# Patient Record
Sex: Female | Born: 1955
Health system: Southern US, Community
[De-identification: ages and names within clinical notes are randomized; demographics above are authoritative.]

## PROBLEM LIST (undated history)

## (undated) DIAGNOSIS — E785 Hyperlipidemia, unspecified: Secondary | ICD-10-CM

## (undated) DIAGNOSIS — I10 Essential (primary) hypertension: Secondary | ICD-10-CM

## (undated) DIAGNOSIS — F329 Major depressive disorder, single episode, unspecified: Secondary | ICD-10-CM

## (undated) DIAGNOSIS — Z9889 Other specified postprocedural states: Secondary | ICD-10-CM

## (undated) DIAGNOSIS — H269 Unspecified cataract: Secondary | ICD-10-CM

## (undated) DIAGNOSIS — T7840XA Allergy, unspecified, initial encounter: Secondary | ICD-10-CM

## (undated) DIAGNOSIS — E119 Type 2 diabetes mellitus without complications: Secondary | ICD-10-CM

## (undated) DIAGNOSIS — R112 Nausea with vomiting, unspecified: Secondary | ICD-10-CM

## (undated) DIAGNOSIS — M1990 Unspecified osteoarthritis, unspecified site: Secondary | ICD-10-CM

## (undated) DIAGNOSIS — Z8739 Personal history of other diseases of the musculoskeletal system and connective tissue: Secondary | ICD-10-CM

## (undated) DIAGNOSIS — F32A Depression, unspecified: Secondary | ICD-10-CM

## (undated) HISTORY — DX: Depression, unspecified: F32.A

## (undated) HISTORY — DX: Unspecified osteoarthritis, unspecified site: M19.90

## (undated) HISTORY — DX: Nausea with vomiting, unspecified: R11.2

## (undated) HISTORY — DX: Unspecified cataract: H26.9

## (undated) HISTORY — DX: Allergy, unspecified, initial encounter: T78.40XA

## (undated) HISTORY — PX: COLONOSCOPY: SHX174

## (undated) HISTORY — PX: APPENDECTOMY: SHX54

## (undated) HISTORY — DX: Major depressive disorder, single episode, unspecified: F32.9

## (undated) HISTORY — DX: Hyperlipidemia, unspecified: E78.5

## (undated) HISTORY — DX: Other specified postprocedural states: Z98.890

## (undated) HISTORY — DX: Personal history of other diseases of the musculoskeletal system and connective tissue: Z87.39

---

## 2003-01-01 ENCOUNTER — Encounter: Admission: RE | Admit: 2003-01-01 | Discharge: 2003-01-01 | Payer: Self-pay | Admitting: Internal Medicine

## 2003-01-01 ENCOUNTER — Encounter: Payer: Self-pay | Admitting: Internal Medicine

## 2003-09-01 ENCOUNTER — Ambulatory Visit (HOSPITAL_COMMUNITY): Admission: RE | Admit: 2003-09-01 | Discharge: 2003-09-01 | Payer: Self-pay | Admitting: Obstetrics and Gynecology

## 2004-02-06 ENCOUNTER — Emergency Department (HOSPITAL_COMMUNITY): Admission: EM | Admit: 2004-02-06 | Discharge: 2004-02-06 | Payer: Self-pay | Admitting: Emergency Medicine

## 2004-10-04 ENCOUNTER — Other Ambulatory Visit: Admission: RE | Admit: 2004-10-04 | Discharge: 2004-10-04 | Payer: Self-pay | Admitting: Obstetrics and Gynecology

## 2004-10-25 ENCOUNTER — Encounter: Admission: RE | Admit: 2004-10-25 | Discharge: 2004-10-25 | Payer: Self-pay | Admitting: Obstetrics and Gynecology

## 2005-04-12 ENCOUNTER — Ambulatory Visit (HOSPITAL_COMMUNITY): Admission: RE | Admit: 2005-04-12 | Discharge: 2005-04-12 | Payer: Self-pay | Admitting: Obstetrics and Gynecology

## 2007-12-02 ENCOUNTER — Other Ambulatory Visit: Admission: RE | Admit: 2007-12-02 | Discharge: 2007-12-02 | Payer: Self-pay | Admitting: Obstetrics and Gynecology

## 2011-01-05 NOTE — H&P (Signed)
NAME:  Paula Kelly, BAEZ NO.:  [id number]   MEDICAL RECORD NO.:  [id number]                   PATIENT TYPE:  AMB   LOCATION:  SDC                                  FACILITY:  [facility]   PHYSICIAN:  Artist Pais, M.D.                 DATE OF BIRTH:  November 24, 1955   DATE OF ADMISSION:  09/01/2003  DATE OF DISCHARGE:                                HISTORY & PHYSICAL   HISTORY OF PRESENT ILLNESS:  The patient is a 55 year old para 2, Caucasian  female, referred via the courtesy of Sharlet Salina, M.D. for menorrhagia.  Her cycles are every 28 days with a 7 to 8-day duration of flow.  The  bleeding is so significant that she soaks a super tampon plus a pad every  hour.  She also notes that she must change her tampon every hour and has  bled onto the bed.  She thinks this is normal because this has been  occurring for the last 10 years.  She subsequently had a workup for this  menorrhagia which included TSH which was normal at 1.89.  She subsequently  underwent a pelvic ultrasound which showed a right adnexal follicular cyst,  but a thick endometrium measuring 11.5 mm with an echogenic focus of 6 x 5  mm.  She subsequently underwent a sonohysterogram at which point, she was  found to have two polyps.  She was advised to undergo a D&C, hysteroscopy,  and polypectomy.  Risks of surgery including anesthetic complications,  hemorrhage, infection, and damage to adjacent structures including bladder,  bowel, blood vessels, or ureter were discussed with the patient. She was  made aware of the risks of uterine perforation which could result in  overwhelming, life threatening hemorrhage requiring emergent hysterectomy or  uterine perforation which could result in bowel damage requiring emergent  colostomy or which could result in overwhelming life threatening  peritonitis.  She expressed understanding of and acceptance of these risks  and desires to proceed with surgery.   OB/GYN HISTORY:  The patient uses vasectomy as contraception.   PAST MEDICAL HISTORY:  1. Hypertension.  2. Diabetes mellitus.  3. Seasonal allergies.  4. Hypercholesterolemia.  5. Depression.   ALLERGIES:  FLAGYL.   CURRENT MEDICATIONS:  1. Altace 10 mg daily.  2. Metformin b.i.d.  3. Wellbutrin XL 450 mg daily.  4. Acetylcarnitine.  5. Multivitamins.  6. Folic acid.  7. Aspirin.   PAST SURGICAL HISTORY:  D&C for polyps two years ago.   FAMILY HISTORY:  There is no family history of colon, breast, ovarian, or  prostate cancer. The patient's mother died at 74 of complications of  diabetes mellitus. She also had hypertension and heart disease.  Her father  is 67 with prostate and bladder cancer. He also has hypertension and heart  disease.  One brother died at age 34 of a CVA and another sister had a CVA  at  42 and is currently on disability.  She has two children ages 1 and 57,  both of whom have ADHD and one who is bipolar.   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  He husband is an Acupuncturist and he was transferred here.  The patient is a reformed smoker.  She drinks approximately one to two beers  per month.   REVIEW OF SYSTEMS:  Noncontributory except as noted above.  Denies headache,  visual changes, chest pain, shortness of breath, abdominal pain, change in  bowel habits, unintentional weight loss, dysuria, urgency, frequency,  vaginal pruritus or discharge, pain or bleeding with intercourse.   PHYSICAL EXAMINATION:  GENERAL:  A well-developed, Caucasian female who is  somewhat teary today because of her father's recent illness.  He has been  hospitalized.  VITAL SIGNS:  Blood pressure 140/74, heart rate 80, and weight 251.  HEENT:  Normal.  NECK:  Supple without thyromegaly, adenopathy, or nodules.  CHEST:  Clear to auscultation.  BREASTS:  Symmetric without mass.  No retraction or nipple discharge.   HEART:  Regular rate and rhythm without extra sounds or murmurs.  ABDOMEN:  Soft and nontender.  No hepatosplenomegaly or masses.  EXTREMITIES:  No cyanosis, clubbing, or edema.  NEUROLOGY:  Oriented x3.  Grossly normal.  PELVIC:  Normal external female genitalia.  No vulvar, vaginal, or cervical  lesions.  Pap smear performed on June 03, 2003, was within normal limits.  Bimanual examination reveals the uterus to be normal, mobile, and nontender  without any adnexal mass palpated.  RECTAL:  Negative.   ASSESSMENT:  The patient is a 55 year old para 2, Caucasian female with  significant menorrhagia and endometrial polyp admitted for a D&C,  hysteroscopy, and polypectomy.  She did see Dr. [name] today who gave her  clearance for surgery.  Her preoperative lab studies were performed and  found to be within normal limits.  Her glucose is slightly elevated at 108,  but she was not fasting.  All of her questions have been answered.  She  desires to proceed with surgery.                                               Artist Pais, M.D.    DC/MEDQ  D:  08/31/2003  T:  08/31/2003  Job:  [id number]

## 2011-01-05 NOTE — Op Note (Signed)
NAME:  Paula Kelly, Paula Kelly                     ACCOUNT NO.:  [id number]   MEDICAL RECORD NO.:  [id number]                   PATIENT TYPE:  AMB   LOCATION:  SDC                                  [facility]ILITY:  [facility]   PHYSICIAN:  Artist Pais, M.D.                 DATE OF BIRTH:  1956-01-21   DATE OF PROCEDURE:  09/01/2003  DATE OF DISCHARGE:                                 OPERATIVE REPORT   PREOPERATIVE DIAGNOSES:  1. Menorrhagia.  2. Polyps on sonohysterogram.   POSTOPERATIVE DIAGNOSES:  1. Menorrhagia.  2. Polyps on sonohysterogram.   PROCEDURES:  1. Dilatation and curettage.  2. Hysteroscopy unable to be completed.  3. Polypectomy.   SURGEON:  Artist Pais, M.D.   ANESTHESIA:  Monitored anesthesia care plus 20 mL 1% buffered lidocaine  paracervical block.   FLUIDS REPLACED:  1200 mL of crystalloid.   ESTIMATED BLOOD LOSS:  Minimal.   No drains.  No complications.   DESCRIPTION OF OPERATION:  The patient was brought to the operating room,  identified on the operating room table.  After the patient was adequately  sedated using monitored anesthesia care, she was placed in the dorsal  lithotomy position and prepped and draped in the usual sterile fashion.  Her  bladder was straight catheterized for approximately 100 mL of clear yellow  urine.  An examination under anesthesia revealed the uterus to be  anteverted, mobile, and normal size.  The speculum was placed and the  anterior lip of the cervix was infiltrated with 1 mL of 1% lidocaine.  The  lidocaine was buffered as per anesthesia request.  The remainder of the 19  mL of buffered lidocaine were then placed for a paracervical block.  Subsequently the cervix was very gently dilated up to a #25 Pratt dilator.  Dilatation proceeded very gently and smoothly to decrease the risk of  uterine perforation.  However, the patient's cervix was easily dilated.  I  then attempted to place the [facility] hysteroscope but was unable to get  it past  the lip of cervix close to the internal os, which protruded anteriorly.  I  subsequently attempted to under direct visualization but was still unable to  do so.  I tried this three times gently under direct guidance but was unable  to do so, and I also then proceeded to dilate her up to a #27 Pratt dilator,  again was unable to place the scope.  At that point it was decided to not  attempt placing the hysteroscope because I did not want to cause a  perforation.  Subsequently the uterus was curetted in a systematic clockwise  fashion with the polyps removed with the first two passes of the curette.  I  then curetted the uterus in a systematic clockwise fashion with copious  tissue obtained.  All of this was sent to pathology for examination.  The  Randall stone forceps were placed and  additional tissue was obtained.  The  uterus was again curetted and a good cry was heard all around, and no  additional tissue was obtained.  At that point the procedure was then  terminated.  The tenaculum was removed, and there was noted to be no  bleeding from the tenaculum site.  The patient tolerated the procedure well  without apparent complications and was transferred to the recovery room  after all instrument, sponge, and needle counts were correct.  The patient  was given a postop D&C instruction sheet, urged to call if she has heavy  bleeding enough to soak a pad an hour for three consecutive hours, to  refrain from intercourse for two weeks, and to return for the postop visit  in two weeks.  She will call if any problems.  She may take ibuprofen 400-  600 mg every six hours as needed for pain.                                               Artist Pais, M.D.    DC/MEDQ  D:  09/01/2003  T:  09/02/2003  Job:  [id number]   cc:   Sharlet Salina, M.D.  [address]  [place]  [place] [address]  Fax: [phone]

## 2011-08-21 HISTORY — PX: OTHER SURGICAL HISTORY: SHX169

## 2014-02-07 ENCOUNTER — Encounter (HOSPITAL_BASED_OUTPATIENT_CLINIC_OR_DEPARTMENT_OTHER): Payer: Self-pay | Admitting: Emergency Medicine

## 2014-02-07 ENCOUNTER — Emergency Department (HOSPITAL_BASED_OUTPATIENT_CLINIC_OR_DEPARTMENT_OTHER)
Admission: EM | Admit: 2014-02-07 | Discharge: 2014-02-07 | Disposition: A | Discharge status: Home / Self Care | Payer: Federal, State, Local not specified - PPO | Attending: Emergency Medicine | Admitting: Emergency Medicine

## 2014-02-07 ENCOUNTER — Emergency Department (HOSPITAL_BASED_OUTPATIENT_CLINIC_OR_DEPARTMENT_OTHER): Payer: Federal, State, Local not specified - PPO

## 2014-02-07 DIAGNOSIS — F17200 Nicotine dependence, unspecified, uncomplicated: Secondary | ICD-10-CM | POA: Insufficient documentation

## 2014-02-07 DIAGNOSIS — R296 Repeated falls: Secondary | ICD-10-CM | POA: Insufficient documentation

## 2014-02-07 DIAGNOSIS — Y9389 Activity, other specified: Secondary | ICD-10-CM | POA: Insufficient documentation

## 2014-02-07 DIAGNOSIS — S63501A Unspecified sprain of right wrist, initial encounter: Secondary | ICD-10-CM

## 2014-02-07 DIAGNOSIS — E119 Type 2 diabetes mellitus without complications: Secondary | ICD-10-CM | POA: Insufficient documentation

## 2014-02-07 DIAGNOSIS — Y9289 Other specified places as the place of occurrence of the external cause: Secondary | ICD-10-CM | POA: Insufficient documentation

## 2014-02-07 DIAGNOSIS — I10 Essential (primary) hypertension: Secondary | ICD-10-CM | POA: Insufficient documentation

## 2014-02-07 DIAGNOSIS — S63509A Unspecified sprain of unspecified wrist, initial encounter: Secondary | ICD-10-CM | POA: Insufficient documentation

## 2014-02-07 HISTORY — DX: Type 2 diabetes mellitus without complications: E11.9

## 2014-02-07 HISTORY — DX: Essential (primary) hypertension: I10

## 2014-02-07 MED ORDER — HYDROCODONE-ACETAMINOPHEN 5-325 MG PO TABS: 2.0000 | ORAL_TABLET | ORAL | Status: DC | PRN

## 2014-02-07 NOTE — ED Notes (Signed)
Pt reports falling earlier and catching herself with right wrist. Swelling noted.

## 2014-02-07 NOTE — ED Provider Notes (Signed)
CSN: [id number]     Arrival date & time 02/07/14  2011 History   First MD Initiated Contact with Patient 02/07/14 2143     Chief Complaint  Patient presents with  . Wrist Pain     (Consider location/radiation/quality/duration/timing/severity/associated sxs/prior Treatment) Patient is a 58 y.o. female presenting with wrist pain. The history is provided by the patient. No language interpreter was used.  Wrist Pain This is a new problem. The current episode started today. The problem occurs constantly. The problem has been gradually worsening. Associated symptoms include joint swelling and myalgias. Nothing aggravates the symptoms. The treatment provided moderate relief.   Pt reports she fell down a hill and landed on her wrist.  Pt complains of swelling and pain Past Medical History  Diagnosis Date  . Hypertension   . Diabetes mellitus without complication    Past Surgical History  Procedure Laterality Date  . Appendectomy     No family history on file. History  Substance Use Topics  . Smoking status: Current Every Day Smoker -- 1.00 packs/day    Types: Cigarettes  . Smokeless tobacco: Not on file  . Alcohol Use: No   OB History   Grav Para Term Preterm Abortions TAB SAB Ect Mult Living                 Review of Systems  Musculoskeletal: Positive for joint swelling and myalgias.  All other systems reviewed and are negative.     Allergies  Flagyl  Home Medications   Prior to Admission medications   Not on File   BP 161/64  Pulse 81  Temp(Src) 98.3 F (36.8 C) (Oral)  Resp 18  Ht 5\' 5"  (1.651 m)  Wt 217 lb (98.431 kg)  BMI 36.11 kg/m2  SpO2 98% Physical Exam  Constitutional: She is oriented to person, place, and time. She appears well-developed and well-nourished.  Musculoskeletal: She exhibits tenderness.  Decreased range of motion   nv and ns intact,   Tender scaphoid to palpation  Neurological: She is alert and oriented to person, place, and time. She  has normal reflexes.  Skin: Skin is warm.  Psychiatric: She has a normal mood and affect.    ED Course  Procedures (including critical care time) Labs Review Labs Reviewed - No data to display  Imaging Review Dg Wrist Complete Right  02/07/2014   CLINICAL DATA:  Right wrist pain after a fall.  EXAM: RIGHT WRIST - COMPLETE 3+ VIEW  COMPARISON:  None.  FINDINGS: There is no evidence of fracture or dislocation. There is no evidence of arthropathy or other focal bone abnormality. Dorsal soft tissue swelling. Soft tissues are otherwise unremarkable.  IMPRESSION: Negative.   Electronically Signed   By: Lucienne Capers M.D.   On: 02/07/2014 21:11     EKG Interpretation None      MDM   Final diagnoses:  Wrist sprain, right, initial encounter    See Dr. Daylene Katayama for recheck in 1 week.  [place], [place] 02/07/14 2212

## 2014-02-07 NOTE — Discharge Instructions (Signed)
Joint Sprain A sprain is a tear or stretch in the ligaments that hold a joint together. Severe sprains may need as long as 3-6 weeks of immobilization and/or exercises to heal completely. Sprained joints should be rested and protected. If not, they can become unstable and prone to re-injury. Proper treatment can reduce your pain, shorten the period of disability, and reduce the risk of repeated injuries. TREATMENT   Rest and elevate the injured joint to reduce pain and swelling.  Apply ice packs to the injury for 20-30 minutes every 2-3 hours for the next 2-3 days.  Keep the injury wrapped in a compression bandage or splint as long as the joint is painful or as instructed by your caregiver.  Do not use the injured joint until it is completely healed to prevent re-injury and chronic instability. Follow the instructions of your caregiver.  Long-term sprain management may require exercises and/or treatment by a physical therapist. Taping or special braces may help stabilize the joint until it is completely better. SEEK MEDICAL CARE IF:   You develop increased pain or swelling of the joint.  You develop increasing redness and warmth of the joint.  You develop a fever.  It becomes stiff.  Your hand or foot gets cold or numb. Document Released: 09/13/2004 Document Revised: 10/29/2011 Document Reviewed: 08/23/2008 [facility] Patient Information 2015 [place], [place]. This information is not intended to replace advice given to you by your health care provider. Make sure you discuss any questions you have with your health care provider.

## 2014-02-08 NOTE — ED Provider Notes (Signed)
Medical screening examination/treatment/procedure(s) were performed by non-physician practitioner and as supervising physician I was immediately available for consultation/collaboration.   EKG Interpretation None        Ezequiel Essex, MD 02/08/14 0025

## 2016-05-21 DIAGNOSIS — F17210 Nicotine dependence, cigarettes, uncomplicated: Secondary | ICD-10-CM | POA: Diagnosis not present

## 2016-05-21 DIAGNOSIS — E119 Type 2 diabetes mellitus without complications: Secondary | ICD-10-CM | POA: Diagnosis not present

## 2016-05-21 DIAGNOSIS — I10 Essential (primary) hypertension: Secondary | ICD-10-CM | POA: Diagnosis not present

## 2016-05-21 DIAGNOSIS — M94262 Chondromalacia, left knee: Secondary | ICD-10-CM | POA: Diagnosis not present

## 2016-05-21 DIAGNOSIS — S83282A Other tear of lateral meniscus, current injury, left knee, initial encounter: Secondary | ICD-10-CM | POA: Diagnosis not present

## 2016-05-21 DIAGNOSIS — Z7984 Long term (current) use of oral hypoglycemic drugs: Secondary | ICD-10-CM | POA: Diagnosis not present

## 2016-05-21 DIAGNOSIS — Z6837 Body mass index (BMI) 37.0-37.9, adult: Secondary | ICD-10-CM | POA: Diagnosis not present

## 2016-05-21 DIAGNOSIS — J449 Chronic obstructive pulmonary disease, unspecified: Secondary | ICD-10-CM | POA: Diagnosis not present

## 2016-05-21 DIAGNOSIS — M2242 Chondromalacia patellae, left knee: Secondary | ICD-10-CM | POA: Diagnosis not present

## 2016-05-21 DIAGNOSIS — E782 Mixed hyperlipidemia: Secondary | ICD-10-CM | POA: Diagnosis not present

## 2016-05-21 DIAGNOSIS — F329 Major depressive disorder, single episode, unspecified: Secondary | ICD-10-CM | POA: Diagnosis not present

## 2016-05-21 DIAGNOSIS — E669 Obesity, unspecified: Secondary | ICD-10-CM | POA: Diagnosis not present

## 2016-05-21 DIAGNOSIS — S83242A Other tear of medial meniscus, current injury, left knee, initial encounter: Secondary | ICD-10-CM | POA: Diagnosis not present

## 2016-05-21 DIAGNOSIS — S83207D Unspecified tear of unspecified meniscus, current injury, left knee, subsequent encounter: Secondary | ICD-10-CM | POA: Diagnosis not present

## 2016-05-21 DIAGNOSIS — Z8719 Personal history of other diseases of the digestive system: Secondary | ICD-10-CM | POA: Diagnosis not present

## 2016-05-21 DIAGNOSIS — Z7982 Long term (current) use of aspirin: Secondary | ICD-10-CM | POA: Diagnosis not present

## 2016-05-21 DIAGNOSIS — M2342 Loose body in knee, left knee: Secondary | ICD-10-CM | POA: Diagnosis not present

## 2016-05-21 DIAGNOSIS — E7800 Pure hypercholesterolemia, unspecified: Secondary | ICD-10-CM | POA: Diagnosis not present

## 2016-05-30 DIAGNOSIS — G5701 Lesion of sciatic nerve, right lower limb: Secondary | ICD-10-CM | POA: Diagnosis not present

## 2016-05-30 DIAGNOSIS — Z9889 Other specified postprocedural states: Secondary | ICD-10-CM | POA: Diagnosis not present

## 2016-05-30 DIAGNOSIS — M25562 Pain in left knee: Secondary | ICD-10-CM | POA: Diagnosis not present

## 2016-05-30 DIAGNOSIS — M222X2 Patellofemoral disorders, left knee: Secondary | ICD-10-CM | POA: Diagnosis not present

## 2016-06-05 DIAGNOSIS — M222X2 Patellofemoral disorders, left knee: Secondary | ICD-10-CM | POA: Diagnosis not present

## 2016-06-05 DIAGNOSIS — Z9889 Other specified postprocedural states: Secondary | ICD-10-CM | POA: Diagnosis not present

## 2016-06-05 DIAGNOSIS — G5701 Lesion of sciatic nerve, right lower limb: Secondary | ICD-10-CM | POA: Diagnosis not present

## 2016-06-05 DIAGNOSIS — M25562 Pain in left knee: Secondary | ICD-10-CM | POA: Diagnosis not present

## 2016-06-05 DIAGNOSIS — G8929 Other chronic pain: Secondary | ICD-10-CM | POA: Diagnosis not present

## 2016-06-14 DIAGNOSIS — G8929 Other chronic pain: Secondary | ICD-10-CM | POA: Diagnosis not present

## 2016-06-14 DIAGNOSIS — M25562 Pain in left knee: Secondary | ICD-10-CM | POA: Diagnosis not present

## 2016-06-14 DIAGNOSIS — Z9889 Other specified postprocedural states: Secondary | ICD-10-CM | POA: Diagnosis not present

## 2016-06-14 DIAGNOSIS — G5701 Lesion of sciatic nerve, right lower limb: Secondary | ICD-10-CM | POA: Diagnosis not present

## 2016-06-14 DIAGNOSIS — M222X2 Patellofemoral disorders, left knee: Secondary | ICD-10-CM | POA: Diagnosis not present

## 2016-06-21 DIAGNOSIS — Z9889 Other specified postprocedural states: Secondary | ICD-10-CM | POA: Diagnosis not present

## 2016-06-21 DIAGNOSIS — G5701 Lesion of sciatic nerve, right lower limb: Secondary | ICD-10-CM | POA: Diagnosis not present

## 2016-06-21 DIAGNOSIS — M222X2 Patellofemoral disorders, left knee: Secondary | ICD-10-CM | POA: Diagnosis not present

## 2016-06-21 DIAGNOSIS — M25562 Pain in left knee: Secondary | ICD-10-CM | POA: Diagnosis not present

## 2016-06-28 DIAGNOSIS — M25562 Pain in left knee: Secondary | ICD-10-CM | POA: Diagnosis not present

## 2016-06-28 DIAGNOSIS — M222X2 Patellofemoral disorders, left knee: Secondary | ICD-10-CM | POA: Diagnosis not present

## 2016-06-28 DIAGNOSIS — Z9889 Other specified postprocedural states: Secondary | ICD-10-CM | POA: Diagnosis not present

## 2016-06-28 DIAGNOSIS — G5701 Lesion of sciatic nerve, right lower limb: Secondary | ICD-10-CM | POA: Diagnosis not present

## 2016-08-06 DIAGNOSIS — N3941 Urge incontinence: Secondary | ICD-10-CM | POA: Diagnosis not present

## 2016-09-21 DIAGNOSIS — L820 Inflamed seborrheic keratosis: Secondary | ICD-10-CM | POA: Diagnosis not present

## 2016-09-21 DIAGNOSIS — B351 Tinea unguium: Secondary | ICD-10-CM | POA: Diagnosis not present

## 2016-10-03 DIAGNOSIS — N3941 Urge incontinence: Secondary | ICD-10-CM | POA: Diagnosis not present

## 2016-12-05 DIAGNOSIS — H43813 Vitreous degeneration, bilateral: Secondary | ICD-10-CM | POA: Diagnosis not present

## 2016-12-05 DIAGNOSIS — E119 Type 2 diabetes mellitus without complications: Secondary | ICD-10-CM | POA: Diagnosis not present

## 2016-12-05 DIAGNOSIS — H04123 Dry eye syndrome of bilateral lacrimal glands: Secondary | ICD-10-CM | POA: Diagnosis not present

## 2016-12-05 DIAGNOSIS — H2513 Age-related nuclear cataract, bilateral: Secondary | ICD-10-CM | POA: Diagnosis not present

## 2017-01-10 DIAGNOSIS — E782 Mixed hyperlipidemia: Secondary | ICD-10-CM | POA: Diagnosis not present

## 2017-01-10 DIAGNOSIS — F17210 Nicotine dependence, cigarettes, uncomplicated: Secondary | ICD-10-CM | POA: Diagnosis not present

## 2017-01-10 DIAGNOSIS — M797 Fibromyalgia: Secondary | ICD-10-CM | POA: Diagnosis not present

## 2017-01-10 DIAGNOSIS — I10 Essential (primary) hypertension: Secondary | ICD-10-CM | POA: Diagnosis not present

## 2017-01-23 DIAGNOSIS — E782 Mixed hyperlipidemia: Secondary | ICD-10-CM | POA: Diagnosis not present

## 2017-01-23 DIAGNOSIS — E119 Type 2 diabetes mellitus without complications: Secondary | ICD-10-CM | POA: Diagnosis not present

## 2017-01-23 DIAGNOSIS — I10 Essential (primary) hypertension: Secondary | ICD-10-CM | POA: Diagnosis not present

## 2017-01-23 DIAGNOSIS — H109 Unspecified conjunctivitis: Secondary | ICD-10-CM | POA: Diagnosis not present

## 2017-01-23 DIAGNOSIS — R002 Palpitations: Secondary | ICD-10-CM | POA: Diagnosis not present

## 2017-04-17 DIAGNOSIS — Z23 Encounter for immunization: Secondary | ICD-10-CM | POA: Diagnosis not present

## 2017-04-17 DIAGNOSIS — E782 Mixed hyperlipidemia: Secondary | ICD-10-CM | POA: Diagnosis not present

## 2017-04-17 DIAGNOSIS — E119 Type 2 diabetes mellitus without complications: Secondary | ICD-10-CM | POA: Diagnosis not present

## 2017-04-17 DIAGNOSIS — I10 Essential (primary) hypertension: Secondary | ICD-10-CM | POA: Diagnosis not present

## 2017-04-17 DIAGNOSIS — M797 Fibromyalgia: Secondary | ICD-10-CM | POA: Diagnosis not present

## 2017-06-24 DIAGNOSIS — E119 Type 2 diabetes mellitus without complications: Secondary | ICD-10-CM | POA: Diagnosis not present

## 2017-06-24 DIAGNOSIS — I10 Essential (primary) hypertension: Secondary | ICD-10-CM | POA: Diagnosis not present

## 2017-06-24 DIAGNOSIS — M797 Fibromyalgia: Secondary | ICD-10-CM | POA: Diagnosis not present

## 2017-06-24 DIAGNOSIS — M150 Primary generalized (osteo)arthritis: Secondary | ICD-10-CM | POA: Diagnosis not present

## 2017-07-24 ENCOUNTER — Ambulatory Visit: Payer: Federal, State, Local not specified - PPO | Admitting: Family Medicine

## 2017-07-25 ENCOUNTER — Encounter: Payer: Self-pay | Admitting: Family Medicine

## 2017-07-25 ENCOUNTER — Encounter: Payer: Self-pay | Admitting: Gastroenterology

## 2017-07-25 ENCOUNTER — Ambulatory Visit: Payer: Federal, State, Local not specified - PPO | Admitting: Family Medicine

## 2017-07-25 VITALS — BP 136/70 | HR 104 | Temp 98.8°F | Ht 65.0 in | Wt 215.2 lb

## 2017-07-25 DIAGNOSIS — Z1211 Encounter for screening for malignant neoplasm of colon: Secondary | ICD-10-CM

## 2017-07-25 DIAGNOSIS — E7800 Pure hypercholesterolemia, unspecified: Secondary | ICD-10-CM

## 2017-07-25 DIAGNOSIS — M2550 Pain in unspecified joint: Secondary | ICD-10-CM

## 2017-07-25 DIAGNOSIS — Z122 Encounter for screening for malignant neoplasm of respiratory organs: Secondary | ICD-10-CM | POA: Diagnosis not present

## 2017-07-25 DIAGNOSIS — Z1239 Encounter for other screening for malignant neoplasm of breast: Secondary | ICD-10-CM

## 2017-07-25 DIAGNOSIS — I10 Essential (primary) hypertension: Secondary | ICD-10-CM

## 2017-07-25 DIAGNOSIS — E119 Type 2 diabetes mellitus without complications: Secondary | ICD-10-CM

## 2017-07-25 DIAGNOSIS — F901 Attention-deficit hyperactivity disorder, predominantly hyperactive type: Secondary | ICD-10-CM

## 2017-07-25 DIAGNOSIS — Z1159 Encounter for screening for other viral diseases: Secondary | ICD-10-CM | POA: Insufficient documentation

## 2017-07-25 DIAGNOSIS — Z789 Other specified health status: Secondary | ICD-10-CM | POA: Diagnosis not present

## 2017-07-25 DIAGNOSIS — Z23 Encounter for immunization: Secondary | ICD-10-CM | POA: Insufficient documentation

## 2017-07-25 DIAGNOSIS — Z0000 Encounter for general adult medical examination without abnormal findings: Secondary | ICD-10-CM | POA: Insufficient documentation

## 2017-07-25 DIAGNOSIS — Z1231 Encounter for screening mammogram for malignant neoplasm of breast: Secondary | ICD-10-CM | POA: Diagnosis not present

## 2017-07-25 DIAGNOSIS — Z720 Tobacco use: Secondary | ICD-10-CM | POA: Insufficient documentation

## 2017-07-25 DIAGNOSIS — F419 Anxiety disorder, unspecified: Secondary | ICD-10-CM | POA: Diagnosis not present

## 2017-07-25 DIAGNOSIS — F331 Major depressive disorder, recurrent, moderate: Secondary | ICD-10-CM | POA: Diagnosis not present

## 2017-07-25 MED ORDER — AMPHETAMINE-DEXTROAMPHET ER 20 MG PO CP24: ORAL_CAPSULE | ORAL | 0 refills | Status: DC

## 2017-07-25 MED ORDER — CELECOXIB 200 MG PO CAPS: 200.0000 mg | ORAL_CAPSULE | Freq: Every day | ORAL | 1 refills | Status: DC

## 2017-07-25 NOTE — Progress Notes (Signed)
Subjective:  Patient ID: Paula Kelly, female    DOB: [birth year]  Age: 61 y.o. MRN: [id number]  CC: Establish Care   HPI Paula Kelly presents for establishment of care and treatment of for multiple medical issues.  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  She lives with her 92 year old daughter and her daughter's 77-year-old child.  She says that her daughter has bipolar disorder and inflammatory bowel disease.  Her daughter recently completed nursing school and is interested in wound care.  Patient's 49 year old son and his girlfriend also lives with her.  Her son is in school for Librarian, academic.  She has 2 siblings that have both had strokes.  Patient smokes 1 pack of cigarettes a day for 30 years.  She does that she needs to quit but so at this point.  She rarely drinks alcohol.  She has a long-standing history of ADHD.  She has been taking stimulants for many years she tells me.  She says that she is going for evaluation of her ADHD by a psychologist this afternoon.  She has multiple joint aches and pains to include: Her neck, her hands, her knees, and her.  She has taken Celebrex for this with success.  She is aware now black box warning with Celebrex.  She has a history of hypertension well controlled diabetes she tells me.  Her cholesterol has been slightly elevated.  I told her that we are more likely to treat her cholesterol along with her history of diabetes.  Her Pap smears through GYN.  She is due for a mammogram and a colonoscopy.  She has never had a low-dose CT scan of her chest.  She is active physically.  History Paula Kelly has a past medical history of Diabetes mellitus without complication ([place]) and Hypertension.   She has a past surgical history that includes Appendectomy.   Her family history is not on file.She reports that she has been smoking cigarettes.  She has been smoking about 1.00 pack per day. she has never used smokeless tobacco. She  reports that she does not drink alcohol or use drugs.  Outpatient Medications Prior to Visit  Medication Sig Dispense Refill  . aspirin EC 81 MG tablet Take 1 tablet by mouth daily.    Marland Kitchen atorvastatin (LIPITOR) 40 MG tablet Take 1 tablet by mouth daily.    . candesartan (ATACAND) 32 MG tablet Take 1 tablet by mouth daily.    . Cholecalciferol (VITAMIN D-1000 MAX ST) 1000 units tablet Take 1 tablet by mouth daily.    . Coenzyme Q-10 200 MG CAPS 1-2 capsules daily for cardiovascular health    . DOCOSAHEXAENOIC ACID PO Take 1 g by mouth daily.    . DULoxetine (CYMBALTA) 60 MG capsule Take 2 capsules by mouth daily.    Marland Kitchen glucose blood (FREESTYLE LITE) test strip Test daily and prn symptoms (Type 2 diabetes)    . hydrochlorothiazide (HYDRODIURIL) 25 MG tablet Take 1 tablet by mouth daily.    . metFORMIN (GLUCOPHAGE) 500 MG tablet Take 1 tablet by mouth 2 (two) times daily.    . Multiple Vitamin (MULTI-VITAMINS) TABS Take 1 tablet by mouth daily.    . vitamin E 400 UNIT capsule Take 1 capsule by mouth daily.    Marland Kitchen amphetamine-dextroamphetamine (ADDERALL XR) 20 MG 24 hr capsule Take 2 capsules (40 mg total) by mouth every morning.    Marland Kitchen HYDROcodone-acetaminophen (NORCO/VICODIN) 5-325 MG per tablet Take 2 tablets by  mouth every 4 (four) hours as needed. 20 tablet 0   No facility-administered medications prior to visit.     ROS Review of Systems  Constitutional: Negative for diaphoresis, fatigue, fever and unexpected weight change.  HENT: Negative.   Eyes: Negative.   Respiratory: Negative.   Cardiovascular: Negative.   Gastrointestinal: Negative.   Endocrine: Negative for polyphagia and polyuria.  Genitourinary: Negative for frequency.  Musculoskeletal: Positive for arthralgias and neck pain.  Skin: Negative.   Allergic/Immunologic: Negative for immunocompromised state.  Neurological: Negative for weakness and headaches.  Hematological: Does not bruise/bleed easily.  Psychiatric/Behavioral:  Negative for agitation, behavioral problems and confusion.    Objective:  BP 136/70 (BP Location: Left Arm, Patient Position: Sitting, Cuff Size: Normal)   Pulse (!) 104   Temp 98.8 F (37.1 C) (Oral)   Ht 5\' 5"  (1.651 m)   Wt 215 lb 4 oz (97.6 kg)   SpO2 97%   BMI 35.82 kg/m   Physical Exam  Constitutional: She is oriented to person, place, and time. She appears well-developed and well-nourished. No distress.  HENT:  Head: Normocephalic and atraumatic.  Right Ear: External ear normal.  Left Ear: External ear normal.  Mouth/Throat: Oropharynx is clear and moist. No oropharyngeal exudate.  Eyes: Conjunctivae are normal. Pupils are equal, round, and reactive to light. Right eye exhibits no discharge. Left eye exhibits no discharge. No scleral icterus.  Neck: Neck supple. No JVD present. No tracheal deviation present. No thyromegaly present.  Cardiovascular: Normal rate, regular rhythm and normal heart sounds.  Pulmonary/Chest: Effort normal and breath sounds normal. No stridor.  Abdominal: Bowel sounds are normal.  Lymphadenopathy:    She has no cervical adenopathy.  Neurological: She is alert and oriented to person, place, and time.  Skin: Skin is warm and dry. She is not diaphoretic.  Psychiatric: She has a normal mood and affect. Her behavior is normal.  Nursing note and vitals reviewed.     Assessment & Plan:   Paula Kelly was seen today for establish care.  Diagnoses and all orders for this visit:  Attention deficit hyperactivity disorder (ADHD), predominantly hyperactive type -     amphetamine-dextroamphetamine (ADDERALL XR) 20 MG 24 hr capsule; Take 2 capsules (40 mg total) by mouth every morning.  Essential hypertension -     CBC; Future -     TSH; Future -     Urinalysis, Routine w reflex microscopic; Future  Controlled type 2 diabetes mellitus without complication, without long-term current use of insulin (HCC) -     Hemoglobin A1c; Future -     Lipid panel;  Future -     Urinalysis, Routine w reflex microscopic; Future -     Microalbumin / creatinine urine ratio; Future  Elevated cholesterol  Screen for colon cancer -     Ambulatory referral to Gastroenterology  Tobacco abuse  Encounter for screening for lung cancer -     CT CHEST LUNG CA SCREEN LOW DOSE W/O CM; Future  Encounter for hepatitis C screening test for low risk patient -     Hepatitis C antibody; Future  Health maintenance alteration -     VITAMIN D 25 Hydroxy (Vit-D Deficiency, Fractures); Future  Screening for breast cancer -     MM Digital Screening; Future  Tobacco use -     CT CHEST LUNG CA SCREEN LOW DOSE W/O CM; Future  Need for 23-polyvalent pneumococcal polysaccharide vaccine -     Pneumococcal polysaccharide vaccine 23-valent greater than  or equal to 2yo subcutaneous/IM  Multiple joint pain  Other orders -     Discontinue: amphetamine-dextroamphetamine (ADDERALL XR) 20 MG 24 hr capsule; Take 2 capsules (40 mg total) by mouth every morning. -     Discontinue: amphetamine-dextroamphetamine (ADDERALL XR) 20 MG 24 hr capsule; Take 2 capsules (40 mg total) by mouth every morning. -     Discontinue: amphetamine-dextroamphetamine (ADDERALL XR) 20 MG 24 hr capsule; Take 2 capsules (40 mg total) by mouth every morning.   I have discontinued Benjamine Mola B. Hopke's HYDROcodone-acetaminophen. I am also having her maintain her aspirin EC, atorvastatin, candesartan, Cholecalciferol, Coenzyme Q-10, DOCOSAHEXAENOIC ACID PO, DULoxetine, hydrochlorothiazide, glucose blood, metFORMIN, MULTI-VITAMINS, vitamin E, and amphetamine-dextroamphetamine.  Meds ordered this encounter  Medications  . DISCONTD: amphetamine-dextroamphetamine (ADDERALL XR) 20 MG 24 hr capsule    Sig: Take 2 capsules (40 mg total) by mouth every morning.    Dispense:  60 capsule    Refill:  0    Fill date: 12.6.18  . DISCONTD: amphetamine-dextroamphetamine (ADDERALL XR) 20 MG 24 hr capsule    Sig: Take  2 capsules (40 mg total) by mouth every morning.    Dispense:  60 capsule    Refill:  0    Fill date: 1.6.18  . DISCONTD: amphetamine-dextroamphetamine (ADDERALL XR) 20 MG 24 hr capsule    Sig: Take 2 capsules (40 mg total) by mouth every morning.    Dispense:  60 capsule    Refill:  0    Fill date: 2.6.18  . amphetamine-dextroamphetamine (ADDERALL XR) 20 MG 24 hr capsule    Sig: Take 2 capsules (40 mg total) by mouth every morning.    Dispense:  60 capsule    Refill:  0    Fill date: 2.6.18   Of course I advised her to stop smoking.  I told her it is never too late to quit.  I have ordered a low-dose CT of her chest.  I reviewed the notes of her last clinic visit with her former provider.  I do agree with the ADHD eval clinical psychologist today.  I also agree with psychiatric management if she goes on to develop additional psychiatric comorbidities.  Follow-up: Return in about 3 months (around 10/23/2017).  Libby Maw, MD

## 2017-07-29 ENCOUNTER — Other Ambulatory Visit: Payer: Federal, State, Local not specified - PPO

## 2017-07-31 ENCOUNTER — Ambulatory Visit: Payer: Federal, State, Local not specified - PPO

## 2017-07-31 VITALS — Ht 65.0 in | Wt 218.4 lb

## 2017-07-31 DIAGNOSIS — Z1211 Encounter for screening for malignant neoplasm of colon: Secondary | ICD-10-CM

## 2017-07-31 MED ORDER — PEG-KCL-NACL-NASULF-NA ASC-C 140 G PO SOLR: 1.0000 | Freq: Once | ORAL | Status: AC

## 2017-07-31 NOTE — Progress Notes (Signed)
Per pt, no allergies to soy or egg products.Pt not taking any weight loss meds or using  O2 at home.  Pt refused emmi video. 

## 2017-08-06 ENCOUNTER — Ambulatory Visit (HOSPITAL_BASED_OUTPATIENT_CLINIC_OR_DEPARTMENT_OTHER)
Admission: RE | Admit: 2017-08-06 | Discharge: 2017-08-06 | Disposition: A | Discharge status: Home / Self Care | Payer: Federal, State, Local not specified - PPO | Source: Ambulatory Visit | Attending: Family Medicine | Admitting: Family Medicine

## 2017-08-06 DIAGNOSIS — F17210 Nicotine dependence, cigarettes, uncomplicated: Secondary | ICD-10-CM | POA: Insufficient documentation

## 2017-08-06 DIAGNOSIS — R918 Other nonspecific abnormal finding of lung field: Secondary | ICD-10-CM | POA: Diagnosis not present

## 2017-08-06 DIAGNOSIS — Z87891 Personal history of nicotine dependence: Secondary | ICD-10-CM | POA: Diagnosis not present

## 2017-08-06 DIAGNOSIS — I700 Atherosclerosis of aorta: Secondary | ICD-10-CM | POA: Insufficient documentation

## 2017-08-06 DIAGNOSIS — Z1231 Encounter for screening mammogram for malignant neoplasm of breast: Secondary | ICD-10-CM | POA: Diagnosis not present

## 2017-08-06 DIAGNOSIS — J432 Centrilobular emphysema: Secondary | ICD-10-CM | POA: Insufficient documentation

## 2017-08-06 DIAGNOSIS — Z720 Tobacco use: Secondary | ICD-10-CM

## 2017-08-06 DIAGNOSIS — Z122 Encounter for screening for malignant neoplasm of respiratory organs: Secondary | ICD-10-CM

## 2017-08-06 DIAGNOSIS — Z1239 Encounter for other screening for malignant neoplasm of breast: Secondary | ICD-10-CM

## 2017-08-07 ENCOUNTER — Telehealth: Payer: Self-pay | Admitting: Gastroenterology

## 2017-08-07 ENCOUNTER — Telehealth: Payer: Self-pay | Admitting: Family Medicine

## 2017-08-07 NOTE — Telephone Encounter (Signed)
Explained to patient that we do not do prior auth. Offered coupon, she will call us back if she needs the coupon.

## 2017-08-07 NOTE — Telephone Encounter (Signed)
Copied from [place]. Topic: Quick Communication - See Telephone Encounter >> Aug 07, 2017 11:06 AM Vernona Rieger wrote: CRM for notification. See Telephone encounter for:   08/07/17.  Jenna from [facility] called and they need to know if the patient smoked 30 plus years, pt said it was 25 years, if it is not 30 plus years the insurance will not cover the scan. Radiologist wants the MD to verify the smoking history. It would need to be changed to Chest CT without Contrast. Contact [phone]. Fax Number is [phone].

## 2017-08-07 NOTE — Telephone Encounter (Signed)
Dr. Ethelene Hal - patient is saying that she has only smoked for 25 years, the insurance will not cover the scan.

## 2017-08-09 ENCOUNTER — Telehealth: Payer: Self-pay | Admitting: *Deleted

## 2017-08-09 ENCOUNTER — Encounter: Payer: Self-pay | Admitting: Gastroenterology

## 2017-08-09 NOTE — Telephone Encounter (Signed)
Pt states prep is 85$-   She cannot afford-  Paula Kelly told pt she can get a sample before I was ablr to offer a coupon- pt will pick up today 4th floor desk  plenvu sample- lot [id number]  Exp 01-2019 as directed   [name] PV

## 2017-08-12 ENCOUNTER — Ambulatory Visit (AMBULATORY_SURGERY_CENTER): Payer: Federal, State, Local not specified - PPO | Admitting: Gastroenterology

## 2017-08-12 ENCOUNTER — Encounter: Payer: Self-pay | Admitting: Gastroenterology

## 2017-08-12 VITALS — BP 133/57 | HR 79 | Temp 96.8°F | Resp 13 | Ht 65.0 in | Wt 218.0 lb

## 2017-08-12 DIAGNOSIS — D123 Benign neoplasm of transverse colon: Secondary | ICD-10-CM

## 2017-08-12 DIAGNOSIS — Z1211 Encounter for screening for malignant neoplasm of colon: Secondary | ICD-10-CM | POA: Diagnosis not present

## 2017-08-12 MED ORDER — SODIUM CHLORIDE 0.9 % IV SOLN: 500.0000 mL | INTRAVENOUS | Status: DC

## 2017-08-12 NOTE — Progress Notes (Signed)
Called to room to assist during endoscopic procedure.  Patient ID and intended procedure confirmed with present staff. Received instructions for my participation in the procedure from the performing physician.  

## 2017-08-12 NOTE — Progress Notes (Signed)
A/ox3 pleased with MAC, report to RN 

## 2017-08-12 NOTE — Patient Instructions (Signed)
**Handouts given on Polyps and Diverticulosis**  YOU HAD AN ENDOSCOPIC PROCEDURE TODAY AT THE [facility]:   Refer to the procedure report that was given to you for any specific questions about what was found during the examination.  If the procedure report does not answer your questions, please call your gastroenterologist to clarify.  If you requested that your care partner not be given the details of your procedure findings, then the procedure report has been included in a sealed envelope for you to review at your convenience later.  YOU SHOULD EXPECT: Some feelings of bloating in the abdomen. Passage of more gas than usual.  Walking can help get rid of the air that was put into your GI tract during the procedure and reduce the bloating. If you had a lower endoscopy (such as a colonoscopy or flexible sigmoidoscopy) you may notice spotting of blood in your stool or on the toilet paper. If you underwent a bowel prep for your procedure, you may not have a normal bowel movement for a few days.  Please Note:  You might notice some irritation and congestion in your nose or some drainage.  This is from the oxygen used during your procedure.  There is no need for concern and it should clear up in a day or so.  SYMPTOMS TO REPORT IMMEDIATELY:   Following lower endoscopy (colonoscopy or flexible sigmoidoscopy):  Excessive amounts of blood in the stool  Significant tenderness or worsening of abdominal pains  Swelling of the abdomen that is new, acute  Fever of 100F or higher   Following upper endoscopy (EGD)  Vomiting of blood or coffee ground material  New chest pain or pain under the shoulder blades  Painful or persistently difficult swallowing  New shortness of breath  Fever of 100F or higher  Black, tarry-looking stools  For urgent or emergent issues, a gastroenterologist can be reached at any hour by calling [phone].   DIET:  We do recommend a small meal at first, but  then you may proceed to your regular diet.  Drink plenty of fluids but you should avoid alcoholic beverages for 24 hours.  ACTIVITY:  You should plan to take it easy for the rest of today and you should NOT DRIVE or use heavy machinery until tomorrow (because of the sedation medicines used during the test).    FOLLOW UP: Our staff will call the number listed on your records the next business day following your procedure to check on you and address any questions or concerns that you may have regarding the information given to you following your procedure. If we do not reach you, we will leave a message.  However, if you are feeling well and you are not experiencing any problems, there is no need to return our call.  We will assume that you have returned to your regular daily activities without incident.  If any biopsies were taken you will be contacted by phone or by letter within the next 1-3 weeks.  Please call us at [phone] if you have not heard about the biopsies in 3 weeks.    SIGNATURES/CONFIDENTIALITY: You and/or your care partner have signed paperwork which will be entered into your electronic medical record.  These signatures attest to the fact that that the information above on your After Visit Summary has been reviewed and is understood.  Full responsibility of the confidentiality of this discharge information lies with you and/or your care-partner.YOU HAD AN ENDOSCOPIC PROCEDURE TODAY  AT [facility]:   Refer to the procedure report that was given to you for any specific questions about what was found during the examination.  If the procedure report does not answer your questions, please call your gastroenterologist to clarify.  If you requested that your care partner not be given the details of your procedure findings, then the procedure report has been included in a sealed envelope for you to review at your convenience later.  YOU SHOULD EXPECT: Some feelings of  bloating in the abdomen. Passage of more gas than usual.  Walking can help get rid of the air that was put into your GI tract during the procedure and reduce the bloating. If you had a lower endoscopy (such as a colonoscopy or flexible sigmoidoscopy) you may notice spotting of blood in your stool or on the toilet paper. If you underwent a bowel prep for your procedure, you may not have a normal bowel movement for a few days.  Please Note:  You might notice some irritation and congestion in your nose or some drainage.  This is from the oxygen used during your procedure.  There is no need for concern and it should clear up in a day or so.  SYMPTOMS TO REPORT IMMEDIATELY:   Following lower endoscopy (colonoscopy or flexible sigmoidoscopy):  Excessive amounts of blood in the stool  Significant tenderness or worsening of abdominal pains  Swelling of the abdomen that is new, acute  Fever of 100F or higher   For urgent or emergent issues, a gastroenterologist can be reached at any hour by calling [phone].   DIET:  We do recommend a small meal at first, but then you may proceed to your regular diet.  Drink plenty of fluids but you should avoid alcoholic beverages for 24 hours.  ACTIVITY:  You should plan to take it easy for the rest of today and you should NOT DRIVE or use heavy machinery until tomorrow (because of the sedation medicines used during the test).    FOLLOW UP: Our staff will call the number listed on your records the next business day following your procedure to check on you and address any questions or concerns that you may have regarding the information given to you following your procedure. If we do not reach you, we will leave a message.  However, if you are feeling well and you are not experiencing any problems, there is no need to return our call.  We will assume that you have returned to your regular daily activities without incident.  If any biopsies were taken you will  be contacted by phone or by letter within the next 1-3 weeks.  Please call us at [phone] if you have not heard about the biopsies in 3 weeks.    SIGNATURES/CONFIDENTIALITY: You and/or your care partner have signed paperwork which will be entered into your electronic medical record.  These signatures attest to the fact that that the information above on your After Visit Summary has been reviewed and is understood.  Full responsibility of the confidentiality of this discharge information lies with you and/or your care-partner.

## 2017-08-12 NOTE — Op Note (Signed)
[facility] Patient Name: Paula Kelly Procedure Date: 08/12/2017 7:49 AM MRN: [id number] Endoscopist: Mauri Pole , MD Age: 61 Referring MD:  Date of Birth: 07/07/1956 Gender: Female Account #: [id number] Procedure:                Colonoscopy Indications:              Screening for colorectal malignant neoplasm Medicines:                Monitored Anesthesia Care Procedure:                Pre-Anesthesia Assessment:                           - Prior to the procedure, a History and Physical                            was performed, and patient medications and                            allergies were reviewed. The patient's tolerance of                            previous anesthesia was also reviewed. The risks                            and benefits of the procedure and the sedation                            options and risks were discussed with the patient.                            All questions were answered, and informed consent                            was obtained. Prior Anticoagulants: The patient has                            taken no previous anticoagulant or antiplatelet                            agents. ASA Grade Assessment: II - A patient with                            mild systemic disease. After reviewing the risks                            and benefits, the patient was deemed in                            satisfactory condition to undergo the procedure.                           After obtaining informed consent, the colonoscope  was passed under direct vision. Throughout the                            procedure, the patient's blood pressure, pulse, and                            oxygen saturations were monitored continuously. The                            Model [id number]-[id number] scope was introduced                            through the anus and advanced to the the cecum,   identified by appendiceal orifice and ileocecal                            valve. The colonoscopy was performed without                            difficulty. The patient tolerated the procedure                            well. The quality of the bowel preparation was                            excellent. The ileocecal valve, appendiceal                            orifice, and rectum were photographed. Scope In: 8:17:06 AM Scope Out: 8:31:26 AM Scope Withdrawal Time: 0 hours 10 minutes 32 seconds  Total Procedure Duration: 0 hours 14 minutes 20 seconds  Findings:                 The perianal and digital rectal examinations were                            normal.                           A 5 mm polyp was found in the transverse colon. The                            polyp was sessile. The polyp was removed with a                            cold snare. Resection and retrieval were complete.                           Multiple small-mouthed diverticula were found in                            the sigmoid colon and descending colon.                           Non-bleeding internal hemorrhoids were found during  retroflexion. The hemorrhoids were small.                           The exam was otherwise without abnormality. Complications:            No immediate complications. Estimated Blood Loss:     Estimated blood loss was minimal. Impression:               - One 5 mm polyp in the transverse colon, removed                            with a cold snare. Resected and retrieved.                           - Diverticulosis in the sigmoid colon and in the                            descending colon.                           - Non-bleeding internal hemorrhoids.                           - The examination was otherwise normal. Recommendation:           - Patient has a contact number available for                            emergencies. The signs and symptoms of potential                             delayed complications were discussed with the                            patient. Return to normal activities tomorrow.                            Written discharge instructions were provided to the                            patient.                           - Resume previous diet.                           - Continue present medications.                           - Await pathology results.                           - Repeat colonoscopy in 5-10 years for surveillance                            based on pathology results. Mauri Pole, MD 08/12/2017 8:37:05 AM This report has been signed electronically.

## 2017-08-15 ENCOUNTER — Telehealth: Payer: Self-pay

## 2017-08-15 NOTE — Telephone Encounter (Signed)
  Follow up Call-  Call back number 08/12/2017  Post procedure Call Back phone  # [phone]  Permission to leave phone message Yes  Some recent data might be hidden     Patient questions:  Do you have a fever, pain , or abdominal swelling? No. Pain Score  0 *  Have you tolerated food without any problems? Yes.    Have you been able to return to your normal activities? Yes.    Do you have any questions about your discharge instructions: Diet   No. Medications  No. Follow up visit  No.  Do you have questions or concerns about your Care? No.  Actions: * If pain score is 4 or above: No action needed, pain <4.

## 2017-08-26 ENCOUNTER — Encounter: Payer: Self-pay | Admitting: Gastroenterology

## 2017-09-24 ENCOUNTER — Telehealth: Payer: Self-pay | Admitting: Family Medicine

## 2017-09-24 NOTE — Telephone Encounter (Signed)
I called and spoke with patient. Patient is needing a prior authorization done for her last refill of her Adderall that she received from [place] during her visit in December. I advised patient I would start the PA and give her a call once I hear from the insurance. PA started via covermymeds.

## 2017-09-24 NOTE — Telephone Encounter (Signed)
Copied from [facility] [id number]. Topic: Quick Communication - See Telephone Encounter >> Sep 24, 2017  4:09 PM Ether Griffins B wrote: CRM for notification. See Telephone encounter for:  Pt having trouble getting a refill on adderall due to the physiatrist not being able to prescribe them. Her PA on the adderall runs out on 10/06/17. The physiatrist she originally saw has left the office. She cant get in with one until after the PA expires. She is needing a suggestion as one who can refill this for her. The third refill that was supplied back in Dec will need a new pre auth for her to get that filled as well.  09/24/17.

## 2017-09-25 NOTE — Telephone Encounter (Signed)
Insurance has approved the Adderall XR 20 mg capsules through 09/25/2018. I called and spoke with patient and she is aware.

## 2017-10-07 ENCOUNTER — Other Ambulatory Visit (HOSPITAL_BASED_OUTPATIENT_CLINIC_OR_DEPARTMENT_OTHER): Payer: Self-pay | Admitting: Urology

## 2017-10-07 DIAGNOSIS — N3941 Urge incontinence: Secondary | ICD-10-CM | POA: Diagnosis not present

## 2017-10-07 DIAGNOSIS — N959 Unspecified menopausal and perimenopausal disorder: Secondary | ICD-10-CM

## 2017-10-07 DIAGNOSIS — Z124 Encounter for screening for malignant neoplasm of cervix: Secondary | ICD-10-CM | POA: Diagnosis not present

## 2017-10-07 DIAGNOSIS — Z01411 Encounter for gynecological examination (general) (routine) with abnormal findings: Secondary | ICD-10-CM | POA: Diagnosis not present

## 2017-10-10 ENCOUNTER — Ambulatory Visit (HOSPITAL_BASED_OUTPATIENT_CLINIC_OR_DEPARTMENT_OTHER)
Admission: RE | Admit: 2017-10-10 | Discharge: 2017-10-10 | Disposition: A | Discharge status: Home / Self Care | Payer: Federal, State, Local not specified - PPO | Source: Ambulatory Visit | Attending: Urology | Admitting: Urology

## 2017-10-10 DIAGNOSIS — N959 Unspecified menopausal and perimenopausal disorder: Secondary | ICD-10-CM | POA: Insufficient documentation

## 2017-10-10 DIAGNOSIS — F900 Attention-deficit hyperactivity disorder, predominantly inattentive type: Secondary | ICD-10-CM | POA: Diagnosis not present

## 2017-10-10 DIAGNOSIS — Z780 Asymptomatic menopausal state: Secondary | ICD-10-CM | POA: Diagnosis not present

## 2017-10-10 DIAGNOSIS — Z13820 Encounter for screening for osteoporosis: Secondary | ICD-10-CM | POA: Diagnosis not present

## 2017-10-23 ENCOUNTER — Encounter: Payer: Self-pay | Admitting: Family Medicine

## 2017-10-23 ENCOUNTER — Ambulatory Visit: Payer: Federal, State, Local not specified - PPO | Admitting: Family Medicine

## 2017-10-23 VITALS — BP 136/70 | HR 95 | Ht 65.0 in | Wt 214.2 lb

## 2017-10-23 DIAGNOSIS — Z1159 Encounter for screening for other viral diseases: Secondary | ICD-10-CM | POA: Diagnosis not present

## 2017-10-23 DIAGNOSIS — Z789 Other specified health status: Secondary | ICD-10-CM

## 2017-10-23 DIAGNOSIS — M2550 Pain in unspecified joint: Secondary | ICD-10-CM | POA: Diagnosis not present

## 2017-10-23 DIAGNOSIS — Z720 Tobacco use: Secondary | ICD-10-CM

## 2017-10-23 DIAGNOSIS — I10 Essential (primary) hypertension: Secondary | ICD-10-CM | POA: Diagnosis not present

## 2017-10-23 DIAGNOSIS — F901 Attention-deficit hyperactivity disorder, predominantly hyperactive type: Secondary | ICD-10-CM

## 2017-10-23 DIAGNOSIS — E559 Vitamin D deficiency, unspecified: Secondary | ICD-10-CM | POA: Diagnosis not present

## 2017-10-23 DIAGNOSIS — E7800 Pure hypercholesterolemia, unspecified: Secondary | ICD-10-CM | POA: Diagnosis not present

## 2017-10-23 DIAGNOSIS — F339 Major depressive disorder, recurrent, unspecified: Secondary | ICD-10-CM

## 2017-10-23 DIAGNOSIS — Z0000 Encounter for general adult medical examination without abnormal findings: Secondary | ICD-10-CM

## 2017-10-23 DIAGNOSIS — T50905A Adverse effect of unspecified drugs, medicaments and biological substances, initial encounter: Secondary | ICD-10-CM

## 2017-10-23 DIAGNOSIS — E119 Type 2 diabetes mellitus without complications: Secondary | ICD-10-CM | POA: Diagnosis not present

## 2017-10-23 LAB — URINALYSIS, ROUTINE W REFLEX MICROSCOPIC
BILIRUBIN URINE: NEGATIVE
Hgb urine dipstick: NEGATIVE
Ketones, ur: NEGATIVE
LEUKOCYTES UA: NEGATIVE
Nitrite: NEGATIVE
RBC / HPF: NONE SEEN (ref 0–?)
SPECIFIC GRAVITY, URINE: 1.02 (ref 1.000–1.030)
TOTAL PROTEIN, URINE-UPE24: NEGATIVE
UROBILINOGEN UA: 0.2 (ref 0.0–1.0)
Urine Glucose: NEGATIVE
pH: 6.5 (ref 5.0–8.0)

## 2017-10-23 LAB — CBC
HCT: 41.1 % (ref 36.0–46.0)
Hemoglobin: 13.9 g/dL (ref 12.0–15.0)
MCHC: 33.8 g/dL (ref 30.0–36.0)
MCV: 88.6 fl (ref 78.0–100.0)
Platelets: 304 10*3/uL (ref 150.0–400.0)
RBC: 4.64 Mil/uL (ref 3.87–5.11)
RDW: 13.6 % (ref 11.5–15.5)
WBC: 8.1 10*3/uL (ref 4.0–10.5)

## 2017-10-23 LAB — HEMOGLOBIN A1C: Hgb A1c MFr Bld: 6.6 % — ABNORMAL HIGH (ref 4.6–6.5)

## 2017-10-23 LAB — LIPID PANEL
CHOL/HDL RATIO: 3
Cholesterol: 139 mg/dL (ref 0–200)
HDL: 44.7 mg/dL (ref >39.00)
LDL CALC: 66 mg/dL (ref 0–99)
NonHDL: 94.71
TRIGLYCERIDES: 146 mg/dL (ref 0.0–149.0)
VLDL: 29.2 mg/dL (ref 0.0–40.0)

## 2017-10-23 LAB — B12 AND FOLATE PANEL
Folate: >23.6 ng/mL (ref >5.9)
VITAMIN B 12: 593 pg/mL (ref 211–911)

## 2017-10-23 LAB — VITAMIN D 25 HYDROXY (VIT D DEFICIENCY, FRACTURES): VITD: 30.97 ng/mL (ref 30.00–100.00)

## 2017-10-23 LAB — MICROALBUMIN / CREATININE URINE RATIO
Creatinine,U: 66.5 mg/dL
Microalb Creat Ratio: 1.1 mg/g (ref 0.0–30.0)

## 2017-10-23 LAB — TSH: TSH: 1.93 u[IU]/mL (ref 0.35–4.50)

## 2017-10-23 MED ORDER — AMPHETAMINE-DEXTROAMPHET ER 20 MG PO CP24: ORAL_CAPSULE | ORAL | 0 refills | Status: DC

## 2017-10-23 MED ORDER — DULOXETINE HCL 60 MG PO CPEP: 120.0000 mg | ORAL_CAPSULE | Freq: Every day | ORAL | 1 refills | Status: AC

## 2017-10-23 MED ORDER — CANDESARTAN CILEXETIL 32 MG PO TABS: 32.0000 mg | ORAL_TABLET | Freq: Every day | ORAL | 1 refills | Status: DC

## 2017-10-23 MED ORDER — HYDROCHLOROTHIAZIDE 25 MG PO TABS: 25.0000 mg | ORAL_TABLET | Freq: Every day | ORAL | 1 refills | Status: DC

## 2017-10-23 MED ORDER — GLUCOSE BLOOD VI STRP: ORAL_STRIP | 5 refills | Status: DC

## 2017-10-23 MED ORDER — ASPIRIN EC 81 MG PO TBEC: 81.0000 mg | DELAYED_RELEASE_TABLET | Freq: Every day | ORAL | 0 refills | Status: DC

## 2017-10-23 MED ORDER — CHOLECALCIFEROL 25 MCG (1000 UT) PO TABS: 1000.0000 [IU] | ORAL_TABLET | Freq: Every day | ORAL | 1 refills | Status: DC

## 2017-10-23 MED ORDER — ATORVASTATIN CALCIUM 40 MG PO TABS: 40.0000 mg | ORAL_TABLET | Freq: Every day | ORAL | 1 refills | Status: DC

## 2017-10-23 MED ORDER — GLUCOSE BLOOD VI STRP: 1.0000 | ORAL_STRIP | 5 refills | Status: DC | PRN

## 2017-10-23 MED ORDER — METFORMIN HCL 500 MG PO TABS: 500.0000 mg | ORAL_TABLET | Freq: Two times a day (BID) | ORAL | 1 refills | Status: DC

## 2017-10-23 NOTE — Patient Instructions (Addendum)
DASH Eating Plan DASH stands for "Dietary Approaches to Stop Hypertension." The DASH eating plan is a healthy eating plan that has been shown to reduce high blood pressure (hypertension). It may also reduce your risk for type 2 diabetes, heart disease, and stroke. The DASH eating plan may also help with weight loss. What are tips for following this plan? General guidelines  Avoid eating more than 2,300 mg (milligrams) of salt (sodium) a day. If you have hypertension, you may need to reduce your sodium intake to 1,500 mg a day.  Limit alcohol intake to no more than 1 drink a day for nonpregnant women and 2 drinks a day for men. One drink equals 12 oz of beer, 5 oz of wine, or 1 oz of hard liquor.  Work with your health care provider to maintain a healthy body weight or to lose weight. Ask what an ideal weight is for you.  Get at least 30 minutes of exercise that causes your heart to beat faster (aerobic exercise) most days of the week. Activities may include walking, swimming, or biking.  Work with your health care provider or diet and nutrition specialist (dietitian) to adjust your eating plan to your individual calorie needs. Reading food labels  Check food labels for the amount of sodium per serving. Choose foods with less than 5 percent of the Daily Value of sodium. Generally, foods with less than 300 mg of sodium per serving fit into this eating plan.  To find whole grains, look for the word "whole" as the first word in the ingredient list. Shopping  Buy products labeled as "low-sodium" or "no salt added."  Buy fresh foods. Avoid canned foods and premade or frozen meals. Cooking  Avoid adding salt when cooking. Use salt-free seasonings or herbs instead of table salt or sea salt. Check with your health care provider or pharmacist before using salt substitutes.  Do not fry foods. Cook foods using healthy methods such as baking, boiling, grilling, and broiling instead.  Cook with  heart-healthy oils, such as olive, canola, soybean, or sunflower oil. Meal planning   Eat a balanced diet that includes: ? 5 or more servings of fruits and vegetables each day. At each meal, try to fill half of your plate with fruits and vegetables. ? Up to 6-8 servings of whole grains each day. ? Less than 6 oz of lean meat, poultry, or fish each day. A 3-oz serving of meat is about the same size as a deck of cards. One egg equals 1 oz. ? 2 servings of low-fat dairy each day. ? A serving of nuts, seeds, or beans 5 times each week. ? Heart-healthy fats. Healthy fats called Omega-3 fatty acids are found in foods such as flaxseeds and coldwater fish, like sardines, salmon, and mackerel.  Limit how much you eat of the following: ? Canned or prepackaged foods. ? Food that is high in trans fat, such as fried foods. ? Food that is high in saturated fat, such as fatty meat. ? Sweets, desserts, sugary drinks, and other foods with added sugar. ? Full-fat dairy products.  Do not salt foods before eating.  Try to eat at least 2 vegetarian meals each week.  Eat more home-cooked food and less restaurant, buffet, and fast food.  When eating at a restaurant, ask that your food be prepared with less salt or no salt, if possible. What foods are recommended? The items listed may not be a complete list. Talk with your dietitian about what   dietary choices are best for you. Grains Whole-grain or whole-wheat bread. Whole-grain or whole-wheat pasta. Brown rice. [place]. Bulgur. Whole-grain and low-sodium cereals. Pita bread. Low-fat, low-sodium crackers. Whole-wheat flour tortillas. Vegetables Fresh or frozen vegetables (raw, steamed, roasted, or grilled). Low-sodium or reduced-sodium tomato and vegetable juice. Low-sodium or reduced-sodium tomato sauce and tomato paste. Low-sodium or reduced-sodium canned vegetables. Fruits All fresh, dried, or frozen fruit. Canned fruit in natural juice (without  added sugar). Meat and other protein foods Skinless chicken or [place]. Ground chicken or [place]. Pork with fat trimmed off. Fish and seafood. Egg whites. Dried beans, peas, or lentils. Unsalted nuts, nut butters, and seeds. Unsalted canned beans. Lean cuts of beef with fat trimmed off. Low-sodium, lean deli meat. Dairy Low-fat (1%) or fat-free (skim) milk. Fat-free, low-fat, or reduced-fat cheeses. Nonfat, low-sodium ricotta or cottage cheese. Low-fat or nonfat yogurt. Low-fat, low-sodium cheese. Fats and oils Soft margarine without trans fats. Vegetable oil. Low-fat, reduced-fat, or light mayonnaise and salad dressings (reduced-sodium). Canola, safflower, olive, soybean, and sunflower oils. Avocado. Seasoning and other foods Herbs. Spices. Seasoning mixes without salt. Unsalted popcorn and pretzels. Fat-free sweets. What foods are not recommended? The items listed may not be a complete list. Talk with your dietitian about what dietary choices are best for you. Grains Baked goods made with fat, such as croissants, muffins, or some breads. Dry pasta or rice meal packs. Vegetables Creamed or fried vegetables. Vegetables in a cheese sauce. Regular canned vegetables (not low-sodium or reduced-sodium). Regular canned tomato sauce and paste (not low-sodium or reduced-sodium). Regular tomato and vegetable juice (not low-sodium or reduced-sodium). Angie Fava. Olives. Fruits Canned fruit in a light or heavy syrup. Fried fruit. Fruit in cream or butter sauce. Meat and other protein foods Fatty cuts of meat. Ribs. Fried meat. Berniece Salines. Sausage. Bologna and other processed lunch meats. Salami. Fatback. Hotdogs. Bratwurst. Salted nuts and seeds. Canned beans with added salt. Canned or smoked fish. Whole eggs or egg yolks. Chicken or [place] with skin. Dairy Whole or 2% milk, cream, and half-and-half. Whole or full-fat cream cheese. Whole-fat or sweetened yogurt. Full-fat cheese. Nondairy creamers. Whipped toppings.  Processed cheese and cheese spreads. Fats and oils Butter. Stick margarine. Lard. Shortening. Ghee. Bacon fat. Tropical oils, such as coconut, palm kernel, or palm oil. Seasoning and other foods Salted popcorn and pretzels. Onion salt, garlic salt, seasoned salt, table salt, and sea salt. Worcestershire sauce. Tartar sauce. Barbecue sauce. Teriyaki sauce. Soy sauce, including reduced-sodium. Steak sauce. Canned and packaged gravies. Fish sauce. Oyster sauce. Cocktail sauce. Horseradish that you find on the shelf. Ketchup. Mustard. Meat flavorings and tenderizers. Bouillon cubes. Hot sauce and Tabasco sauce. Premade or packaged marinades. Premade or packaged taco seasonings. Relishes. Regular salad dressings. Where to find more information:  National Heart, Lung, and [place]: https://wilson-eaton.com/  American Heart Association: www.heart.org Summary  The DASH eating plan is a healthy eating plan that has been shown to reduce high blood pressure (hypertension). It may also reduce your risk for type 2 diabetes, heart disease, and stroke.  With the DASH eating plan, you should limit salt (sodium) intake to 2,300 mg a day. If you have hypertension, you may need to reduce your sodium intake to 1,500 mg a day.  When on the DASH eating plan, aim to eat more fresh fruits and vegetables, whole grains, lean proteins, low-fat dairy, and heart-healthy fats.  Work with your health care provider or diet and nutrition specialist (dietitian) to adjust your eating plan to your individual  calorie needs. This information is not intended to replace advice given to you by your health care provider. Make sure you discuss any questions you have with your health care provider. Document Released: 07/26/2011 Document Revised: 07/30/2016 Document Reviewed: 07/30/2016 Elsevier Interactive Patient Education  2018 [place].  Heart Disease Prevention Heart disease is a leading cause of death. There are many things  you can do to help prevent heart disease. Be physically active Physical activity is good for your heart. It helps control your blood pressure, cholesterol levels, and weight. Try to be physically active every day. Ask your health care provider what activities are best for you. Be a healthy weight Extra weight can strain your heart and affect your blood pressure and cholesterol levels. Lose weight with diet and exercise if recommended by your health care provider. Eat heart-healthy foods Follow a healthy eating plan as recommended by your health care provider or dietitian. Heart-healthy foods include:  High-fiber foods. These include oat bran, oatmeal, and whole-grain breads and cereals.  Fruits and vegetables.  Avoid:  Alcohol.  Fried foods.  Foods high in saturated fat. These include meats, butter, whole dairy products, shortening, and coconut or palm oil.  Salty foods. These include canned food, luncheon meat, salty snacks, and fast food.  Keep your cholesterol levels under control Cholesterol is a substance that is used for many important functions. When your cholesterol levels are high, cholesterol can stick to the insides of your blood vessels, making them narrow or clog. This can lead to chest pain (angina) and a heart attack. Keep your cholesterol levels under control as recommended by your health care provider. Have your cholesterol checked at least once a year. Target cholesterol levels (in mg/dL) for most people are:  Total cholesterol below 200.  LDL cholesterol below 100.  HDL cholesterol above 40 in men and above 50 in women.  Triglycerides below 150.  Keep your blood pressure under control Having high blood pressure (hypertension) puts you at risk for stroke and other forms of heart disease. Keep your blood pressure under control as recommended by your health care provider. Ask your health care provider if you need treatment to lower your blood pressure. If you are  22-41 years of age, have your blood pressure checked every 3-5 years. If you are 45 years of age or older, have your blood pressure checked every year. Do not use tobacco products Tobacco smoke can damage your heart and blood vessels. Do not use any tobacco products including cigarettes, chewing tobacco, or electronic cigarettes. If you need help quitting, ask your health care provider. Take medicines as directed Take medicines only as directed by your health care provider. Ask your health care provider whether you should take an aspirin every day. Taking aspirin can help reduce your risk of heart disease and stroke. Where to find more information: To find out more about heart disease, visit the American Heart Association's website at www.americanheart.org This information is not intended to replace advice given to you by your health care provider. Make sure you discuss any questions you have with your health care provider. Document Released: 03/20/2004 Document Revised: 01/04/2016 Document Reviewed: 09/30/2013 Elsevier Interactive Patient Education  2018 [place] Lung cancer is one of the most common cancers among both men and women. It occurs when abnormal cells in the lung grow out of control and form a mass (tumor). Although treatments are available, lung cancer is a dangerous form of cancer, especially if  it is not caught early. Lung cancer is the leading cause of cancer death. Smoking tobacco or nicotine products is the cause of most cases of lung cancer. This type of cancer can also result from exposure to certain substances, such as radon or asbestos. By making some lifestyle changes and taking some precautions, you can reduce your risk of getting lung cancer. What can I do to lower my risk?  Do not smoke any tobacco or nicotine products, such as cigarettes, cigars, pipes, and e-cigarettes. If you need help quitting, ask your health care provider.  Avoid  secondhand smoke in the home, car, and workplace.  Get your home tested for radon. Radon is an odorless gas that can seep into your home through cracks in the foundation, walls, or floor. If you have an unsafe level of radon in your home, hire a professional to lower the radon level.  Have your home's drinking water tested for arsenic.  If you must be in workplaces where you might be near harmful substances, such as asbestos, use proper safety precautions to protect your lungs.  If you smoke, do not take beta-carotene supplements. Your risk of lung cancer may increase further if you take these supplements in combination with smoking.  Eat a healthy diet that includes at least 5 servings of fruits and vegetables every day. Why are these changes important?  These changes and precautions address the factors that are known to increase the risk of lung cancer. Taking these steps is the best way to reduce your risk.  In addition to making you less likely to get lung cancer, these changes will also provide other health benefits, as described here: ? Avoiding tobacco and nicotine can reduce your risk for other types of cancer and other health problems. ? Avoiding exposure to poisonous (toxic) chemicals-such as radon, arsenic, and asbestos-can help limit damage to tissues of the lungs and other parts of the body. ? Eating a healthy diet is good for your overall health. What can happen if changes are not made? If you do not take steps to reduce your risk, you may be more likely to develop lung cancer. Tobacco smoke and other harmful substances can take years to damage the cells in your lungs and cause cancer. You may be getting sick without knowing it. Where to find support:  If you need more help to quit smoking or using tobacco, visit these websites: ? VirusCrisis.dk: https://thomas-smith.info/ ? U.S. Department of Health and Human Services: https://hall.info/ ? Centers for Disease  Control and Prevention: ToePaint.co.nz  To get the level of radon in your home tested, you may buy an at-home test kit at a hardware store or home improvement store, or you can hire a professional service. If you have an unsafe level of radon in your home, hire a professional to lower the radon level.  Some risk factors for lung cancer are passed down through families. Talk with your health care provider or genetic counselor to learn more about genetic testing for cancer. Where to find more information: Learn more about lung cancer from:  [place]: CompanyTitles.tn  The Centers for Disease Control and Prevention: PaidProducts.be  NIH Senior Health: NameHandles.gl  The American Lung Association: www.cancer.org/cancer/lungcancer/  Summary  Lung cancer is one of the most common and dangerous forms of cancer.  Smoking tobacco products is the cause of most cases of lung cancer.  Lifestyle changes can be made to reduce the risk of getting lung cancer. These include quitting  smoking, testing for and removing radon in the home, and protecting the lungs at work. This information is not intended to replace advice given to you by your health care provider. Make sure you discuss any questions you have with your health care provider. Document Released: 08/21/2015 Document Revised: 04/14/2016 Document Reviewed: 04/14/2016 Elsevier Interactive Patient Education  2018 [place] Years, Female Preventive care refers to lifestyle choices and visits with your health care provider that can promote health and wellness. What does preventive care include?  A yearly physical exam. This is also called an annual well check.  Dental exams once or twice a year.  Routine eye exams. Ask your health care provider how often you should have your eyes checked.  Personal lifestyle choices,  including: ? Daily care of your teeth and gums. ? Regular physical activity. ? Eating a healthy diet. ? Avoiding tobacco and drug use. ? Limiting alcohol use. ? Practicing safe sex. ? Taking low-dose aspirin daily starting at age 22. ? Taking vitamin and mineral supplements as recommended by your health care provider. What happens during an annual well check? The services and screenings done by your health care provider during your annual well check will depend on your age, overall health, lifestyle risk factors, and family history of disease. Counseling Your health care provider may ask you questions about your:  Alcohol use.  Tobacco use.  Drug use.  Emotional well-being.  Home and relationship well-being.  Sexual activity.  Eating habits.  Work and work Statistician.  Method of birth control.  Menstrual cycle.  Pregnancy history.  Screening You may have the following tests or measurements:  Height, weight, and BMI.  Blood pressure.  Lipid and cholesterol levels. These may be checked every 5 years, or more frequently if you are over 31 years old.  Skin check.  Lung cancer screening. You may have this screening every year starting at age 77 if you have a 30-pack-year history of smoking and currently smoke or have quit within the past 15 years.  Fecal occult blood test (FOBT) of the stool. You may have this test every year starting at age 21.  Flexible sigmoidoscopy or colonoscopy. You may have a sigmoidoscopy every 5 years or a colonoscopy every 10 years starting at age 48.  Hepatitis C blood test.  Hepatitis B blood test.  Sexually transmitted disease (STD) testing.  Diabetes screening. This is done by checking your blood sugar (glucose) after you have not eaten for a while (fasting). You may have this done every 1-3 years.  Mammogram. This may be done every 1-2 years. Talk to your health care provider about when you should start having regular  mammograms. This may depend on whether you have a family history of breast cancer.  BRCA-related cancer screening. This may be done if you have a family history of breast, ovarian, tubal, or peritoneal cancers.  Pelvic exam and Pap test. This may be done every 3 years starting at age 45. Starting at age 93, this may be done every 5 years if you have a Pap test in combination with an HPV test.  Bone density scan. This is done to screen for osteoporosis. You may have this scan if you are at high risk for osteoporosis.  Discuss your test results, treatment options, and if necessary, the need for more tests with your health care provider. Vaccines Your health care provider may recommend certain vaccines, such as:  Influenza vaccine. This is recommended every  year.  Tetanus, diphtheria, and acellular pertussis (Tdap, Td) vaccine. You may need a Td booster every 10 years.  Varicella vaccine. You may need this if you have not been vaccinated.  Zoster vaccine. You may need this after age 64.  Measles, mumps, and rubella (MMR) vaccine. You may need at least one dose of MMR if you were born in 1957 or later. You may also need a second dose.  Pneumococcal 13-valent conjugate (PCV13) vaccine. You may need this if you have certain conditions and were not previously vaccinated.  Pneumococcal polysaccharide (PPSV23) vaccine. You may need one or two doses if you smoke cigarettes or if you have certain conditions.  Meningococcal vaccine. You may need this if you have certain conditions.  Hepatitis A vaccine. You may need this if you have certain conditions or if you travel or work in places where you may be exposed to hepatitis A.  Hepatitis B vaccine. You may need this if you have certain conditions or if you travel or work in places where you may be exposed to hepatitis B.  Haemophilus influenzae type b (Hib) vaccine. You may need this if you have certain conditions.  Talk to your health care  provider about which screenings and vaccines you need and how often you need them. This information is not intended to replace advice given to you by your health care provider. Make sure you discuss any questions you have with your health care provider. Document Released: 09/02/2015 Document Revised: 04/25/2016 Document Reviewed: 06/07/2015 Elsevier Interactive Patient Education  2018 [employer].  Steps to Quit Smoking Smoking tobacco can be harmful to your health and can affect almost every organ in your body. Smoking puts you, and those around you, at risk for developing many serious chronic diseases. Quitting smoking is difficult, but it is one of the best things that you can do for your health. It is never too late to quit. What are the benefits of quitting smoking? When you quit smoking, you lower your risk of developing serious diseases and conditions, such as:  Lung cancer or lung disease, such as COPD.  Heart disease.  Stroke.  Heart attack.  Infertility.  Osteoporosis and bone fractures.  Additionally, symptoms such as coughing, wheezing, and shortness of breath may get better when you quit. You may also find that you get sick less often because your body is stronger at fighting off colds and infections. If you are pregnant, quitting smoking can help to reduce your chances of having a baby of low birth weight. How do I get ready to quit? When you decide to quit smoking, create a plan to make sure that you are successful. Before you quit:  Pick a date to quit. Set a date within the next two weeks to give you time to prepare.  Write down the reasons why you are quitting. Keep this list in places where you will see it often, such as on your bathroom mirror or in your car or wallet.  Identify the people, places, things, and activities that make you want to smoke (triggers) and avoid them. Make sure to take these actions: ? Throw away all cigarettes at home, at work, and in your  car. ? Throw away smoking accessories, such as Scientist, research (medical). ? Clean your car and make sure to empty the ashtray. ? Clean your home, including curtains and carpets.  Tell your family, friends, and coworkers that you are quitting. Support from your loved ones can make quitting  easier.  Talk with your health care provider about your options for quitting smoking.  Find out what treatment options are covered by your health insurance.  What strategies can I use to quit smoking? Talk with your healthcare provider about different strategies to quit smoking. Some strategies include:  Quitting smoking altogether instead of gradually lessening how much you smoke over a period of time. Research shows that quitting "cold [place]" is more successful than gradually quitting.  Attending in-person counseling to help you build problem-solving skills. You are more likely to have success in quitting if you attend several counseling sessions. Even short sessions of 10 minutes can be effective.  Finding resources and support systems that can help you to quit smoking and remain smoke-free after you quit. These resources are most helpful when you use them often. They can include: ? Online chats with a Social worker. ? Telephone quitlines. ? Careers information officer. ? Support groups or group counseling. ? Text messaging programs. ? Mobile phone applications.  Taking medicines to help you quit smoking. (If you are pregnant or breastfeeding, talk with your health care provider first.) Some medicines contain nicotine and some do not. Both types of medicines help with cravings, but the medicines that include nicotine help to relieve withdrawal symptoms. Your health care provider may recommend: ? Nicotine patches, gum, or lozenges. ? Nicotine inhalers or sprays. ? Non-nicotine medicine that is taken by mouth.  Talk with your health care provider about combining strategies, such as taking medicines while  you are also receiving in-person counseling. Using these two strategies together makes you more likely to succeed in quitting than if you used either strategy on its own. If you are pregnant or breastfeeding, talk with your health care provider about finding counseling or other support strategies to quit smoking. Do not take medicine to help you quit smoking unless told to do so by your health care provider. What things can I do to make it easier to quit? Quitting smoking might feel overwhelming at first, but there is a lot that you can do to make it easier. Take these important actions:  Reach out to your family and friends and ask that they support and encourage you during this time. Call telephone quitlines, reach out to support groups, or work with a counselor for support.  Ask people who smoke to avoid smoking around you.  Avoid places that trigger you to smoke, such as bars, parties, or smoke-break areas at work.  Spend time around people who do not smoke.  Lessen stress in your life, because stress can be a smoking trigger for some people. To lessen stress, try: ? Exercising regularly. ? Deep-breathing exercises. ? Yoga. ? Meditating. ? Performing a body scan. This involves closing your eyes, scanning your body from head to toe, and noticing which parts of your body are particularly tense. Purposefully relax the muscles in those areas.  Download or purchase mobile phone or tablet apps (applications) that can help you stick to your quit plan by providing reminders, tips, and encouragement. There are many free apps, such as QuitGuide from the [employer] manager for Disease Control and Prevention). You can find other support for quitting smoking (smoking cessation) through smokefree.gov and other websites.  How will I feel when I quit smoking? Within the first 24 hours of quitting smoking, you may start to feel some withdrawal symptoms. These symptoms are usually most noticeable 2-3 days after  quitting, but they usually do not last beyond 2-3 weeks. Changes  or symptoms that you might experience include:  Mood swings.  Restlessness, anxiety, or irritation.  Difficulty concentrating.  Dizziness.  Strong cravings for sugary foods in addition to nicotine.  Mild weight gain.  Constipation.  Nausea.  Coughing or a sore throat.  Changes in how your medicines work in your body.  A depressed mood.  Difficulty sleeping (insomnia).  After the first 2-3 weeks of quitting, you may start to notice more positive results, such as:  Improved sense of smell and taste.  Decreased coughing and sore throat.  Slower heart rate.  Lower blood pressure.  Clearer skin.  The ability to breathe more easily.  Fewer sick days.  Quitting smoking is very challenging for most people. Do not get discouraged if you are not successful the first time. Some people need to make many attempts to quit before they achieve long-term success. Do your best to stick to your quit plan, and talk with your health care provider if you have any questions or concerns. This information is not intended to replace advice given to you by your health care provider. Make sure you discuss any questions you have with your health care provider. Document Released: 07/31/2001 Document Revised: 04/03/2016 Document Reviewed: 12/21/2014 Elsevier Interactive Patient Education  [employer].

## 2017-10-23 NOTE — Addendum Note (Signed)
Addended by: Kateri Mc E on: 10/23/2017 10:35 AM   Modules accepted: Orders

## 2017-10-23 NOTE — Progress Notes (Signed)
Subjective:  Patient ID: Paula Kelly, female    DOB: 1956-07-04  Age: 62 y.o. MRN: [id number]  CC: Follow-up   HPI Paula Kelly presents for follow-up of her hypertension, diabetes, elevated cholesterol, ADHD, chronic pain and depression.  Blood pressure is been well controlled.  She had a colonoscopy and advised to return in years.  A polyp was reviewed.  Pap smear and pelvic performed last month.  She had a mammogram as well.  She is fasting today for blood work.  She is exercising and stretches.  She is planning on following up with a psychiatrist in July for her depression and ADHD.  She has a 30-pack-year history of smoking and will qualify for CT scan.  She continues to be challenged by her 59 year old daughter who has bipolar disease and is also working as a Marine scientist in the wound care center.  She her left second toe has been to take a look.  Outpatient Medications Prior to Visit  Medication Sig Dispense Refill  . Coenzyme Q-10 200 MG CAPS 1-2 capsules daily for cardiovascular health    . DOCOSAHEXAENOIC ACID PO Take 1 g by mouth daily.    . Multiple Vitamin (MULTI-VITAMINS) TABS Take 1 tablet by mouth daily.    . vitamin E 400 UNIT capsule Take 1 capsule by mouth daily.    Marland Kitchen amphetamine-dextroamphetamine (ADDERALL XR) 20 MG 24 hr capsule Take 2 capsules (40 mg total) by mouth every morning. 60 capsule 0  . aspirin EC 81 MG tablet Take 1 tablet by mouth daily.    Marland Kitchen atorvastatin (LIPITOR) 40 MG tablet Take 1 tablet by mouth daily.    . candesartan (ATACAND) 32 MG tablet Take 1 tablet by mouth daily.    . Cholecalciferol (VITAMIN D-1000 MAX ST) 1000 units tablet Take 1 tablet by mouth daily.    . DULoxetine (CYMBALTA) 60 MG capsule Take 2 capsules by mouth daily.    Marland Kitchen glucose blood (FREESTYLE LITE) test strip Test daily and prn symptoms (Type 2 diabetes)    . hydrochlorothiazide (HYDRODIURIL) 25 MG tablet Take 1 tablet by mouth daily.    . metFORMIN (GLUCOPHAGE) 500 MG tablet  Take 1 tablet by mouth 2 (two) times daily.    . celecoxib (CELEBREX) 200 MG capsule Take 1 capsule (200 mg total) by mouth daily. As needed with food. (Patient not taking: Reported on 07/31/2017) 90 capsule 1   Facility-Administered Medications Prior to Visit  Medication Dose Route Frequency Provider Last Rate Last Dose  . 0.9 %  sodium chloride infusion  500 mL Intravenous Continuous Nandigam, Venia Minks, MD        ROS Review of Systems  Constitutional: Negative.   HENT: Negative.   Eyes: Negative.   Respiratory: Negative.   Cardiovascular: Negative.   Gastrointestinal: Negative.   Endocrine: Negative for polyphagia and polyuria.  Genitourinary: Negative for difficulty urinating and urgency.  Musculoskeletal: Positive for arthralgias.  Skin: Negative.   Allergic/Immunologic: Negative for immunocompromised state.  Neurological: Negative for weakness and headaches.  Hematological: Does not bruise/bleed easily.  Psychiatric/Behavioral: Negative.     Objective:  BP 136/70 (BP Location: Left Arm, Patient Position: Sitting, Cuff Size: Normal)   Pulse 95   Ht 5\' 5"  (1.651 m)   Wt 214 lb 4 oz (97.2 kg)   SpO2 95%   BMI 35.65 kg/m   BP Readings from Last 3 Encounters:  10/23/17 136/70  08/12/17 (!) 133/57  07/25/17 136/70    Wt Readings from  Last 3 Encounters:  10/23/17 214 lb 4 oz (97.2 kg)  08/12/17 218 lb (98.9 kg)  07/31/17 218 lb 6.4 oz (99.1 kg)    Physical Exam  Constitutional: She is oriented to person, place, and time. She appears well-developed and well-nourished. No distress.  HENT:  Head: Normocephalic and atraumatic.  Right Ear: External ear normal.  Left Ear: External ear normal.  Mouth/Throat: Oropharynx is clear and moist. No oropharyngeal exudate.  Eyes: Pupils are equal, round, and reactive to light. Right eye exhibits no discharge. Left eye exhibits no discharge. No scleral icterus.  Neck: Neck supple. No JVD present. No tracheal deviation present. No  thyromegaly present.  Cardiovascular: Normal rate, regular rhythm and normal heart sounds.  Pulmonary/Chest: Effort normal and breath sounds normal. No stridor.  Abdominal: Bowel sounds are normal.  Musculoskeletal: She exhibits no edema.       Feet:  Lymphadenopathy:    She has no cervical adenopathy.  Neurological: She is alert and oriented to person, place, and time.  Skin: Skin is warm and dry. She is not diaphoretic.  Psychiatric: She has a normal mood and affect. Her behavior is normal.    No results found for: WBC, HGB, HCT, PLT, GLUCOSE, CHOL, TRIG, HDL, LDLDIRECT, LDLCALC, ALT, AST, NA, K, CL, CREATININE, BUN, CO2, TSH, PSA, INR, GLUF, HGBA1C, MICROALBUR  Dg Bone Density (dxa)  Result Date: 10/10/2017 EXAM: DUAL X-RAY ABSORPTIOMETRY (DXA) FOR BONE MINERAL DENSITY IMPRESSION: Referring Physician:  Yevonne Kelly PATIENT: Name: Paula, Kelly Patient ID: [id number] Birth Date: 08-24-1955 Height: 64.5 in. Sex: Female Measured: 10/10/2017 Weight: 216.3 lbs. Indications: Caucasian, Diabetic, Estrogen Deficiency, Post Menopausal, Tobacco User(Current) Fractures: Treatments: Calcium, Metformin, Multivitamin, Vitamin D ASSESSMENT: The BMD measured at Femur Total Left is 0.891 g/cm2 with a T-score of -0.9. This patient is considered normal according to [facility]) criteria. Site Region Measured Date Measured Age WHO YA BMD Classification T-score AP Spine L1-L4 10/10/2017 60.9 Normal 1.0 1.301 g/cm2 DualFemur Total Left 10/10/2017 60.9 years Normal -0.9 0.891 g/cm2 World Health Organization [facility]) criteria for post-menopausal, Caucasian Women: Normal       T-score at or above -1 SD Osteopenia   T-score between -1 and -2.5 SD Osteoporosis T-score at or below -2.5 SD RECOMMENDATION: [place] recommends that FDA-approved medical therapies be considered in postmenopausal women and men age 29 or older with a: 1. Hip or vertebral (clinical or morphometric) fracture.  2. T-score of < -2.5 at the spine or hip. 3. Ten-year fracture probability by FRAX of 3% or greater for hip fracture or 20% or greater for major osteoporotic fracture. All treatment decisions require clinical judgment and consideration of individual patient factors, including patient preferences, co-morbidities, previous drug use, risk factors not captured in the FRAX model (e.g. falls, vitamin D deficiency, increased bone turnover, interval significant decline in bone density) and possible under - or over-estimation of fracture risk by FRAX. All patients should ensure an adequate intake of dietary calcium (1200 mg/d) and vitamin D (800 IU daily) unless contraindicated. FOLLOW-UP: People with diagnosed cases of osteoporosis or at high risk for fracture should have regular bone mineral density tests. For patients eligible for Medicare, routine testing is allowed once every 2 years. The testing frequency can be increased to one year for patients who have rapidly progressing disease, those who are receiving or discontinuing medical therapy to restore bone mass, or have additional risk factors. I have reviewed this report and agree with the above findings. [facility] Radiology  Electronically Signed   By: Lowella Grip III M.D.   On: 10/10/2017 11:24    Assessment & Plan:   Paula Kelly was seen today for follow-up.  Diagnoses and all orders for this visit:  Essential hypertension -     Urinalysis, Routine w reflex microscopic -     TSH -     CBC -     aspirin EC 81 MG tablet; Take 1 tablet (81 mg total) by mouth daily. -     candesartan (ATACAND) 32 MG tablet; Take 1 tablet (32 mg total) by mouth daily. -     hydrochlorothiazide (HYDRODIURIL) 25 MG tablet; Take 1 tablet (25 mg total) by mouth daily.  Attention deficit hyperactivity disorder (ADHD), predominantly hyperactive type -     Discontinue: amphetamine-dextroamphetamine (ADDERALL XR) 20 MG 24 hr capsule; Take 2 capsules (40 mg total) by mouth  every morning. -     Discontinue: amphetamine-dextroamphetamine (ADDERALL XR) 20 MG 24 hr capsule; Take 2 capsules (40 mg total) by mouth every morning. -     Discontinue: amphetamine-dextroamphetamine (ADDERALL XR) 20 MG 24 hr capsule; Take 2 capsules (40 mg total) by mouth every morning. -     Discontinue: amphetamine-dextroamphetamine (ADDERALL XR) 20 MG 24 hr capsule; Take 2 capsules (40 mg total) by mouth every morning. -     amphetamine-dextroamphetamine (ADDERALL XR) 20 MG 24 hr capsule; Take 2 capsules (40 mg total) by mouth every morning.  Controlled type 2 diabetes mellitus without complication, without long-term current use of insulin (HCC) -     Microalbumin / creatinine urine ratio -     Urinalysis, Routine w reflex microscopic -     Lipid panel -     Hemoglobin A1c -     aspirin EC 81 MG tablet; Take 1 tablet (81 mg total) by mouth daily. -     candesartan (ATACAND) 32 MG tablet; Take 1 tablet (32 mg total) by mouth daily. -     glucose blood (FREESTYLE LITE) test strip; 1 each by Other route as needed for other. Use as instructed -     metFORMIN (GLUCOPHAGE) 500 MG tablet; Take 1 tablet (500 mg total) by mouth 2 (two) times daily.  Elevated cholesterol -     aspirin EC 81 MG tablet; Take 1 tablet (81 mg total) by mouth daily. -     atorvastatin (LIPITOR) 40 MG tablet; Take 1 tablet (40 mg total) by mouth daily.  Tobacco use -     Ambulatory Referral for Lung Cancer Scre -     aspirin EC 81 MG tablet; Take 1 tablet (81 mg total) by mouth daily.  Multiple joint pain -     DULoxetine (CYMBALTA) 60 MG capsule; Take 2 capsules (120 mg total) by mouth daily.  Health care maintenance  Health maintenance alteration -     VITAMIN D 25 Hydroxy (Vit-D Deficiency, Fractures)  Encounter for hepatitis C screening test for low risk patient -     Hepatitis C antibody  Vitamin D deficiency -     VITAMIN D 25 Hydroxy (Vit-D Deficiency, Fractures) -     Cholecalciferol (VITAMIN  D-1000 MAX ST) 1000 units tablet; Take 1 tablet (1,000 Units total) by mouth daily.  Depression, recurrent ([place]) -     DULoxetine (CYMBALTA) 60 MG capsule; Take 2 capsules (120 mg total) by mouth daily.  Adverse effect of drug, initial encounter -     B12 and Folate Panel   I have discontinued Benjamine Mola  B. Morello's celecoxib. I have also changed her aspirin EC, atorvastatin, candesartan, Cholecalciferol, DULoxetine, glucose blood, hydrochlorothiazide, and metFORMIN. Additionally, I am having her maintain her Coenzyme Q-10, DOCOSAHEXAENOIC ACID PO, MULTI-VITAMINS, vitamin E, and amphetamine-dextroamphetamine. We will continue to administer sodium chloride.  Meds ordered this encounter  Medications  . DISCONTD: amphetamine-dextroamphetamine (ADDERALL XR) 20 MG 24 hr capsule    Sig: Take 2 capsules (40 mg total) by mouth every morning.    Dispense:  60 capsule    Refill:  0    Fill date: 3.6.19  . DISCONTD: amphetamine-dextroamphetamine (ADDERALL XR) 20 MG 24 hr capsule    Sig: Take 2 capsules (40 mg total) by mouth every morning.    Dispense:  60 capsule    Refill:  0    Fill date: 4.6.19  . DISCONTD: amphetamine-dextroamphetamine (ADDERALL XR) 20 MG 24 hr capsule    Sig: Take 2 capsules (40 mg total) by mouth every morning.    Dispense:  60 capsule    Refill:  0    Fill date: 5.6.19  . DISCONTD: amphetamine-dextroamphetamine (ADDERALL XR) 20 MG 24 hr capsule    Sig: Take 2 capsules (40 mg total) by mouth every morning.    Dispense:  60 capsule    Refill:  0    Fill date: 6.6.19  . amphetamine-dextroamphetamine (ADDERALL XR) 20 MG 24 hr capsule    Sig: Take 2 capsules (40 mg total) by mouth every morning.    Dispense:  60 capsule    Refill:  0    Fill date: 7.6.19  . aspirin EC 81 MG tablet    Sig: Take 1 tablet (81 mg total) by mouth daily.    Dispense:  365 tablet    Refill:  0  . atorvastatin (LIPITOR) 40 MG tablet    Sig: Take 1 tablet (40 mg total) by mouth daily.     Dispense:  100 tablet    Refill:  1  . candesartan (ATACAND) 32 MG tablet    Sig: Take 1 tablet (32 mg total) by mouth daily.    Dispense:  100 tablet    Refill:  1  . Cholecalciferol (VITAMIN D-1000 MAX ST) 1000 units tablet    Sig: Take 1 tablet (1,000 Units total) by mouth daily.    Dispense:  100 tablet    Refill:  1  . DULoxetine (CYMBALTA) 60 MG capsule    Sig: Take 2 capsules (120 mg total) by mouth daily.    Dispense:  180 capsule    Refill:  1  . glucose blood (FREESTYLE LITE) test strip    Sig: 1 each by Other route as needed for other. Use as instructed    Dispense:  100 each    Refill:  5  . hydrochlorothiazide (HYDRODIURIL) 25 MG tablet    Sig: Take 1 tablet (25 mg total) by mouth daily.    Dispense:  100 tablet    Refill:  1  . metFORMIN (GLUCOPHAGE) 500 MG tablet    Sig: Take 1 tablet (500 mg total) by mouth 2 (two) times daily.    Dispense:  180 tablet    Refill:  1   Labs were done were drawn today and results are pending.  She will buddy tape her toe to the third toe as needed.  Expected to be sore for 4-6 weeks.  He is not interested in quitting tobacco at this time but I advised her that it is never too late to do  so.  She agrees to go for consultation for a CT scan of her chest.  She will be following up with psychiatry for depression and ADHD in July.  DEXA scan results were reviewed and they were normal.  We will continue to treat her vitamin D deficiency.  She will follow-up in 6 months. Follow-up: Return in about 6 months (around 04/25/2018).  Libby Maw, MD

## 2017-10-24 LAB — HEPATITIS C ANTIBODY
Hepatitis C Ab: NONREACTIVE
SIGNAL TO CUT-OFF: 0.01 (ref <1.00)

## 2017-10-28 DIAGNOSIS — F900 Attention-deficit hyperactivity disorder, predominantly inattentive type: Secondary | ICD-10-CM | POA: Diagnosis not present

## 2017-10-28 DIAGNOSIS — F3340 Major depressive disorder, recurrent, in remission, unspecified: Secondary | ICD-10-CM | POA: Diagnosis not present

## 2017-11-18 ENCOUNTER — Encounter: Payer: Self-pay | Admitting: Family Medicine

## 2017-11-18 ENCOUNTER — Ambulatory Visit: Payer: Federal, State, Local not specified - PPO | Admitting: Family Medicine

## 2017-11-18 ENCOUNTER — Ambulatory Visit (HOSPITAL_BASED_OUTPATIENT_CLINIC_OR_DEPARTMENT_OTHER)
Admission: RE | Admit: 2017-11-18 | Discharge: 2017-11-18 | Disposition: A | Discharge status: Home / Self Care | Payer: Federal, State, Local not specified - PPO | Source: Ambulatory Visit | Attending: Family Medicine | Admitting: Family Medicine

## 2017-11-18 VITALS — BP 124/70 | HR 81 | Ht 65.0 in | Wt 215.1 lb

## 2017-11-18 DIAGNOSIS — M79662 Pain in left lower leg: Secondary | ICD-10-CM

## 2017-11-18 NOTE — Progress Notes (Signed)
Subjective:  Patient ID: Paula Kelly, female    DOB: 1956-07-16  Age: 62 y.o. MRN: [id number]  CC: left calf pain   HPI Paula Kelly presents for evaluation of left calf pain.  There was no injury.  She awoke this morning in her bed with soreness in her left calf.  She has no history of DVT.  She does smoke.  She has a  family history of embolic disease involving the central nervous system.  She did notice increased Pain when she went to walk her dog.  Outpatient Medications Prior to Visit  Medication Sig Dispense Refill  . amphetamine-dextroamphetamine (ADDERALL XR) 20 MG 24 hr capsule Take 2 capsules (40 mg total) by mouth every morning. 60 capsule 0  . aspirin EC 81 MG tablet Take 1 tablet (81 mg total) by mouth daily. 365 tablet 0  . atorvastatin (LIPITOR) 40 MG tablet Take 1 tablet (40 mg total) by mouth daily. 100 tablet 1  . candesartan (ATACAND) 32 MG tablet Take 1 tablet (32 mg total) by mouth daily. 100 tablet 1  . Cholecalciferol (VITAMIN D-1000 MAX ST) 1000 units tablet Take 1 tablet (1,000 Units total) by mouth daily. 100 tablet 1  . Coenzyme Q-10 200 MG CAPS 1-2 capsules daily for cardiovascular health    . DOCOSAHEXAENOIC ACID PO Take 1 g by mouth daily.    . DULoxetine (CYMBALTA) 60 MG capsule Take 2 capsules (120 mg total) by mouth daily. 180 capsule 1  . glucose blood (FREESTYLE LITE) test strip Use to test 1-2 times daily. 100 each 5  . hydrochlorothiazide (HYDRODIURIL) 25 MG tablet Take 1 tablet (25 mg total) by mouth daily. 100 tablet 1  . metFORMIN (GLUCOPHAGE) 500 MG tablet Take 1 tablet (500 mg total) by mouth 2 (two) times daily. 180 tablet 1  . Multiple Vitamin (MULTI-VITAMINS) TABS Take 1 tablet by mouth daily.    . vitamin E 400 UNIT capsule Take 1 capsule by mouth daily.     Facility-Administered Medications Prior to Visit  Medication Dose Route Frequency Provider Last Rate Last Dose  . 0.9 %  sodium chloride infusion  500 mL Intravenous  Continuous Nandigam, Venia Minks, MD        ROS Review of Systems  Constitutional: Negative.   Respiratory: Negative.  Negative for chest tightness and shortness of breath.   Cardiovascular: Negative for chest pain and palpitations.  Gastrointestinal: Negative.  Negative for nausea and vomiting.  Musculoskeletal: Positive for gait problem and myalgias.  Hematological: Does not bruise/bleed easily.  Psychiatric/Behavioral: Negative.     Objective:  BP 124/70 (BP Location: Left Arm, Patient Position: Sitting, Cuff Size: Normal)   Pulse 81   Ht 5\' 5"  (1.651 m)   Wt 215 lb 2 oz (97.6 kg)   SpO2 96%   BMI 35.80 kg/m   BP Readings from Last 3 Encounters:  11/18/17 124/70  10/23/17 136/70  08/12/17 (!) 133/57    Wt Readings from Last 3 Encounters:  11/18/17 215 lb 2 oz (97.6 kg)  10/23/17 214 lb 4 oz (97.2 kg)  08/12/17 218 lb (98.9 kg)    Physical Exam  Constitutional: She is oriented to person, place, and time. She appears well-developed and well-nourished. No distress.  HENT:  Head: Normocephalic and atraumatic.  Right Ear: External ear normal.  Left Ear: External ear normal.  Eyes: Right eye exhibits no discharge. Left eye exhibits no discharge. No scleral icterus.  Neck: No JVD present. No tracheal deviation  present.  Cardiovascular:  Pulses:      Dorsalis pedis pulses are 1+ on the right side, and 1+ on the left side.       Posterior tibial pulses are 1+ on the right side, and 1+ on the left side.  Pulmonary/Chest: Effort normal.  Musculoskeletal:       Legs: Neurological: She is alert and oriented to person, place, and time.  Skin: She is not diaphoretic.    Lab Results  Component Value Date   WBC 8.1 10/23/2017   HGB 13.9 10/23/2017   HCT 41.1 10/23/2017   PLT 304.0 10/23/2017   CHOL 139 10/23/2017   TRIG 146.0 10/23/2017   HDL 44.70 10/23/2017   LDLCALC 66 10/23/2017   TSH 1.93 10/23/2017   HGBA1C 6.6 (H) 10/23/2017   MICROALBUR <0.7 10/23/2017     Dg Bone Density (dxa)  Result Date: 10/10/2017 EXAM: DUAL X-RAY ABSORPTIOMETRY (DXA) FOR BONE MINERAL DENSITY IMPRESSION: Referring Physician:  Yevonne Aline PATIENT: Name: Paula Kelly, Paula Kelly Patient ID: [id number] Birth Date: 12-13-1955 Height: 64.5 in. Sex: Female Measured: 10/10/2017 Weight: 216.3 lbs. Indications: Caucasian, Diabetic, Estrogen Deficiency, Post Menopausal, Tobacco User(Current) Fractures: Treatments: Calcium, Metformin, Multivitamin, Vitamin D ASSESSMENT: The BMD measured at Femur Total Left is 0.891 g/cm2 with a T-score of -0.9. This patient is considered normal according to [facility]) criteria. Site Region Measured Date Measured Age WHO YA BMD Classification T-score AP Spine L1-L4 10/10/2017 60.9 Normal 1.0 1.301 g/cm2 DualFemur Total Left 10/10/2017 60.9 years Normal -0.9 0.891 g/cm2 World Health Organization [facility]) criteria for post-menopausal, Caucasian Women: Normal       T-score at or above -1 SD Osteopenia   T-score between -1 and -2.5 SD Osteoporosis T-score at or below -2.5 SD RECOMMENDATION: [place] recommends that FDA-approved medical therapies be considered in postmenopausal women and men age 30 or older with a: 1. Hip or vertebral (clinical or morphometric) fracture. 2. T-score of < -2.5 at the spine or hip. 3. Ten-year fracture probability by FRAX of 3% or greater for hip fracture or 20% or greater for major osteoporotic fracture. All treatment decisions require clinical judgment and consideration of individual patient factors, including patient preferences, co-morbidities, previous drug use, risk factors not captured in the FRAX model (e.g. falls, vitamin D deficiency, increased bone turnover, interval significant decline in bone density) and possible under - or over-estimation of fracture risk by FRAX. All patients should ensure an adequate intake of dietary calcium (1200 mg/d) and vitamin D (800 IU daily) unless contraindicated.  FOLLOW-UP: People with diagnosed cases of osteoporosis or at high risk for fracture should have regular bone mineral density tests. For patients eligible for Medicare, routine testing is allowed once every 2 years. The testing frequency can be increased to one year for patients who have rapidly progressing disease, those who are receiving or discontinuing medical therapy to restore bone mass, or have additional risk factors. I have reviewed this report and agree with the above findings. [facility] Radiology Electronically Signed   By: Lowella Grip III M.D.   On: 10/10/2017 11:24    Assessment & Plan:   Paula Kelly was seen today for left calf pain.  Diagnoses and all orders for this visit:  Pain of left calf -     US Venous Img Lower Unilateral Left; Future   I am having Andres B. Keepers maintain her Coenzyme Q-10, DOCOSAHEXAENOIC ACID PO, MULTI-VITAMINS, vitamin E, amphetamine-dextroamphetamine, aspirin EC, atorvastatin, candesartan, Cholecalciferol, DULoxetine, hydrochlorothiazide, metFORMIN, and glucose blood.  We will continue to administer sodium chloride.  No orders of the defined types were placed in this encounter.  Suspect strain.  Hopefully venous Dopplers will be negative.  She will treat the tender area with cold compresses and Tylenol.  Follow-up: Return follow up pends results of venous dopplers.Libby Maw, MD

## 2017-12-10 DIAGNOSIS — H2513 Age-related nuclear cataract, bilateral: Secondary | ICD-10-CM | POA: Diagnosis not present

## 2017-12-10 DIAGNOSIS — H04123 Dry eye syndrome of bilateral lacrimal glands: Secondary | ICD-10-CM | POA: Diagnosis not present

## 2017-12-10 DIAGNOSIS — H40033 Anatomical narrow angle, bilateral: Secondary | ICD-10-CM | POA: Diagnosis not present

## 2017-12-10 DIAGNOSIS — E119 Type 2 diabetes mellitus without complications: Secondary | ICD-10-CM | POA: Diagnosis not present

## 2017-12-10 DIAGNOSIS — H43813 Vitreous degeneration, bilateral: Secondary | ICD-10-CM | POA: Diagnosis not present

## 2017-12-25 DIAGNOSIS — H40033 Anatomical narrow angle, bilateral: Secondary | ICD-10-CM | POA: Diagnosis not present

## 2017-12-25 DIAGNOSIS — H16223 Keratoconjunctivitis sicca, not specified as Sjogren's, bilateral: Secondary | ICD-10-CM | POA: Diagnosis not present

## 2017-12-25 DIAGNOSIS — H2513 Age-related nuclear cataract, bilateral: Secondary | ICD-10-CM | POA: Diagnosis not present

## 2017-12-25 DIAGNOSIS — H04123 Dry eye syndrome of bilateral lacrimal glands: Secondary | ICD-10-CM | POA: Diagnosis not present

## 2018-01-08 DIAGNOSIS — H40033 Anatomical narrow angle, bilateral: Secondary | ICD-10-CM | POA: Diagnosis not present

## 2018-01-20 DIAGNOSIS — H40033 Anatomical narrow angle, bilateral: Secondary | ICD-10-CM | POA: Diagnosis not present

## 2018-02-24 DIAGNOSIS — F3340 Major depressive disorder, recurrent, in remission, unspecified: Secondary | ICD-10-CM | POA: Diagnosis not present

## 2018-02-24 DIAGNOSIS — F901 Attention-deficit hyperactivity disorder, predominantly hyperactive type: Secondary | ICD-10-CM | POA: Diagnosis not present

## 2018-02-26 DIAGNOSIS — H2513 Age-related nuclear cataract, bilateral: Secondary | ICD-10-CM | POA: Diagnosis not present

## 2018-02-26 DIAGNOSIS — H40033 Anatomical narrow angle, bilateral: Secondary | ICD-10-CM | POA: Diagnosis not present

## 2018-02-26 DIAGNOSIS — H04123 Dry eye syndrome of bilateral lacrimal glands: Secondary | ICD-10-CM | POA: Diagnosis not present

## 2018-02-26 DIAGNOSIS — H16223 Keratoconjunctivitis sicca, not specified as Sjogren's, bilateral: Secondary | ICD-10-CM | POA: Diagnosis not present

## 2018-04-25 ENCOUNTER — Other Ambulatory Visit (INDEPENDENT_AMBULATORY_CARE_PROVIDER_SITE_OTHER): Payer: Federal, State, Local not specified - PPO

## 2018-04-25 ENCOUNTER — Ambulatory Visit: Payer: Federal, State, Local not specified - PPO | Admitting: Family Medicine

## 2018-04-25 ENCOUNTER — Encounter: Payer: Self-pay | Admitting: Family Medicine

## 2018-04-25 VITALS — BP 110/72 | HR 89 | Ht 65.0 in | Wt 210.4 lb

## 2018-04-25 DIAGNOSIS — E559 Vitamin D deficiency, unspecified: Secondary | ICD-10-CM | POA: Diagnosis not present

## 2018-04-25 DIAGNOSIS — E7800 Pure hypercholesterolemia, unspecified: Secondary | ICD-10-CM

## 2018-04-25 DIAGNOSIS — Z23 Encounter for immunization: Secondary | ICD-10-CM

## 2018-04-25 DIAGNOSIS — I10 Essential (primary) hypertension: Secondary | ICD-10-CM | POA: Diagnosis not present

## 2018-04-25 DIAGNOSIS — E119 Type 2 diabetes mellitus without complications: Secondary | ICD-10-CM

## 2018-04-25 DIAGNOSIS — Z720 Tobacco use: Secondary | ICD-10-CM | POA: Diagnosis not present

## 2018-04-25 LAB — MICROALBUMIN / CREATININE URINE RATIO
CREATININE, U: 131.8 mg/dL
MICROALB UR: 1.7 mg/dL (ref 0.0–1.9)
Microalb Creat Ratio: 1.3 mg/g (ref 0.0–30.0)

## 2018-04-25 LAB — COMPREHENSIVE METABOLIC PANEL
ALT: 19 U/L (ref 0–35)
AST: 16 U/L (ref 0–37)
Albumin: 4.6 g/dL (ref 3.5–5.2)
Alkaline Phosphatase: 88 U/L (ref 39–117)
BUN: 13 mg/dL (ref 6–23)
CALCIUM: 9.7 mg/dL (ref 8.4–10.5)
CO2: 28 meq/L (ref 19–32)
Chloride: 103 mEq/L (ref 96–112)
Creatinine, Ser: 0.66 mg/dL (ref 0.40–1.20)
GFR: 96.61 mL/min (ref >60.00)
Glucose, Bld: 124 mg/dL — ABNORMAL HIGH (ref 70–99)
Potassium: 4.2 mEq/L (ref 3.5–5.1)
Sodium: 139 mEq/L (ref 135–145)
Total Bilirubin: 0.4 mg/dL (ref 0.2–1.2)
Total Protein: 7.3 g/dL (ref 6.0–8.3)

## 2018-04-25 LAB — LIPID PANEL
CHOL/HDL RATIO: 4
CHOLESTEROL: 161 mg/dL (ref 0–200)
HDL: 45.8 mg/dL (ref >39.00)
LDL Cholesterol: 80 mg/dL (ref 0–99)
NonHDL: 115.6
TRIGLYCERIDES: 178 mg/dL — AB (ref 0.0–149.0)
VLDL: 35.6 mg/dL (ref 0.0–40.0)

## 2018-04-25 LAB — CBC
HCT: 42.5 % (ref 36.0–46.0)
HEMOGLOBIN: 14.5 g/dL (ref 12.0–15.0)
MCHC: 34.1 g/dL (ref 30.0–36.0)
MCV: 88 fl (ref 78.0–100.0)
PLATELETS: 369 10*3/uL (ref 150.0–400.0)
RBC: 4.82 Mil/uL (ref 3.87–5.11)
RDW: 13.7 % (ref 11.5–15.5)
WBC: 8.7 10*3/uL (ref 4.0–10.5)

## 2018-04-25 LAB — VITAMIN D 25 HYDROXY (VIT D DEFICIENCY, FRACTURES): VITD: 38.51 ng/mL (ref 30.00–100.00)

## 2018-04-25 LAB — URINALYSIS, ROUTINE W REFLEX MICROSCOPIC
BILIRUBIN URINE: NEGATIVE
HGB URINE DIPSTICK: NEGATIVE
Ketones, ur: NEGATIVE
Leukocytes, UA: NEGATIVE
NITRITE: NEGATIVE
PH: 6.5 (ref 5.0–8.0)
RBC / HPF: NONE SEEN (ref 0–?)
Specific Gravity, Urine: 1.015 (ref 1.000–1.030)
Total Protein, Urine: NEGATIVE
Urine Glucose: NEGATIVE
Urobilinogen, UA: 0.2 (ref 0.0–1.0)

## 2018-04-25 LAB — HEMOGLOBIN A1C: Hgb A1c MFr Bld: 6.6 % — ABNORMAL HIGH (ref 4.6–6.5)

## 2018-04-25 MED ORDER — GLUCOSE BLOOD VI STRP: ORAL_STRIP | 5 refills | Status: AC

## 2018-04-25 MED ORDER — HYDROCHLOROTHIAZIDE 25 MG PO TABS: 25.0000 mg | ORAL_TABLET | Freq: Every day | ORAL | 1 refills | Status: DC

## 2018-04-25 MED ORDER — CANDESARTAN CILEXETIL 32 MG PO TABS: 32.0000 mg | ORAL_TABLET | Freq: Every day | ORAL | 1 refills | Status: DC

## 2018-04-25 MED ORDER — METFORMIN HCL 500 MG PO TABS: 500.0000 mg | ORAL_TABLET | Freq: Two times a day (BID) | ORAL | 1 refills | Status: DC

## 2018-04-25 MED ORDER — ASPIRIN EC 81 MG PO TBEC: 81.0000 mg | DELAYED_RELEASE_TABLET | Freq: Every day | ORAL | 0 refills | Status: DC

## 2018-04-25 MED ORDER — ATORVASTATIN CALCIUM 40 MG PO TABS: 40.0000 mg | ORAL_TABLET | Freq: Every day | ORAL | 1 refills | Status: DC

## 2018-04-25 MED ORDER — CHOLECALCIFEROL 25 MCG (1000 UT) PO TABS: 1000.0000 [IU] | ORAL_TABLET | Freq: Every day | ORAL | 1 refills | Status: DC

## 2018-04-25 NOTE — Patient Instructions (Signed)
Coping with Quitting Smoking Quitting smoking is a physical and mental challenge. You will face cravings, withdrawal symptoms, and temptation. Before quitting, work with your health care provider to make a plan that can help you cope. Preparation can help you quit and keep you from giving in. How can I cope with cravings? Cravings usually last for 5-10 minutes. If you get through it, the craving will pass. Consider taking the following actions to help you cope with cravings:  Keep your mouth busy: ? Chew sugar-free gum. ? Suck on hard candies or a straw. ? Brush your teeth.  Keep your hands and body busy: ? Immediately change to a different activity when you feel a craving. ? Squeeze or play with a ball. ? Do an activity or a hobby, like making bead jewelry, practicing needlepoint, or working with wood. ? Mix up your normal routine. ? Take a short exercise break. Go for a quick walk or run up and down stairs. ? Spend time in public places where smoking is not allowed.  Focus on doing something kind or helpful for someone else.  Call a friend or family member to talk during a craving.  Join a support group.  Call a quit line, such as 1-800-QUIT-NOW.  Talk with your health care provider about medicines that might help you cope with cravings and make quitting easier for you.  How can I deal with withdrawal symptoms? Your body may experience negative effects as it tries to get used to not having nicotine in the system. These effects are called withdrawal symptoms. They may include:  Feeling hungrier than normal.  Trouble concentrating.  Irritability.  Trouble sleeping.  Feeling depressed.  Restlessness and agitation.  Craving a cigarette.  To manage withdrawal symptoms:  Avoid places, people, and activities that trigger your cravings.  Remember why you want to quit.  Get plenty of sleep.  Avoid coffee and other caffeinated drinks. These may worsen some of your  symptoms.  How can I handle social situations? Social situations can be difficult when you are quitting smoking, especially in the first few weeks. To manage this, you can:  Avoid parties, bars, and other social situations where people might be smoking.  Avoid alcohol.  Leave right away if you have the urge to smoke.  Explain to your family and friends that you are quitting smoking. Ask for understanding and support.  Plan activities with friends or family where smoking is not an option.  What are some ways I can cope with stress? Wanting to smoke may cause stress, and stress can make you want to smoke. Find ways to manage your stress. Relaxation techniques can help. For example:  Breathe slowly and deeply, in through your nose and out through your mouth.  Listen to soothing, relaxing music.  Talk with a family member or friend about your stress.  Light a candle.  Soak in a bath or take a shower.  Think about a peaceful place.  What are some ways I can prevent weight gain? Be aware that many people gain weight after they quit smoking. However, not everyone does. To keep from gaining weight, have a plan in place before you quit and stick to the plan after you quit. Your plan should include:  Having healthy snacks. When you have a craving, it may help to: ? Eat plain popcorn, crunchy carrots, celery, or other cut vegetables. ? Chew sugar-free gum.  Changing how you eat: ? Eat small portion sizes at meals. ?   Eat 4-6 small meals throughout the day instead of 1-2 large meals a day. ? Be mindful when you eat. Do not watch television or do other things that might distract you as you eat.  Exercising regularly: ? Make time to exercise each day. If you do not have time for a long workout, do short bouts of exercise for 5-10 minutes several times a day. ? Do some form of strengthening exercise, like weight lifting, and some form of aerobic exercise, like running or  swimming.  Drinking plenty of water or other low-calorie or no-calorie drinks. Drink 6-8 glasses of water daily, or as much as instructed by your health care provider.  Summary  Quitting smoking is a physical and mental challenge. You will face cravings, withdrawal symptoms, and temptation to smoke again. Preparation can help you as you go through these challenges.  You can cope with cravings by keeping your mouth busy (such as by chewing gum), keeping your body and hands busy, and making calls to family, friends, or a helpline for people who want to quit smoking.  You can cope with withdrawal symptoms by avoiding places where people smoke, avoiding drinks with caffeine, and getting plenty of rest.  Ask your health care provider about the different ways to prevent weight gain, avoid stress, and handle social situations. This information is not intended to replace advice given to you by your health care provider. Make sure you discuss any questions you have with your health care provider. Document Released: 08/03/2016 Document Revised: 08/03/2016 Document Reviewed: 08/03/2016 Elsevier Interactive Patient Education  2018 [employer].  Diabetes Mellitus and Exercise Exercising regularly is important for your overall health, especially when you have diabetes (diabetes mellitus). Exercising is not only about losing weight. It has many health benefits, such as increasing muscle strength and bone density and reducing body fat and stress. This leads to improved fitness, flexibility, and endurance, all of which result in better overall health. Exercise has additional benefits for people with diabetes, including:  Reducing appetite.  Helping to lower and control blood glucose.  Lowering blood pressure.  Helping to control amounts of fatty substances (lipids) in the blood, such as cholesterol and triglycerides.  Helping the body to respond better to insulin (improving insulin  sensitivity).  Reducing how much insulin the body needs.  Decreasing the risk for heart disease by: ? Lowering cholesterol and triglyceride levels. ? Increasing the levels of good cholesterol. ? Lowering blood glucose levels.  What is my activity plan? Your health care provider or certified diabetes educator can help you make a plan for the type and frequency of exercise (activity plan) that works for you. Make sure that you:  Do at least 150 minutes of moderate-intensity or vigorous-intensity exercise each week. This could be brisk walking, biking, or water aerobics. ? Do stretching and strength exercises, such as yoga or weightlifting, at least 2 times a week. ? Spread out your activity over at least 3 days of the week.  Get some form of physical activity every day. ? Do not go more than 2 days in a row without some kind of physical activity. ? Avoid being inactive for more than 90 minutes at a time. Take frequent breaks to walk or stretch.  Choose a type of exercise or activity that you enjoy, and set realistic goals.  Start slowly, and gradually increase the intensity of your exercise over time.  What do I need to know about managing my diabetes?  Check your blood  glucose before and after exercising. ? If your blood glucose is higher than 240 mg/dL (13.3 mmol/L) before you exercise, check your urine for ketones. If you have ketones in your urine, do not exercise until your blood glucose returns to normal.  Know the symptoms of low blood glucose (hypoglycemia) and how to treat it. Your risk for hypoglycemia increases during and after exercise. Common symptoms of hypoglycemia can include: ? Hunger. ? Anxiety. ? Sweating and feeling clammy. ? Confusion. ? Dizziness or feeling light-headed. ? Increased heart rate or palpitations. ? Blurry vision. ? Tingling or numbness around the mouth, lips, or tongue. ? Tremors or shakes. ? Irritability.  Keep a rapid-acting carbohydrate  snack available before, during, and after exercise to help prevent or treat hypoglycemia.  Avoid injecting insulin into areas of the body that are going to be exercised. For example, avoid injecting insulin into: ? The arms, when playing tennis. ? The legs, when jogging.  Keep records of your exercise habits. Doing this can help you and your health care provider adjust your diabetes management plan as needed. Write down: ? Food that you eat before and after you exercise. ? Blood glucose levels before and after you exercise. ? The type and amount of exercise you have done. ? When your insulin is expected to peak, if you use insulin. Avoid exercising at times when your insulin is peaking.  When you start a new exercise or activity, work with your health care provider to make sure the activity is safe for you, and to adjust your insulin, medicines, or food intake as needed.  Drink plenty of water while you exercise to prevent dehydration or heat stroke. Drink enough fluid to keep your urine clear or pale yellow. This information is not intended to replace advice given to you by your health care provider. Make sure you discuss any questions you have with your health care provider. Document Released: 10/27/2003 Document Revised: 02/24/2016 Document Reviewed: 01/16/2016 Elsevier Interactive Patient Education  2018 [employer].

## 2018-04-25 NOTE — Progress Notes (Addendum)
Subjective:  Patient ID: Paula Kelly, female    DOB: January 23, 1956  Age: 61 y.o. MRN: [id number]  CC: Follow-up   HPI Paula Kelly presents for follow-up of her hypertension, diabetes and elevated cholesterol.  These have been well controlled on her current regimen of medical therapy.  She is status post eye surgery for closed angle glaucoma.  Her vision is spared.  She continues to plug along at work but is planning on retiring at age 89 if she can sell her house.  80 year old daughter bipolar disease and personality disorder continues to live with her along with her 42-year-old son.  Patient has been quite supportive for these people in her life.  She is at the contemplative stage of quitting smoking.  Is been difficult for her to exercise because of the heat.  Outpatient Medications Prior to Visit  Medication Sig Dispense Refill  . amphetamine-dextroamphetamine (ADDERALL XR) 20 MG 24 hr capsule Take 2 capsules (40 mg total) by mouth every morning. 60 capsule 0  . Coenzyme Q-10 200 MG CAPS 1-2 capsules daily for cardiovascular health    . DOCOSAHEXAENOIC ACID PO Take 1 g by mouth daily.    . DULoxetine (CYMBALTA) 60 MG capsule Take 2 capsules (120 mg total) by mouth daily. 180 capsule 1  . Multiple Vitamin (MULTI-VITAMINS) TABS Take 1 tablet by mouth daily.    . vitamin E 400 UNIT capsule Take 1 capsule by mouth daily.    Marland Kitchen aspirin EC 81 MG tablet Take 1 tablet (81 mg total) by mouth daily. 365 tablet 0  . atorvastatin (LIPITOR) 40 MG tablet Take 1 tablet (40 mg total) by mouth daily. 100 tablet 1  . candesartan (ATACAND) 32 MG tablet Take 1 tablet (32 mg total) by mouth daily. 100 tablet 1  . Cholecalciferol (VITAMIN D-1000 MAX ST) 1000 units tablet Take 1 tablet (1,000 Units total) by mouth daily. 100 tablet 1  . glucose blood (FREESTYLE LITE) test strip Use to test 1-2 times daily. 100 each 5  . hydrochlorothiazide (HYDRODIURIL) 25 MG tablet Take 1 tablet (25 mg total) by mouth  daily. 100 tablet 1  . metFORMIN (GLUCOPHAGE) 500 MG tablet Take 1 tablet (500 mg total) by mouth 2 (two) times daily. 180 tablet 1   Facility-Administered Medications Prior to Visit  Medication Dose Route Frequency Provider Last Rate Last Dose  . 0.9 %  sodium chloride infusion  500 mL Intravenous Continuous Nandigam, Kavitha V, MD        ROS Review of Systems  Constitutional: Negative for diaphoresis, fatigue, fever and unexpected weight change.  HENT: Negative.   Eyes: Negative for photophobia and visual disturbance.  Respiratory: Negative for choking, shortness of breath and wheezing.   Cardiovascular: Negative for chest pain, palpitations and leg swelling.  Gastrointestinal: Negative.   Endocrine: Negative for polyphagia and polyuria.  Genitourinary: Negative.   Musculoskeletal: Positive for myalgias.  Skin: Negative for pallor and rash.  Allergic/Immunologic: Negative for immunocompromised state.  Neurological: Negative for weakness and headaches.  Hematological: Does not bruise/bleed easily.  Psychiatric/Behavioral: Negative.     Objective:  BP 110/72   Pulse 89   Ht 5\' 5"  (1.651 m)   Wt 210 lb 6 oz (95.4 kg)   SpO2 94%   BMI 35.01 kg/m   BP Readings from Last 3 Encounters:  04/25/18 110/72  11/18/17 124/70  10/23/17 136/70    Wt Readings from Last 3 Encounters:  04/25/18 210 lb 6 oz (95.4 kg)  11/18/17 215 lb 2 oz (97.6 kg)  10/23/17 214 lb 4 oz (97.2 kg)    Physical Exam  Constitutional: She is oriented to person, place, and time. She appears well-developed and well-nourished. No distress.  HENT:  Head: Normocephalic and atraumatic.  Right Ear: External ear normal.  Left Ear: External ear normal.  Mouth/Throat: Oropharynx is clear and moist. No oropharyngeal exudate.  Eyes: Pupils are equal, round, and reactive to light. Conjunctivae and EOM are normal. Right eye exhibits no discharge. Left eye exhibits no discharge. No scleral icterus.  Neck: Normal  range of motion. Neck supple. No JVD present. No tracheal deviation present. No thyromegaly present.  Cardiovascular: Normal rate, regular rhythm, normal heart sounds and intact distal pulses.  Pulses:      Dorsalis pedis pulses are 1+ on the right side, and 1+ on the left side.       Posterior tibial pulses are 1+ on the right side, and 1+ on the left side.  Pulmonary/Chest: Effort normal and breath sounds normal.  Abdominal: Bowel sounds are normal.  Musculoskeletal: She exhibits no edema.  Lymphadenopathy:    She has no cervical adenopathy.  Neurological: She is alert and oriented to person, place, and time.  Skin: Skin is warm and dry. Capillary refill takes less than 2 seconds. She is not diaphoretic.  Psychiatric: She has a normal mood and affect. Her behavior is normal.    Lab Results  Component Value Date   WBC 8.7 04/25/2018   HGB 14.5 04/25/2018   HCT 42.5 04/25/2018   PLT 369.0 04/25/2018   GLUCOSE 124 (H) 04/25/2018   CHOL 161 04/25/2018   TRIG 178.0 (H) 04/25/2018   HDL 45.80 04/25/2018   LDLCALC 80 04/25/2018   ALT 19 04/25/2018   AST 16 04/25/2018   NA 139 04/25/2018   K 4.2 04/25/2018   CL 103 04/25/2018   CREATININE 0.66 04/25/2018   BUN 13 04/25/2018   CO2 28 04/25/2018   TSH 1.93 10/23/2017   HGBA1C 6.6 (H) 10/23/2017   MICROALBUR 1.7 04/25/2018    US Venous Img Lower Unilateral Left  Result Date: 11/18/2017 CLINICAL DATA:  Left calf pain and soreness EXAM: LEFT LOWER EXTREMITY VENOUS DUPLEX ULTRASOUND TECHNIQUE: Doppler venous assessment of the left lower extremity deep venous system was performed, including characterization of spectral flow, compressibility, and phasicity. COMPARISON:  None. FINDINGS: There is complete compressibility of the common femoral, femoral, and popliteal veins. Doppler analysis demonstrates respiratory phasicity and augmentation of flow with calf compression. No obvious superficial vein or calf vein thrombosis. IMPRESSION: No  evidence of left lower extremity DVT. Electronically Signed   By: Marybelle Killings M.D.   On: 11/18/2017 15:38    Assessment & Plan:   Paula Kelly was seen today for follow-up.  Diagnoses and all orders for this visit:  Need for influenza vaccination -     Flu Vaccine QUAD 36+ mos IM  Essential hypertension -     aspirin EC 81 MG tablet; Take 1 tablet (81 mg total) by mouth daily. -     candesartan (ATACAND) 32 MG tablet; Take 1 tablet (32 mg total) by mouth daily. -     hydrochlorothiazide (HYDRODIURIL) 25 MG tablet; Take 1 tablet (25 mg total) by mouth daily. -     CBC -     Comprehensive metabolic panel -     Urinalysis, Routine w reflex microscopic -     Microalbumin / creatinine urine ratio  Controlled type 2 diabetes  mellitus without complication, without long-term current use of insulin (HCC) -     aspirin EC 81 MG tablet; Take 1 tablet (81 mg total) by mouth daily. -     candesartan (ATACAND) 32 MG tablet; Take 1 tablet (32 mg total) by mouth daily. -     glucose blood (FREESTYLE LITE) test strip; Use to test 1-2 times daily. -     metFORMIN (GLUCOPHAGE) 500 MG tablet; Take 1 tablet (500 mg total) by mouth 2 (two) times daily. -     CBC -     Comprehensive metabolic panel -     Urinalysis, Routine w reflex microscopic -     Microalbumin / creatinine urine ratio -     Hemoglobin A1c  Tobacco use -     aspirin EC 81 MG tablet; Take 1 tablet (81 mg total) by mouth daily.  Elevated cholesterol -     aspirin EC 81 MG tablet; Take 1 tablet (81 mg total) by mouth daily. -     atorvastatin (LIPITOR) 40 MG tablet; Take 1 tablet (40 mg total) by mouth daily. -     Comprehensive metabolic panel -     Lipid panel  Vitamin D deficiency -     Cholecalciferol (VITAMIN D-1000 MAX ST) 1000 units tablet; Take 1 tablet (1,000 Units total) by mouth daily. -     VITAMIN D 25 Hydroxy (Vit-D Deficiency, Fractures)   I am having Paula Kelly "Paula Kelly" maintain her Coenzyme Q-10,  DOCOSAHEXAENOIC ACID PO, MULTI-VITAMINS, vitamin E, amphetamine-dextroamphetamine, DULoxetine, aspirin EC, atorvastatin, candesartan, Cholecalciferol, glucose blood, hydrochlorothiazide, and metFORMIN. We will continue to administer sodium chloride.  Meds ordered this encounter  Medications  . aspirin EC 81 MG tablet    Sig: Take 1 tablet (81 mg total) by mouth daily.    Dispense:  365 tablet    Refill:  0  . atorvastatin (LIPITOR) 40 MG tablet    Sig: Take 1 tablet (40 mg total) by mouth daily.    Dispense:  100 tablet    Refill:  1  . candesartan (ATACAND) 32 MG tablet    Sig: Take 1 tablet (32 mg total) by mouth daily.    Dispense:  100 tablet    Refill:  1  . Cholecalciferol (VITAMIN D-1000 MAX ST) 1000 units tablet    Sig: Take 1 tablet (1,000 Units total) by mouth daily.    Dispense:  100 tablet    Refill:  1  . glucose blood (FREESTYLE LITE) test strip    Sig: Use to test 1-2 times daily.    Dispense:  100 each    Refill:  5  . hydrochlorothiazide (HYDRODIURIL) 25 MG tablet    Sig: Take 1 tablet (25 mg total) by mouth daily.    Dispense:  100 tablet    Refill:  1  . metFORMIN (GLUCOPHAGE) 500 MG tablet    Sig: Take 1 tablet (500 mg total) by mouth 2 (two) times daily.    Dispense:  180 tablet    Refill:  1   We had discussion throughout the exam time about the need for her to quit smoking.  She was given some information on coping with quitting smoking as well as exercising to lose weight.  Follow-up: Return in about 6 months (around 10/24/2018).  Libby Maw, MD

## 2018-04-25 NOTE — Addendum Note (Signed)
Addended by: Jon Billings on: 04/25/2018 03:19 PM   Modules accepted: Orders

## 2018-05-29 DIAGNOSIS — F3340 Major depressive disorder, recurrent, in remission, unspecified: Secondary | ICD-10-CM | POA: Diagnosis not present

## 2018-05-29 DIAGNOSIS — F901 Attention-deficit hyperactivity disorder, predominantly hyperactive type: Secondary | ICD-10-CM | POA: Diagnosis not present

## 2018-07-15 DIAGNOSIS — K080 Exfoliation of teeth due to systemic causes: Secondary | ICD-10-CM | POA: Diagnosis not present

## 2018-08-04 DIAGNOSIS — K080 Exfoliation of teeth due to systemic causes: Secondary | ICD-10-CM | POA: Diagnosis not present

## 2018-08-25 DIAGNOSIS — F3340 Major depressive disorder, recurrent, in remission, unspecified: Secondary | ICD-10-CM | POA: Diagnosis not present

## 2018-08-25 DIAGNOSIS — F901 Attention-deficit hyperactivity disorder, predominantly hyperactive type: Secondary | ICD-10-CM | POA: Diagnosis not present

## 2018-10-13 DIAGNOSIS — Z01411 Encounter for gynecological examination (general) (routine) with abnormal findings: Secondary | ICD-10-CM | POA: Diagnosis not present

## 2018-10-13 DIAGNOSIS — Z720 Tobacco use: Secondary | ICD-10-CM | POA: Diagnosis not present

## 2018-10-13 DIAGNOSIS — N3941 Urge incontinence: Secondary | ICD-10-CM | POA: Diagnosis not present

## 2018-10-21 DIAGNOSIS — F3340 Major depressive disorder, recurrent, in remission, unspecified: Secondary | ICD-10-CM | POA: Diagnosis not present

## 2018-10-24 ENCOUNTER — Encounter: Payer: Self-pay | Admitting: Family Medicine

## 2018-10-24 ENCOUNTER — Ambulatory Visit: Payer: Federal, State, Local not specified - PPO | Admitting: Family Medicine

## 2018-10-24 VITALS — BP 122/60 | HR 90 | Temp 99.2°F | Ht 65.0 in | Wt 219.0 lb

## 2018-10-24 DIAGNOSIS — E7800 Pure hypercholesterolemia, unspecified: Secondary | ICD-10-CM | POA: Diagnosis not present

## 2018-10-24 DIAGNOSIS — I10 Essential (primary) hypertension: Secondary | ICD-10-CM

## 2018-10-24 DIAGNOSIS — E119 Type 2 diabetes mellitus without complications: Secondary | ICD-10-CM | POA: Diagnosis not present

## 2018-10-24 DIAGNOSIS — Z720 Tobacco use: Secondary | ICD-10-CM

## 2018-10-24 LAB — CBC
HCT: 43.4 % (ref 36.0–46.0)
HEMOGLOBIN: 14.6 g/dL (ref 12.0–15.0)
MCHC: 33.6 g/dL (ref 30.0–36.0)
MCV: 90.7 fl (ref 78.0–100.0)
Platelets: 387 10*3/uL (ref 150.0–400.0)
RBC: 4.79 Mil/uL (ref 3.87–5.11)
RDW: 13.9 % (ref 11.5–15.5)
WBC: 8.6 10*3/uL (ref 4.0–10.5)

## 2018-10-24 LAB — URINALYSIS, ROUTINE W REFLEX MICROSCOPIC
Bilirubin Urine: NEGATIVE
Hgb urine dipstick: NEGATIVE
Ketones, ur: NEGATIVE
Leukocytes,Ua: NEGATIVE
Nitrite: NEGATIVE
Specific Gravity, Urine: 1.02 (ref 1.000–1.030)
Total Protein, Urine: NEGATIVE
Urine Glucose: NEGATIVE
Urobilinogen, UA: 0.2 (ref 0.0–1.0)
pH: 6 (ref 5.0–8.0)

## 2018-10-24 LAB — LIPID PANEL
CHOL/HDL RATIO: 3
Cholesterol: 180 mg/dL (ref 0–200)
HDL: 53.4 mg/dL (ref >39.00)
LDL CALC: 98 mg/dL (ref 0–99)
NonHDL: 126.49
Triglycerides: 140 mg/dL (ref 0.0–149.0)
VLDL: 28 mg/dL (ref 0.0–40.0)

## 2018-10-24 LAB — HEMOGLOBIN A1C: Hgb A1c MFr Bld: 6.5 % (ref 4.6–6.5)

## 2018-10-24 MED ORDER — CANDESARTAN CILEXETIL 32 MG PO TABS: 32.0000 mg | ORAL_TABLET | Freq: Every day | ORAL | 1 refills | Status: DC

## 2018-10-24 MED ORDER — HYDROCHLOROTHIAZIDE 25 MG PO TABS: 25.0000 mg | ORAL_TABLET | Freq: Every day | ORAL | 1 refills | Status: DC

## 2018-10-24 MED ORDER — METFORMIN HCL 500 MG PO TABS: 500.0000 mg | ORAL_TABLET | Freq: Two times a day (BID) | ORAL | 1 refills | Status: DC

## 2018-10-24 MED ORDER — ATORVASTATIN CALCIUM 40 MG PO TABS: 40.0000 mg | ORAL_TABLET | Freq: Every day | ORAL | 1 refills | Status: DC

## 2018-10-24 MED ORDER — ASPIRIN EC 81 MG PO TBEC: 81.0000 mg | DELAYED_RELEASE_TABLET | Freq: Every day | ORAL | 0 refills | Status: AC

## 2018-10-24 NOTE — Progress Notes (Signed)
Established Patient Office Visit  Subjective:  Patient ID: Paula Kelly, female    DOB: December 10, 1955  Age: 63 y.o. MRN: [id number]  CC:  Chief Complaint  Patient presents with  . Follow-up    Patient is here today for a 58-month-F/U. She is currently fasting.  She is compliant with medications.  Her Psych added Abilify 5mg .  Last PAP completed 2.2019 WNL.    HPI [name] presents for follow-up of her hypertension diabetes and elevated cholesterol.  Hypertension is controlled with Atacand and HCTZ.  Metformin has been keeping her hemoglobin A1c in the less than 7 range.  LDL cholesterol 140 of atorvastatin is at 80.  Exercises by walking her dog for 15 minutes 3 times a day.  She did have an eye check this past January.  She received laser therapy for narrow angle glaucoma.  Recently started on Abilify for worsening depression and fatigue.  She believes that it has helped some.  Her troubled daughter has moved out of the house and seems to be doing better.  Patient continues to work hard at her job.  Past Medical History:  Diagnosis Date  . Allergy    seasonal  . Arthritis   . Cataract   . Depression   . Diabetes mellitus without complication ([place])   . H/O fibromyalgia    had for over 10 years  . Hyperlipidemia   . Hypertension   . Post-operative nausea and vomiting     Past Surgical History:  Procedure Laterality Date  . APPENDECTOMY    . COLONOSCOPY    . kidney stone removal  2013   used a basket extraction    Family History  Problem Relation Age of Onset  . Diabetes Mother   . Heart disease Father   . Emphysema Father   . CVA Sister   . AAA (abdominal aortic aneurysm) Brother     Social History   Socioeconomic History  . Marital status: Married    Spouse name: Not on file  . Number of children: Not on file  . Years of education: Not on file  . Highest education level: Not on file  Occupational History  . Not on file  Social Needs  .  Financial resource strain: Not on file  . Food insecurity:    Worry: Not on file    Inability: Not on file  . Transportation needs:    Medical: Not on file    Non-medical: Not on file  Tobacco Use  . Smoking status: Current Every Day Smoker    Packs/day: 1.00    Types: Cigarettes  . Smokeless tobacco: Never Used  Substance and Sexual Activity  . Alcohol use: No  . Drug use: No  . Sexual activity: Not on file  Lifestyle  . Physical activity:    Days per week: Not on file    Minutes per session: Not on file  . Stress: Not on file  Relationships  . Social connections:    Talks on phone: Not on file    Gets together: Not on file    Attends religious service: Not on file    Active member of club or organization: Not on file    Attends meetings of clubs or organizations: Not on file    Relationship status: Not on file  . Intimate partner violence:    Fear of current or ex partner: Not on file    Emotionally abused: Not on file    Physically  abused: Not on file    Forced sexual activity: Not on file  Other Topics Concern  . Not on file  Social History Narrative  . Not on file    Outpatient Medications Prior to Visit  Medication Sig Dispense Refill  . amphetamine-dextroamphetamine (ADDERALL XR) 20 MG 24 hr capsule Take 2 capsules (40 mg total) by mouth every morning. 60 capsule 0  . ARIPiprazole (ABILIFY) 5 MG tablet Take 1 tablet by mouth every morning.    . Cholecalciferol (VITAMIN D-1000 MAX ST) 1000 units tablet Take 1 tablet (1,000 Units total) by mouth daily. 100 tablet 1  . Coenzyme Q-10 200 MG CAPS 1-2 capsules daily for cardiovascular health    . DULoxetine (CYMBALTA) 60 MG capsule Take 2 capsules (120 mg total) by mouth daily. 180 capsule 1  . glucose blood (FREESTYLE LITE) test strip Use to test 1-2 times daily. 100 each 5  . Multiple Vitamin (MULTI-VITAMINS) TABS Take 1 tablet by mouth daily.    . vitamin E 400 UNIT capsule Take 1 capsule by mouth daily.    Marland Kitchen  aspirin EC 81 MG tablet Take 1 tablet (81 mg total) by mouth daily. 365 tablet 0  . atorvastatin (LIPITOR) 40 MG tablet Take 1 tablet (40 mg total) by mouth daily. 100 tablet 1  . candesartan (ATACAND) 32 MG tablet Take 1 tablet (32 mg total) by mouth daily. 100 tablet 1  . hydrochlorothiazide (HYDRODIURIL) 25 MG tablet Take 1 tablet (25 mg total) by mouth daily. 100 tablet 1  . metFORMIN (GLUCOPHAGE) 500 MG tablet Take 1 tablet (500 mg total) by mouth 2 (two) times daily. 180 tablet 1  . DOCOSAHEXAENOIC ACID PO Take 1 g by mouth daily.    Marland Kitchen 0.9 %  sodium chloride infusion      No facility-administered medications prior to visit.     Allergies  Allergen Reactions  . Metronidazole Rash    ROS Review of Systems  Constitutional: Negative for diaphoresis, fatigue, fever and unexpected weight change.  HENT: Negative.   Eyes: Negative for photophobia.  Cardiovascular: Negative.   Gastrointestinal: Negative.   Endocrine: Negative for polyphagia and polyuria.  Genitourinary: Negative.   Musculoskeletal: Positive for arthralgias and myalgias.  Skin: Negative for pallor and rash.  Allergic/Immunologic: Negative for immunocompromised state.  Neurological: Negative for weakness, light-headedness and headaches.  Hematological: Does not bruise/bleed easily.  Psychiatric/Behavioral: Positive for dysphoric mood.      Objective:    Physical Exam  Constitutional: She is oriented to person, place, and time. She appears well-developed and well-nourished. No distress.  HENT:  Head: Normocephalic and atraumatic.  Right Ear: External ear normal.  Left Ear: External ear normal.  Mouth/Throat: Oropharynx is clear and moist. No oropharyngeal exudate.  Eyes: Pupils are equal, round, and reactive to light. Conjunctivae are normal. Right eye exhibits no discharge. Left eye exhibits no discharge. No scleral icterus.  Neck: Neck supple. No JVD present. No tracheal deviation present. No thyromegaly  present.  Cardiovascular: Normal rate, regular rhythm and normal heart sounds.  Pulses:      Dorsalis pedis pulses are 1+ on the right side and 1+ on the left side.       Posterior tibial pulses are 1+ on the right side and 1+ on the left side.  Pulmonary/Chest: Effort normal and breath sounds normal. No stridor.  Musculoskeletal:        General: No edema.  Lymphadenopathy:    She has no cervical adenopathy.  Neurological: She  is alert and oriented to person, place, and time.  Skin: Skin is warm and dry. She is not diaphoretic.  Psychiatric: She has a normal mood and affect. Her behavior is normal.   Diabetic Foot Exam - Simple   Simple Foot Form  Visual Inspection  See comments: Yes  Sensation Testing  Intact to touch and monofilament testing bilaterally: Yes  Pulse Check  See comments: Yes  Comments  Feet are cavus.   Capillary refill excellent.  BP 122/60 (BP Location: Right Arm, Patient Position: Sitting, Cuff Size: Normal)   Pulse 90   Temp 99.2 F (37.3 C) (Oral)   Ht 5\' 5"  (1.651 m)   Wt 219 lb (99.3 kg)   SpO2 96%   BMI 36.44 kg/m  Wt Readings from Last 3 Encounters:  10/24/18 219 lb (99.3 kg)  04/25/18 210 lb 6 oz (95.4 kg)  11/18/17 215 lb 2 oz (97.6 kg)     Health Maintenance Due  Topic Date Due  . FOOT EXAM  10/25/1966  . HEMOGLOBIN A1C  10/24/2018    There are no preventive care reminders to display for this patient.  Lab Results  Component Value Date   TSH 1.93 10/23/2017   Lab Results  Component Value Date   WBC 8.7 04/25/2018   HGB 14.5 04/25/2018   HCT 42.5 04/25/2018   MCV 88.0 04/25/2018   PLT 369.0 04/25/2018   Lab Results  Component Value Date   NA 139 04/25/2018   K 4.2 04/25/2018   CO2 28 04/25/2018   GLUCOSE 124 (H) 04/25/2018   BUN 13 04/25/2018   CREATININE 0.66 04/25/2018   BILITOT 0.4 04/25/2018   ALKPHOS 88 04/25/2018   AST 16 04/25/2018   ALT 19 04/25/2018   PROT 7.3 04/25/2018   ALBUMIN 4.6 04/25/2018    CALCIUM 9.7 04/25/2018   GFR 96.61 04/25/2018   Lab Results  Component Value Date   CHOL 161 04/25/2018   Lab Results  Component Value Date   HDL 45.80 04/25/2018   Lab Results  Component Value Date   LDLCALC 80 04/25/2018   Lab Results  Component Value Date   TRIG 178.0 (H) 04/25/2018   Lab Results  Component Value Date   CHOLHDL 4 04/25/2018   Lab Results  Component Value Date   HGBA1C 6.6 (H) 04/25/2018   The 10-year ASCVD risk score Mikey Bussing DC Jr., et al., 2013) is: 16.1%   Values used to calculate the score:     Age: 15 years     Sex: Female     Is Non-Hispanic African American: No     Diabetic: Yes     Tobacco smoker: Yes     Systolic Blood Pressure: 505 mmHg     Is BP treated: Yes     HDL Cholesterol: 45.8 mg/dL     Total Cholesterol: 161 mg/dL   Assessment & Plan:   Problem List Items Addressed This Visit      Cardiovascular and Mediastinum   Essential hypertension - Primary   Relevant Medications   aspirin EC 81 MG tablet   atorvastatin (LIPITOR) 40 MG tablet   candesartan (ATACAND) 32 MG tablet   hydrochlorothiazide (HYDRODIURIL) 25 MG tablet   Other Relevant Orders   CBC     Endocrine   Controlled type 2 diabetes mellitus without complication, without long-term current use of insulin (HCC)   Relevant Medications   aspirin EC 81 MG tablet   atorvastatin (LIPITOR) 40 MG tablet  candesartan (ATACAND) 32 MG tablet   metFORMIN (GLUCOPHAGE) 500 MG tablet   Other Relevant Orders   Hemoglobin A1c     Other   Elevated cholesterol   Relevant Medications   aspirin EC 81 MG tablet   atorvastatin (LIPITOR) 40 MG tablet   candesartan (ATACAND) 32 MG tablet   hydrochlorothiazide (HYDRODIURIL) 25 MG tablet   Other Relevant Orders   Lipid panel   Urinalysis, Routine w reflex microscopic   Tobacco use   Relevant Medications   aspirin EC 81 MG tablet      Meds ordered this encounter  Medications  . aspirin EC 81 MG tablet    Sig: Take 1 tablet  (81 mg total) by mouth daily.    Dispense:  365 tablet    Refill:  0  . atorvastatin (LIPITOR) 40 MG tablet    Sig: Take 1 tablet (40 mg total) by mouth daily.    Dispense:  100 tablet    Refill:  1  . candesartan (ATACAND) 32 MG tablet    Sig: Take 1 tablet (32 mg total) by mouth daily.    Dispense:  100 tablet    Refill:  1  . hydrochlorothiazide (HYDRODIURIL) 25 MG tablet    Sig: Take 1 tablet (25 mg total) by mouth daily.    Dispense:  100 tablet    Refill:  1  . metFORMIN (GLUCOPHAGE) 500 MG tablet    Sig: Take 1 tablet (500 mg total) by mouth 2 (two) times daily.    Dispense:  180 tablet    Refill:  1    Follow-up: Return in about 6 months (around 04/26/2019).   Patient was given information on exercising to lose weight.  Encouraged her to go for 30 minutes daily and walking her dog would be a great way to get that.  Information given on coping with quitting smoking.  Follow-up in 6 months. Libby Maw, MD

## 2018-10-24 NOTE — Patient Instructions (Signed)
Exercising to Lose Weight Exercise is structured, repetitive physical activity to improve fitness and health. Getting regular exercise is important for everyone. It is especially important if you are overweight. Being overweight increases your risk of heart disease, stroke, diabetes, high blood pressure, and several types of cancer. Reducing your calorie intake and exercising can help you lose weight. Exercise is usually categorized as moderate or vigorous intensity. To lose weight, most people need to do a certain amount of moderate-intensity or vigorous-intensity exercise each week. Moderate-intensity exercise  Moderate-intensity exercise is any activity that gets you moving enough to burn at least three times more energy (calories) than if you were sitting. Examples of moderate exercise include:  Walking a mile in 15 minutes.  Doing light yard work.  Biking at an easy pace. Most people should get at least 150 minutes (2 hours and 30 minutes) a week of moderate-intensity exercise to maintain their body weight. Vigorous-intensity exercise Vigorous-intensity exercise is any activity that gets you moving enough to burn at least six times more calories than if you were sitting. When you exercise at this intensity, you should be working hard enough that you are not able to carry on a conversation. Examples of vigorous exercise include:  Running.  Playing a team sport, such as football, basketball, and soccer.  Jumping rope. Most people should get at least 75 minutes (1 hour and 15 minutes) a week of vigorous-intensity exercise to maintain their body weight. How can exercise affect me? When you exercise enough to burn more calories than you eat, you lose weight. Exercise also reduces body fat and builds muscle. The more muscle you have, the more calories you burn. Exercise also:  Improves mood.  Reduces stress and tension.  Improves your overall fitness, flexibility, and  endurance.  Increases bone strength. The amount of exercise you need to lose weight depends on:  Your age.  The type of exercise.  Any health conditions you have.  Your overall physical ability. Talk to your health care provider about how much exercise you need and what types of activities are safe for you. What actions can I take to lose weight? Nutrition   Make changes to your diet as told by your health care provider or diet and nutrition specialist (dietitian). This may include: ? Eating fewer calories. ? Eating more protein. ? Eating less unhealthy fats. ? Eating a diet that includes fresh fruits and vegetables, whole grains, low-fat dairy products, and lean protein. ? Avoiding foods with added fat, salt, and sugar.  Drink plenty of water while you exercise to prevent dehydration or heat stroke. Activity  Choose an activity that you enjoy and set realistic goals. Your health care provider can help you make an exercise plan that works for you.  Exercise at a moderate or vigorous intensity most days of the week. ? The intensity of exercise may vary from person to person. You can tell how intense a workout is for you by paying attention to your breathing and heartbeat. Most people will notice their breathing and heartbeat get faster with more intense exercise.  Do resistance training twice each week, such as: ? Push-ups. ? Sit-ups. ? Lifting weights. ? Using resistance bands.  Getting short amounts of exercise can be just as helpful as long structured periods of exercise. If you have trouble finding time to exercise, try to include exercise in your daily routine. ? Get up, stretch, and walk around every 30 minutes throughout the day. ? Go for a   walk during your lunch break. ? Park your car farther away from your destination. ? If you take public transportation, get off one stop early and walk the rest of the way. ? Make phone calls while standing up and walking  around. ? Take the stairs instead of elevators or escalators.  Wear comfortable clothes and shoes with good support.  Do not exercise so much that you hurt yourself, feel dizzy, or get very short of breath. Where to find more information  [place] Department of Health and Human Services: [employer]  Centers for Disease Control and Prevention (CDC): http://www.wolf.info/ Contact a health care provider:  Before starting a new exercise program.  If you have questions or concerns about your weight.  If you have a medical problem that keeps you from exercising. Get help right away if you have any of the following while exercising:  Injury.  Dizziness.  Difficulty breathing or shortness of breath that does not go away when you stop exercising.  Chest pain.  Rapid heartbeat. Summary  Being overweight increases your risk of heart disease, stroke, diabetes, high blood pressure, and several types of cancer.  Losing weight happens when you burn more calories than you eat.  Reducing the amount of calories you eat in addition to getting regular moderate or vigorous exercise each week helps you lose weight. This information is not intended to replace advice given to you by your health care provider. Make sure you discuss any questions you have with your health care provider. Document Released: 09/08/2010 Document Revised: 08/19/2017 Document Reviewed: 08/19/2017 Elsevier Interactive Patient Education  2019 [employer].  Exercising to [employer] Exercise is structured, repetitive physical activity to improve fitness and health. Getting regular exercise is important for everyone. It is especially important if you are overweight. Being overweight increases your risk of heart disease, stroke, diabetes, high blood pressure, and several types of cancer. Reducing your calorie intake and exercising can help you lose weight. Exercise is usually categorized as moderate or vigorous intensity. To lose weight,  most people need to do a certain amount of moderate-intensity or vigorous-intensity exercise each week. Moderate-intensity exercise  Moderate-intensity exercise is any activity that gets you moving enough to burn at least three times more energy (calories) than if you were sitting. Examples of moderate exercise include:  Walking a mile in 15 minutes.  Doing light yard work.  Biking at an easy pace. Most people should get at least 150 minutes (2 hours and 30 minutes) a week of moderate-intensity exercise to maintain their body weight. Vigorous-intensity exercise Vigorous-intensity exercise is any activity that gets you moving enough to burn at least six times more calories than if you were sitting. When you exercise at this intensity, you should be working hard enough that you are not able to carry on a conversation. Examples of vigorous exercise include:  Running.  Playing a team sport, such as football, basketball, and soccer.  Jumping rope. Most people should get at least 75 minutes (1 hour and 15 minutes) a week of vigorous-intensity exercise to maintain their body weight. How can exercise affect me? When you exercise enough to burn more calories than you eat, you lose weight. Exercise also reduces body fat and builds muscle. The more muscle you have, the more calories you burn. Exercise also:  Improves mood.  Reduces stress and tension.  Improves your overall fitness, flexibility, and endurance.  Increases bone strength. The amount of exercise you need to lose weight depends on:  Your  age.  The type of exercise.  Any health conditions you have.  Your overall physical ability. Talk to your health care provider about how much exercise you need and what types of activities are safe for you. What actions can I take to lose weight? Nutrition   Make changes to your diet as told by your health care provider or diet and nutrition specialist (dietitian). This may  include: ? Eating fewer calories. ? Eating more protein. ? Eating less unhealthy fats. ? Eating a diet that includes fresh fruits and vegetables, whole grains, low-fat dairy products, and lean protein. ? Avoiding foods with added fat, salt, and sugar.  Drink plenty of water while you exercise to prevent dehydration or heat stroke. Activity  Choose an activity that you enjoy and set realistic goals. Your health care provider can help you make an exercise plan that works for you.  Exercise at a moderate or vigorous intensity most days of the week. ? The intensity of exercise may vary from person to person. You can tell how intense a workout is for you by paying attention to your breathing and heartbeat. Most people will notice their breathing and heartbeat get faster with more intense exercise.  Do resistance training twice each week, such as: ? Push-ups. ? Sit-ups. ? Lifting weights. ? Using resistance bands.  Getting short amounts of exercise can be just as helpful as long structured periods of exercise. If you have trouble finding time to exercise, try to include exercise in your daily routine. ? Get up, stretch, and walk around every 30 minutes throughout the day. ? Go for a walk during your lunch break. ? Park your car farther away from your destination. ? If you take public transportation, get off one stop early and walk the rest of the way. ? Make phone calls while standing up and walking around. ? Take the stairs instead of elevators or escalators.  Wear comfortable clothes and shoes with good support.  Do not exercise so much that you hurt yourself, feel dizzy, or get very short of breath. Where to find more information  [place] Department of Health and Human Services: [employer]  Centers for Disease Control and Prevention (CDC): http://www.wolf.info/ Contact a health care provider:  Before starting a new exercise program.  If you have questions or concerns about your  weight.  If you have a medical problem that keeps you from exercising. Get help right away if you have any of the following while exercising:  Injury.  Dizziness.  Difficulty breathing or shortness of breath that does not go away when you stop exercising.  Chest pain.  Rapid heartbeat. Summary  Being overweight increases your risk of heart disease, stroke, diabetes, high blood pressure, and several types of cancer.  Losing weight happens when you burn more calories than you eat.  Reducing the amount of calories you eat in addition to getting regular moderate or vigorous exercise each week helps you lose weight. This information is not intended to replace advice given to you by your health care provider. Make sure you discuss any questions you have with your health care provider. Document Released: 09/08/2010 Document Revised: 08/19/2017 Document Reviewed: 08/19/2017 Elsevier Interactive Patient Education  2019 [place] with Quitting Smoking  Quitting smoking is a physical and mental challenge. You will face cravings, withdrawal symptoms, and temptation. Before quitting, work with your health care provider to make a plan that can help you cope. Preparation can help you quit and keep  you from giving in. How can I cope with cravings? Cravings usually last for 5-10 minutes. If you get through it, the craving will pass. Consider taking the following actions to help you cope with cravings:  Keep your mouth busy: ? Chew sugar-free gum. ? Suck on hard candies or a straw. ? Brush your teeth.  Keep your hands and body busy: ? Immediately change to a different activity when you feel a craving. ? Squeeze or play with a ball. ? Do an activity or a hobby, like making bead jewelry, practicing needlepoint, or working with wood. ? Mix up your normal routine. ? Take a short exercise break. Go for a quick walk or run up and down stairs. ? Spend time in public places where smoking  is not allowed.  Focus on doing something kind or helpful for someone else.  Call a friend or family member to talk during a craving.  Join a support group.  Call a quit line, such as 1-800-QUIT-NOW.  Talk with your health care provider about medicines that might help you cope with cravings and make quitting easier for you. How can I deal with withdrawal symptoms? Your body may experience negative effects as it tries to get used to not having nicotine in the system. These effects are called withdrawal symptoms. They may include:  Feeling hungrier than normal.  Trouble concentrating.  Irritability.  Trouble sleeping.  Feeling depressed.  Restlessness and agitation.  Craving a cigarette. To manage withdrawal symptoms:  Avoid places, people, and activities that trigger your cravings.  Remember why you want to quit.  Get plenty of sleep.  Avoid coffee and other caffeinated drinks. These may worsen some of your symptoms. How can I handle social situations? Social situations can be difficult when you are quitting smoking, especially in the first few weeks. To manage this, you can:  Avoid parties, bars, and other social situations where people might be smoking.  Avoid alcohol.  Leave right away if you have the urge to smoke.  Explain to your family and friends that you are quitting smoking. Ask for understanding and support.  Plan activities with friends or family where smoking is not an option. What are some ways I can cope with stress? Wanting to smoke may cause stress, and stress can make you want to smoke. Find ways to manage your stress. Relaxation techniques can help. For example:  Breathe slowly and deeply, in through your nose and out through your mouth.  Listen to soothing, relaxing music.  Talk with a family member or friend about your stress.  Light a candle.  Soak in a bath or take a shower.  Think about a peaceful place. What are some ways I can  prevent weight gain? Be aware that many people gain weight after they quit smoking. However, not everyone does. To keep from gaining weight, have a plan in place before you quit and stick to the plan after you quit. Your plan should include:  Having healthy snacks. When you have a craving, it may help to: ? Eat plain popcorn, crunchy carrots, celery, or other cut vegetables. ? Chew sugar-free gum.  Changing how you eat: ? Eat small portion sizes at meals. ? Eat 4-6 small meals throughout the day instead of 1-2 large meals a day. ? Be mindful when you eat. Do not watch television or do other things that might distract you as you eat.  Exercising regularly: ? Make time to exercise each day. If you do  not have time for a long workout, do short bouts of exercise for 5-10 minutes several times a day. ? Do some form of strengthening exercise, like weight lifting, and some form of aerobic exercise, like running or swimming.  Drinking plenty of water or other low-calorie or no-calorie drinks. Drink 6-8 glasses of water daily, or as much as instructed by your health care provider. Summary  Quitting smoking is a physical and mental challenge. You will face cravings, withdrawal symptoms, and temptation to smoke again. Preparation can help you as you go through these challenges.  You can cope with cravings by keeping your mouth busy (such as by chewing gum), keeping your body and hands busy, and making calls to family, friends, or a helpline for people who want to quit smoking.  You can cope with withdrawal symptoms by avoiding places where people smoke, avoiding drinks with caffeine, and getting plenty of rest.  Ask your health care provider about the different ways to prevent weight gain, avoid stress, and handle social situations. This information is not intended to replace advice given to you by your health care provider. Make sure you discuss any questions you have with your health care  provider. Document Released: 08/03/2016 Document Revised: 08/03/2016 Document Reviewed: 08/03/2016 Elsevier Interactive Patient Education  2019 [employer].

## 2018-12-03 DIAGNOSIS — F900 Attention-deficit hyperactivity disorder, predominantly inattentive type: Secondary | ICD-10-CM | POA: Diagnosis not present

## 2018-12-03 DIAGNOSIS — F331 Major depressive disorder, recurrent, moderate: Secondary | ICD-10-CM | POA: Diagnosis not present

## 2019-01-20 DIAGNOSIS — F3340 Major depressive disorder, recurrent, in remission, unspecified: Secondary | ICD-10-CM | POA: Diagnosis not present

## 2019-01-20 DIAGNOSIS — F900 Attention-deficit hyperactivity disorder, predominantly inattentive type: Secondary | ICD-10-CM | POA: Diagnosis not present

## 2019-02-26 ENCOUNTER — Encounter: Payer: Self-pay | Admitting: Family Medicine

## 2019-03-11 DIAGNOSIS — F330 Major depressive disorder, recurrent, mild: Secondary | ICD-10-CM | POA: Diagnosis not present

## 2019-03-11 DIAGNOSIS — F900 Attention-deficit hyperactivity disorder, predominantly inattentive type: Secondary | ICD-10-CM | POA: Diagnosis not present

## 2019-03-28 ENCOUNTER — Other Ambulatory Visit: Payer: Self-pay | Admitting: Family Medicine

## 2019-03-28 DIAGNOSIS — E7800 Pure hypercholesterolemia, unspecified: Secondary | ICD-10-CM

## 2019-04-08 ENCOUNTER — Other Ambulatory Visit: Payer: Self-pay

## 2019-04-08 DIAGNOSIS — E7800 Pure hypercholesterolemia, unspecified: Secondary | ICD-10-CM

## 2019-04-08 MED ORDER — ATORVASTATIN CALCIUM 40 MG PO TABS: ORAL_TABLET | ORAL | 1 refills | Status: DC

## 2019-04-24 ENCOUNTER — Telehealth: Payer: Self-pay

## 2019-04-24 NOTE — Telephone Encounter (Signed)

## 2019-04-27 ENCOUNTER — Ambulatory Visit: Payer: Federal, State, Local not specified - PPO | Admitting: Family Medicine

## 2019-04-28 ENCOUNTER — Ambulatory Visit: Payer: Federal, State, Local not specified - PPO | Admitting: Family Medicine

## 2019-04-28 ENCOUNTER — Other Ambulatory Visit: Payer: Self-pay

## 2019-04-28 ENCOUNTER — Encounter: Payer: Self-pay | Admitting: Family Medicine

## 2019-04-28 VITALS — BP 130/70 | HR 105 | Ht 65.0 in | Wt 220.0 lb

## 2019-04-28 DIAGNOSIS — E119 Type 2 diabetes mellitus without complications: Secondary | ICD-10-CM

## 2019-04-28 DIAGNOSIS — I10 Essential (primary) hypertension: Secondary | ICD-10-CM

## 2019-04-28 DIAGNOSIS — M722 Plantar fascial fibromatosis: Secondary | ICD-10-CM

## 2019-04-28 DIAGNOSIS — Z720 Tobacco use: Secondary | ICD-10-CM | POA: Diagnosis not present

## 2019-04-28 DIAGNOSIS — G4719 Other hypersomnia: Secondary | ICD-10-CM

## 2019-04-28 DIAGNOSIS — E7800 Pure hypercholesterolemia, unspecified: Secondary | ICD-10-CM | POA: Diagnosis not present

## 2019-04-28 DIAGNOSIS — R0683 Snoring: Secondary | ICD-10-CM

## 2019-04-28 DIAGNOSIS — Z789 Other specified health status: Secondary | ICD-10-CM

## 2019-04-28 DIAGNOSIS — E559 Vitamin D deficiency, unspecified: Secondary | ICD-10-CM | POA: Insufficient documentation

## 2019-04-28 DIAGNOSIS — Z23 Encounter for immunization: Secondary | ICD-10-CM

## 2019-04-28 LAB — URINALYSIS, ROUTINE W REFLEX MICROSCOPIC
Hgb urine dipstick: NEGATIVE
Leukocytes,Ua: NEGATIVE
Nitrite: NEGATIVE
Specific Gravity, Urine: 1.025 (ref 1.000–1.030)
Urine Glucose: NEGATIVE
Urobilinogen, UA: 0.2 (ref 0.0–1.0)
pH: 5.5 (ref 5.0–8.0)

## 2019-04-28 LAB — LIPID PANEL
Cholesterol: 194 mg/dL (ref 0–200)
HDL: 50.8 mg/dL (ref >39.00)
NonHDL: 142.78
Total CHOL/HDL Ratio: 4
Triglycerides: 219 mg/dL — ABNORMAL HIGH (ref 0.0–149.0)
VLDL: 43.8 mg/dL — ABNORMAL HIGH (ref 0.0–40.0)

## 2019-04-28 LAB — LDL CHOLESTEROL, DIRECT: Direct LDL: 125 mg/dL

## 2019-04-28 LAB — CBC
HCT: 43.9 % (ref 36.0–46.0)
Hemoglobin: 14.6 g/dL (ref 12.0–15.0)
MCHC: 33.2 g/dL (ref 30.0–36.0)
MCV: 91.2 fl (ref 78.0–100.0)
Platelets: 350 10*3/uL (ref 150.0–400.0)
RBC: 4.81 Mil/uL (ref 3.87–5.11)
RDW: 14.2 % (ref 11.5–15.5)
WBC: 9.7 10*3/uL (ref 4.0–10.5)

## 2019-04-28 LAB — COMPREHENSIVE METABOLIC PANEL
ALT: 29 U/L (ref 0–35)
AST: 20 U/L (ref 0–37)
Albumin: 4.6 g/dL (ref 3.5–5.2)
Alkaline Phosphatase: 76 U/L (ref 39–117)
BUN: 13 mg/dL (ref 6–23)
CO2: 27 mEq/L (ref 19–32)
Calcium: 9.8 mg/dL (ref 8.4–10.5)
Chloride: 102 mEq/L (ref 96–112)
Creatinine, Ser: 0.67 mg/dL (ref 0.40–1.20)
GFR: 89.04 mL/min (ref >60.00)
Glucose, Bld: 112 mg/dL — ABNORMAL HIGH (ref 70–99)
Potassium: 4.8 mEq/L (ref 3.5–5.1)
Sodium: 139 mEq/L (ref 135–145)
Total Bilirubin: 0.4 mg/dL (ref 0.2–1.2)
Total Protein: 7.3 g/dL (ref 6.0–8.3)

## 2019-04-28 LAB — MICROALBUMIN / CREATININE URINE RATIO
Creatinine,U: 259.1 mg/dL
Microalb Creat Ratio: 1.3 mg/g (ref 0.0–30.0)
Microalb, Ur: 3.4 mg/dL — ABNORMAL HIGH (ref 0.0–1.9)

## 2019-04-28 LAB — HEMOGLOBIN A1C: Hgb A1c MFr Bld: 6.9 % — ABNORMAL HIGH (ref 4.6–6.5)

## 2019-04-28 LAB — VITAMIN D 25 HYDROXY (VIT D DEFICIENCY, FRACTURES): VITD: 29.02 ng/mL — ABNORMAL LOW (ref 30.00–100.00)

## 2019-04-28 NOTE — Patient Instructions (Signed)
Plantar Fasciitis  Plantar fasciitis is a painful foot condition that affects the heel. It occurs when the band of tissue that connects the toes to the heel bone (plantar fascia) becomes irritated. This can happen as the result of exercising too much or doing other repetitive activities (overuse injury). The pain from plantar fasciitis can range from mild irritation to severe pain that makes it difficult to walk or move. The pain is usually worse in the morning after sleeping, or after sitting or lying down for a while. Pain may also be worse after long periods of walking or standing. What are the causes? This condition may be caused by:  Standing for long periods of time.  Wearing shoes that do not have good arch support.  Doing activities that put stress on joints (high-impact activities), including running, aerobics, and ballet.  Being overweight.  An abnormal way of walking (gait).  Tight muscles in the back of your lower leg (calf).  High arches in your feet.  Starting a new athletic activity. What are the signs or symptoms? The main symptom of this condition is heel pain. Pain may:  Be worse with first steps after a time of rest, especially in the morning after sleeping or after you have been sitting or lying down for a while.  Be worse after long periods of standing still.  Decrease after 30-45 minutes of activity, such as gentle walking. How is this diagnosed? This condition may be diagnosed based on your medical history and your symptoms. Your health care provider may ask questions about your activity level. Your health care provider will do a physical exam to check for:  A tender area on the bottom of your foot.  A high arch in your foot.  Pain when you move your foot.  Difficulty moving your foot. You may have imaging tests to confirm the diagnosis, such as:  X-rays.  Ultrasound.  MRI. How is this treated? Treatment for plantar fasciitis depends on how  severe your condition is. Treatment may include:  Rest, ice, applying pressure (compression), and raising the affected foot (elevation). This may be called RICE therapy. Your health care provider may recommend RICE therapy along with over-the-counter pain medicines to manage your pain.  Exercises to stretch your calves and your plantar fascia.  A splint that holds your foot in a stretched, upward position while you sleep (night splint).  Physical therapy to relieve symptoms and prevent problems in the future.  Injections of steroid medicine (cortisone) to relieve pain and inflammation.  Stimulating your plantar fascia with electrical impulses (extracorporeal shock wave therapy). This is usually the last treatment option before surgery.  Surgery, if other treatments have not worked after 12 months. Follow these instructions at home:  Managing pain, stiffness, and swelling  If directed, put ice on the painful area: ? Put ice in a plastic bag, or use a frozen bottle of water. ? Place a towel between your skin and the bag or bottle. ? Roll the bottom of your foot over the bag or bottle. ? Do this for 20 minutes, 2-3 times a day.  Wear athletic shoes that have air-sole or gel-sole cushions, or try wearing soft shoe inserts that are designed for plantar fasciitis.  Raise (elevate) your foot above the level of your heart while you are sitting or lying down. Activity  Avoid activities that cause pain. Ask your health care provider what activities are safe for you.  Do physical therapy exercises and stretches as told   by your health care provider.  Try activities and forms of exercise that are easier on your joints (low-impact). Examples include swimming, water aerobics, and biking. General instructions  Take over-the-counter and prescription medicines only as told by your health care provider.  Wear a night splint while sleeping, if told by your health care provider. Loosen the splint  if your toes tingle, become numb, or turn cold and blue.  Maintain a healthy weight, or work with your health care provider to lose weight as needed.  Keep all follow-up visits as told by your health care provider. This is important. Contact a health care provider if you:  Have symptoms that do not go away after caring for yourself at home.  Have pain that gets worse.  Have pain that affects your ability to move or do your daily activities. Summary  Plantar fasciitis is a painful foot condition that affects the heel. It occurs when the band of tissue that connects the toes to the heel bone (plantar fascia) becomes irritated.  The main symptom of this condition is heel pain that may be worse after exercising too much or standing still for a long time.  Treatment varies, but it usually starts with rest, ice, compression, and elevation (RICE therapy) and over-the-counter medicines to manage pain. This information is not intended to replace advice given to you by your health care provider. Make sure you discuss any questions you have with your health care provider. Document Released: 05/01/2001 Document Revised: 07/19/2017 Document Reviewed: 06/03/2017 Elsevier Patient Education  2020 [employer].  Steps to Quit Smoking Smoking tobacco is the leading cause of preventable death. It can affect almost every organ in the body. Smoking puts you and those around you at risk for developing many serious chronic diseases. Quitting smoking can be difficult, but it is one of the best things that you can do for your health. It is never too late to quit. How do I get ready to quit? When you decide to quit smoking, create a plan to help you succeed. Before you quit:  Pick a date to quit. Set a date within the next 2 weeks to give you time to prepare.  Write down the reasons why you are quitting. Keep this list in places where you will see it often.  Tell your family, friends, and co-workers that you  are quitting. Support from your loved ones can make quitting easier.  Talk with your health care provider about your options for quitting smoking.  Find out what treatment options are covered by your health insurance.  Identify people, places, things, and activities that make you want to smoke (triggers). Avoid them. What first steps can I take to quit smoking?  Throw away all cigarettes at home, at work, and in your car.  Throw away smoking accessories, such as Scientist, research (medical).  Clean your car. Make sure to empty the ashtray.  Clean your home, including curtains and carpets. What strategies can I use to quit smoking? Talk with your health care provider about combining strategies, such as taking medicines while you are also receiving in-person counseling. Using these two strategies together makes you more likely to succeed in quitting than if you used either strategy on its own.  If you are pregnant or breastfeeding, talk with your health care provider about finding counseling or other support strategies to quit smoking. Do not take medicine to help you quit smoking unless your health care provider tells you to do so. To  quit smoking: Quit right away  Quit smoking completely, instead of gradually reducing how much you smoke over a period of time. Research shows that stopping smoking right away is more successful than gradually quitting.  Attend in-person counseling to help you build problem-solving skills. You are more likely to succeed in quitting if you attend counseling sessions regularly. Even short sessions of 10 minutes can be effective. Take medicine You may take medicines to help you quit smoking. Some medicines require a prescription and some you can purchase over-the-counter. Medicines may have nicotine in them to replace the nicotine in cigarettes. Medicines may:  Help to stop cravings.  Help to relieve withdrawal symptoms. Your health care provider may recommend:   Nicotine patches, gum, or lozenges.  Nicotine inhalers or sprays.  Non-nicotine medicine that is taken by mouth. Find resources Find resources and support systems that can help you to quit smoking and remain smoke-free after you quit. These resources are most helpful when you use them often. They include:  Online chats with a Social worker.  Telephone quitlines.  Printed Furniture conservator/restorer.  Support groups or group counseling.  Text messaging programs.  Mobile phone apps or applications. Use apps that can help you stick to your quit plan by providing reminders, tips, and encouragement. There are many free apps for mobile devices as well as websites. Examples include Quit Guide from the [employer] and smokefree.gov What things can I do to make it easier to quit?   Reach out to your family and friends for support and encouragement. Call telephone quitlines (1-800-QUIT-NOW), reach out to support groups, or work with a counselor for support.  Ask people who smoke to avoid smoking around you.  Avoid places that trigger you to smoke, such as bars, parties, or smoke-break areas at work.  Spend time with people who do not smoke.  Lessen the stress in your life. Stress can be a smoking trigger for some people. To lessen stress, try: ? Exercising regularly. ? Doing deep-breathing exercises. ? Doing yoga. ? Meditating. ? Performing a body scan. This involves closing your eyes, scanning your body from head to toe, and noticing which parts of your body are particularly tense. Try to relax the muscles in those areas. How will I feel when I quit smoking? Day 1 to 3 weeks Within the first 24 hours of quitting smoking, you may start to feel withdrawal symptoms. These symptoms are usually most noticeable 2-3 days after quitting, but they usually do not last for more than 2-3 weeks. You may experience these symptoms:  Mood swings.  Restlessness, anxiety, or irritability.  Trouble concentrating.   Dizziness.  Strong cravings for sugary foods and nicotine.  Mild weight gain.  Constipation.  Nausea.  Coughing or a sore throat.  Changes in how the medicines that you take for unrelated issues work in your body.  Depression.  Trouble sleeping (insomnia). Week 3 and afterward After the first 2-3 weeks of quitting, you may start to notice more positive results, such as:  Improved sense of smell and taste.  Decreased coughing and sore throat.  Slower heart rate.  Lower blood pressure.  Clearer skin.  The ability to breathe more easily.  Fewer sick days. Quitting smoking can be very challenging. Do not get discouraged if you are not successful the first time. Some people need to make many attempts to quit before they achieve long-term success. Do your best to stick to your quit plan, and talk with your health care provider if  you have any questions or concerns. Summary  Smoking tobacco is the leading cause of preventable death. Quitting smoking is one of the best things that you can do for your health.  When you decide to quit smoking, create a plan to help you succeed.  Quit smoking right away, not slowly over a period of time.  When you start quitting, seek help from your health care provider, family, or friends. This information is not intended to replace advice given to you by your health care provider. Make sure you discuss any questions you have with your health care provider. Document Released: 07/31/2001 Document Revised: 10/24/2018 Document Reviewed: 10/25/2018 Elsevier Patient Education  2020 [employer].  Exercising to Stay Healthy To become healthy and stay healthy, it is recommended that you do moderate-intensity and vigorous-intensity exercise. You can tell that you are exercising at a moderate intensity if your heart starts beating faster and you start breathing faster but can still hold a conversation. You can tell that you are exercising at a vigorous  intensity if you are breathing much harder and faster and cannot hold a conversation while exercising. Exercising regularly is important. It has many health benefits, such as:  Improving overall fitness, flexibility, and endurance.  Increasing bone density.  Helping with weight control.  Decreasing body fat.  Increasing muscle strength.  Reducing stress and tension.  Improving overall health. How often should I exercise? Choose an activity that you enjoy, and set realistic goals. Your health care provider can help you make an activity plan that works for you. Exercise regularly as told by your health care provider. This may include:  Doing strength training two times a week, such as: ? Lifting weights. ? Using resistance bands. ? Push-ups. ? Sit-ups. ? Yoga.  Doing a certain intensity of exercise for a given amount of time. Choose from these options: ? A total of 150 minutes of moderate-intensity exercise every week. ? A total of 75 minutes of vigorous-intensity exercise every week. ? A mix of moderate-intensity and vigorous-intensity exercise every week. Children, pregnant women, people who have not exercised regularly, people who are overweight, and older adults may need to talk with a health care provider about what activities are safe to do. If you have a medical condition, be sure to talk with your health care provider before you start a new exercise program. What are some exercise ideas? Moderate-intensity exercise ideas include:  Walking 1 mile (1.6 km) in about 15 minutes.  Biking.  Hiking.  Golfing.  Dancing.  Water aerobics. Vigorous-intensity exercise ideas include:  Walking 4.5 miles (7.2 km) or more in about 1 hour.  Jogging or running 5 miles (8 km) in about 1 hour.  Biking 10 miles (16.1 km) or more in about 1 hour.  Lap swimming.  Roller-skating or in-line skating.  Cross-country skiing.  Vigorous competitive sports, such as football,  basketball, and soccer.  Jumping rope.  Aerobic dancing. What are some everyday activities that can help me to get exercise?  [place] work, such as: ? Pushing a Conservation officer, nature. ? Raking and bagging leaves.  Washing your car.  Pushing a stroller.  Shoveling snow.  Gardening.  Washing windows or floors. How can I be more active in my day-to-day activities?  Use stairs instead of an elevator.  Take a walk during your lunch break.  If you drive, park your car farther away from your work or school.  If you take public transportation, get off one stop early and  walk the rest of the way.  Stand up or walk around during all of your indoor phone calls.  Get up, stretch, and walk around every 30 minutes throughout the day.  Enjoy exercise with a friend. Support to continue exercising will help you keep a regular routine of activity. What guidelines can I follow while exercising?  Before you start a new exercise program, talk with your health care provider.  Do not exercise so much that you hurt yourself, feel dizzy, or get very short of breath.  Wear comfortable clothes and wear shoes with good support.  Drink plenty of water while you exercise to prevent dehydration or heat stroke.  Work out until your breathing and your heartbeat get faster. Where to find more information  [place] Department of Health and Human Services: [employer]at  Centers for Disease Control and Prevention (CDC): http://www.wolf.info/ Summary  Exercising regularly is important. It will improve your overall fitness, flexibility, and endurance.  Regular exercise also will improve your overall health. It can help you control your weight, reduce stress, and improve your bone density.  Do not exercise so much that you hurt yourself, feel dizzy, or get very short of breath.  Before you start a new exercise program, talk with your health care provider. This information is not intended to replace advice given to you by  your health care provider. Make sure you discuss any questions you have with your health care provider. Document Released: 09/08/2010 Document Revised: 07/19/2017 Document Reviewed: 06/27/2017 Elsevier Patient Education  2020 [employer].

## 2019-04-28 NOTE — Progress Notes (Addendum)
Established Patient Office Visit  Subjective:  Patient ID: Paula Kelly, female    DOB: 27-Feb-1956  Age: 63 y.o. MRN: [id number]  CC:  Chief Complaint  Patient presents with  . Follow-up    HPI MURNA PEACE presents for follow-up of her hypertension, elevated cholesterol and diabetes.  Blood pressure is been well controlled with the Atacand and HCTZ.  Continues atorvastatin and metformin for her cholesterol and diabetes.  Has been having an issue with daytime sleepiness.  She does snore.  A sleep study 5 years ago was normal except for her snoring.  She is also been exploring this issue with her psychiatrist.  She has moved into a townhome and it is more difficult for her to exercise in her new living quarters.  Daughter continues to do well.  She watches her daughters son at night because the daughter works third shift.  She has been having some issue with plantar fasciitis in the left heel that she has been treating with stretching.  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  She has to get up and stretch.  Seeing GYN for her Pap smears and female exams.  She has an eye appointment scheduled.  Has found it difficult to see the dentist because of COVID restrictions.  She denies claudication pains in her calves when walking.  Past Medical History:  Diagnosis Date  . Allergy    seasonal  . Arthritis   . Cataract   . Depression   . Diabetes mellitus without complication ([place])   . H/O fibromyalgia    had for over 10 years  . Hyperlipidemia   . Hypertension   . Post-operative nausea and vomiting     Past Surgical History:  Procedure Laterality Date  . APPENDECTOMY    . COLONOSCOPY    . kidney stone removal  2013   used a basket extraction    Family History  Problem Relation Age of Onset  . Diabetes Mother   . Heart disease Father   . Emphysema Father   . CVA Sister   .  AAA (abdominal aortic aneurysm) Brother     Social History   Socioeconomic History  . Marital status: Married    Spouse name: Not on file  . Number of children: Not on file  . Years of education: Not on file  . Highest education level: Not on file  Occupational History  . Not on file  Social Needs  . Financial resource strain: Not on file  . Food insecurity    Worry: Not on file    Inability: Not on file  . Transportation needs    Medical: Not on file    Non-medical: Not on file  Tobacco Use  . Smoking status: Current Every Day Smoker    Packs/day: 1.00    Types: Cigarettes  . Smokeless tobacco: Never Used  Substance and Sexual Activity  . Alcohol use: No  . Drug use: No  . Sexual activity: Not on file  Lifestyle  . Physical activity    Days per week: Not on file    Minutes per session: Not on file  . Stress: Not on file  Relationships  . Social Herbalist on phone: Not on file    Gets together: Not on file    Attends religious service: Not on file  Active member of club or organization: Not on file    Attends meetings of clubs or organizations: Not on file    Relationship status: Not on file  . Intimate partner violence    Fear of current or ex partner: Not on file    Emotionally abused: Not on file    Physically abused: Not on file    Forced sexual activity: Not on file  Other Topics Concern  . Not on file  Social History Narrative  . Not on file    Outpatient Medications Prior to Visit  Medication Sig Dispense Refill  . amphetamine-dextroamphetamine (ADDERALL XR) 20 MG 24 hr capsule Take 2 capsules (40 mg total) by mouth every morning. 60 capsule 0  . ARIPiprazole (ABILIFY) 10 MG tablet Take 10 mg by mouth daily.    Marland Kitchen aspirin EC 81 MG tablet Take 1 tablet (81 mg total) by mouth daily. 365 tablet 0  . atorvastatin (LIPITOR) 40 MG tablet TAKE 1 TABLET(40 MG) BY MOUTH DAILY 100 tablet 1  . candesartan (ATACAND) 32 MG tablet Take 1 tablet (32 mg  total) by mouth daily. 100 tablet 1  . Coenzyme Q-10 200 MG CAPS 1-2 capsules daily for cardiovascular health    . DULoxetine (CYMBALTA) 60 MG capsule Take 2 capsules (120 mg total) by mouth daily. 180 capsule 1  . glucose blood (FREESTYLE LITE) test strip Use to test 1-2 times daily. 100 each 5  . hydrochlorothiazide (HYDRODIURIL) 25 MG tablet Take 1 tablet (25 mg total) by mouth daily. 100 tablet 1  . metFORMIN (GLUCOPHAGE) 500 MG tablet Take 1 tablet (500 mg total) by mouth 2 (two) times daily. 180 tablet 1  . Multiple Vitamin (MULTI-VITAMINS) TABS Take 1 tablet by mouth daily.    . vitamin E 400 UNIT capsule Take 1 capsule by mouth daily.    . ARIPiprazole (ABILIFY) 5 MG tablet Take 1 tablet by mouth every morning.    . Cholecalciferol (VITAMIN D-1000 MAX ST) 1000 units tablet Take 1 tablet (1,000 Units total) by mouth daily. 100 tablet 1   No facility-administered medications prior to visit.     Allergies  Allergen Reactions  . Metronidazole Rash    ROS Review of Systems  Constitutional: Negative.   HENT: Negative.   Eyes: Negative for photophobia and visual disturbance.  Respiratory: Negative.  Negative for chest tightness and shortness of breath.   Cardiovascular: Negative.   Gastrointestinal: Negative.   Endocrine: Negative for polyphagia and polyuria.  Genitourinary: Negative for frequency and urgency.  Musculoskeletal: Positive for myalgias.  Skin: Negative for pallor and rash.  Allergic/Immunologic: Negative for immunocompromised state.  Neurological: Negative for light-headedness and headaches.  Hematological: Does not bruise/bleed easily.      Objective:    Physical Exam  Constitutional: She is oriented to person, place, and time. She appears well-developed and well-nourished. No distress.  HENT:  Head: Normocephalic and atraumatic.  Right Ear: External ear normal.  Left Ear: External ear normal.  Mouth/Throat: Oropharynx is clear and moist. No oropharyngeal  exudate.  Eyes: Pupils are equal, round, and reactive to light. Conjunctivae are normal. Right eye exhibits no discharge. Left eye exhibits no discharge. No scleral icterus.  Neck: Neck supple. No JVD present. No tracheal deviation present. No thyromegaly present.  Cardiovascular: Normal rate, regular rhythm and normal heart sounds.  Pulses:      Dorsalis pedis pulses are 1+ on the right side and 1+ on the left side.  Posterior tibial pulses are 1+ on the right side and 1+ on the left side.  Pulmonary/Chest: Effort normal and breath sounds normal. No stridor. She has no decreased breath sounds. She has no wheezes. She has no rhonchi. She has no rales.  Abdominal: Bowel sounds are normal.  Musculoskeletal:        General: No edema.  Lymphadenopathy:    She has no cervical adenopathy.  Neurological: She is alert and oriented to person, place, and time.  Skin: Skin is warm and dry. She is not diaphoretic.  Psychiatric: She has a normal mood and affect. Her behavior is normal.     Diabetic Foot Exam - Simple   Simple Foot Form Diabetic Foot exam was performed with the following findings: Yes 04/28/2019  9:24 AM  Visual Inspection No deformities, no ulcerations, no other skin breakdown bilaterally: Yes Sensation Testing Intact to touch and monofilament testing bilaterally: Yes Pulse Check See comments: Yes Comments  Pulses are diminished.     BP 130/70   Pulse (!) 105   Ht 5\' 5"  (1.651 m)   Wt 220 lb (99.8 kg)   SpO2 97%   BMI 36.61 kg/m  Wt Readings from Last 3 Encounters:  04/28/19 220 lb (99.8 kg)  10/24/18 219 lb (99.3 kg)  04/25/18 210 lb 6 oz (95.4 kg)   BP Readings from Last 3 Encounters:  04/28/19 130/70  10/24/18 122/60  04/25/18 110/72   Guideline developer:  UpToDate (see UpToDate for funding source) Date Released: June 2014  There are no preventive care reminders to display for this patient.  There are no preventive care reminders to display for this  patient.  Lab Results  Component Value Date   TSH 1.93 10/23/2017   Lab Results  Component Value Date   WBC 9.7 04/28/2019   HGB 14.6 04/28/2019   HCT 43.9 04/28/2019   MCV 91.2 04/28/2019   PLT 350.0 04/28/2019   Lab Results  Component Value Date   NA 139 04/28/2019   K 4.8 04/28/2019   CO2 27 04/28/2019   GLUCOSE 112 (H) 04/28/2019   BUN 13 04/28/2019   CREATININE 0.67 04/28/2019   BILITOT 0.4 04/28/2019   ALKPHOS 76 04/28/2019   AST 20 04/28/2019   ALT 29 04/28/2019   PROT 7.3 04/28/2019   ALBUMIN 4.6 04/28/2019   CALCIUM 9.8 04/28/2019   GFR 89.04 04/28/2019   Lab Results  Component Value Date   CHOL 194 04/28/2019   Lab Results  Component Value Date   HDL 50.80 04/28/2019   Lab Results  Component Value Date   LDLCALC 98 10/24/2018   Lab Results  Component Value Date   TRIG 219.0 (H) 04/28/2019   Lab Results  Component Value Date   CHOLHDL 4 04/28/2019   Lab Results  Component Value Date   HGBA1C 6.9 (H) 04/28/2019      Assessment & Plan:   Problem List Items Addressed This Visit      Cardiovascular and Mediastinum   Essential hypertension - Primary   Relevant Orders   CBC (Completed)   Comprehensive metabolic panel (Completed)   Urinalysis, Routine w reflex microscopic (Completed)   Microalbumin / creatinine urine ratio (Completed)     Endocrine   Controlled type 2 diabetes mellitus without complication, without long-term current use of insulin (HCC)   Relevant Orders   Comprehensive metabolic panel (Completed)   Hemoglobin A1c (Completed)   Urinalysis, Routine w reflex microscopic (Completed)   Microalbumin / creatinine urine  ratio (Completed)     Musculoskeletal and Integument   Plantar fasciitis of left foot     Other   Elevated cholesterol   Relevant Orders   Comprehensive metabolic panel (Completed)   Lipid panel (Completed)   Tobacco use   Relevant Orders   Ambulatory referral to Pulmonology   Health maintenance  alteration   Excessive daytime sleepiness   Relevant Orders   Ambulatory referral to Pulmonology   Snores   Relevant Orders   Ambulatory referral to Pulmonology   Vitamin D deficiency   Relevant Medications   Cholecalciferol (VITAMIN D3) 50 MCG (2000 UT) TABS   Other Relevant Orders   VITAMIN D 25 Hydroxy (Vit-D Deficiency, Fractures) (Completed)    Other Visit Diagnoses    Need for influenza vaccination       Relevant Orders   Flu Vaccine QUAD 36+ mos IM (Completed)      Meds ordered this encounter  Medications  . Cholecalciferol (VITAMIN D3) 50 MCG (2000 UT) TABS    Sig: Take 1 tablet by mouth daily.    Dispense:  90 tablet    Refill:  2    Follow-up: Return in about 6 months (around 10/26/2019).   We will send to pulmonology for a sleep study and low-dose CT scanning of her chest.  She was given information on steps to quit smoking and encouraging her to do so.  Discussed diminished pulses in her feet and that quitting smoking would be most helpful.  Encouraged her to continue her statin.  Was given information on steps to quit smoking, exercising and plantar fasciitis.  She is not interested in using a muscle relaxer at this time for the spasms she has in her back.  Follow-up in 6 months or sooner as needed.

## 2019-04-29 MED ORDER — VITAMIN D3 50 MCG (2000 UT) PO TABS: 1.0000 | ORAL_TABLET | Freq: Every day | ORAL | 2 refills | Status: AC

## 2019-04-29 NOTE — Addendum Note (Signed)
Addended by: Jon Billings on: 04/29/2019 08:24 AM   Modules accepted: Orders

## 2019-05-08 ENCOUNTER — Encounter: Payer: Self-pay | Admitting: Family Medicine

## 2019-05-22 ENCOUNTER — Other Ambulatory Visit: Payer: Self-pay

## 2019-05-22 DIAGNOSIS — Z20822 Contact with and (suspected) exposure to covid-19: Secondary | ICD-10-CM

## 2019-05-23 LAB — NOVEL CORONAVIRUS, NAA: SARS-CoV-2, NAA: NOT DETECTED

## 2019-05-29 DIAGNOSIS — F900 Attention-deficit hyperactivity disorder, predominantly inattentive type: Secondary | ICD-10-CM | POA: Diagnosis not present

## 2019-05-29 DIAGNOSIS — F330 Major depressive disorder, recurrent, mild: Secondary | ICD-10-CM | POA: Diagnosis not present

## 2019-06-17 ENCOUNTER — Other Ambulatory Visit: Payer: Self-pay

## 2019-06-17 ENCOUNTER — Ambulatory Visit: Payer: Federal, State, Local not specified - PPO | Admitting: Pulmonary Disease

## 2019-06-17 ENCOUNTER — Encounter: Payer: Self-pay | Admitting: Pulmonary Disease

## 2019-06-17 VITALS — BP 146/72 | HR 114 | Temp 97.7°F | Ht 65.0 in | Wt 223.2 lb

## 2019-06-17 DIAGNOSIS — R0683 Snoring: Secondary | ICD-10-CM

## 2019-06-17 DIAGNOSIS — G4719 Other hypersomnia: Secondary | ICD-10-CM | POA: Diagnosis not present

## 2019-06-17 NOTE — Progress Notes (Signed)
Paula Kelly, Critical Care, and Sleep Medicine  Chief Complaint  Patient presents with  . Consult    Snoring, Excessive Daytime Sleepiness    Constitutional:  BP (!) 146/72 (BP Location: Right Arm, Patient Position: Sitting, Cuff Size: Large)   Pulse (!) 114   Temp 97.7 F (36.5 C)   Ht 5\' 5"  (1.651 m)   Wt 223 lb 3.2 oz (101.2 kg)   SpO2 97% Comment: on room air  BMI 37.14 kg/m   Past Medical History:  HTN, HLD, Fibromyalgia, DM, Depression, ADHD, Allergies, OA  Brief Summary:  Paula Kelly is a 63 y.o. female smoker with snoring and daytime sleepiness.  She has noticed trouble feeling sleepy for years.  This has been getting worse.  She is followed by psychiatry for ADHD and depression.  Has been on adderall, and drinks coffee.  Still feels sleepy.  Needs to nap for 1 to 1.5 hours in the afternoon.  Tried split dose of adderall, but didn't improve her symptoms.  She snores, and has been told she stops breathing while asleep.  She doesn't dream.  She goes to sleep at 9 pm.  She falls asleep in 10 minutes.  She wakes up 1 time to use the bathroom.  She gets out of bed at 630 am.  She feels tired in the morning.  She denies morning headache.  She has been using melatonin for years to help her sleep.  Takes this as she is going to bed.  She denies sleep walking, sleep talking, bruxism, or nightmares.  There is no history of restless legs.  She denies sleep hallucinations, sleep paralysis, or cataplexy.  The Epworth score is 18 out of 24.     Physical Exam:   Appearance - well kempt   ENMT - clear nasal mucosa, midline nasal  septum, no oral exudates, no LAN, trachea midline  Respiratory - normal chest wall, normal respiratory effort, no accessory muscle use, no wheeze/rales  CV - s1s2 regular rate and rhythm, no murmurs, no peripheral edema, radial pulses symmetric  GI - soft, non tender, no masses  Lymph - no adenopathy noted in neck and axillary areas   MSK - normal gait  Ext - no cyanosis, clubbing, or joint inflammation noted  Skin - no rashes, lesions, or ulcers  Neuro - normal strength, oriented x 3  Psych - normal mood and affect  Discussion:  She has snoring, sleep disruption, apnea, and daytime sleepiness.  She has hx of HTN, DM, depression, and ADHD.  I am concerned she could have sleep apnea.  She is a smoker and previous CT imaging showed mild changes of emphysema.  Assessment/Plan:   Snoring with excessive daytime sleepiness. - will need to arrange for a home sleep study  Obesity. - discussed how weight can impact sleep and risk for sleep disordered breathing - discussed options to assist with weight loss: combination of diet modification, cardiovascular and strength training exercises  Cardiovascular risk. - had an extensive discussion regarding the adverse health consequences related to untreated sleep disordered breathing - specifically discussed the risks for hypertension, coronary artery disease, cardiac dysrhythmias, cerebrovascular disease, and diabetes - lifestyle modification discussed  Safe driving practices. - discussed how sleep disruption can increase risk of accidents, particularly when driving - safe driving practices were discussed  Therapies for obstructive sleep apnea. - if the sleep study shows significant sleep apnea, then various therapies for treatment were reviewed: CPAP, oral appliance, and surgical interventions  Tobacco  abuse with changes of emphysema on CT chest. - needs to quit smoking - will discuss in more detail at next visit and then determine if she needs PFT and repeat chest imaging   Patient Instructions  Will arrange for home sleep study Will call to arrange for follow up after sleep study reviewed     Paula Mires, MD [place] Pager: [phone] 06/17/2019, 11:33 AM  Flow Sheet     Kelly tests:    Chest imaging:  Low dose CT chest  08/09/17 >> mild centrilobular emphysema, atherosclerosis  Sleep testing:     Review of Systems:  Dyspnea, Decreased appetite, Dry mouth, Nasal congestion, Depression, Joint stiffness.  Remainder reviewed and negative.  Medications:   Allergies as of 06/17/2019      Reactions   Metronidazole Rash      Medication List       Accurate as of June 17, 2019 11:33 AM. If you have any questions, ask your nurse or doctor.        amphetamine-dextroamphetamine 20 MG 24 hr capsule Commonly known as: ADDERALL XR Take 2 capsules (40 mg total) by mouth every morning.   amphetamine-dextroamphetamine 30 MG 24 hr capsule Commonly known as: ADDERALL XR Will start this prescription once the 20 mg prescription has run out.   ARIPiprazole 15 MG tablet Commonly known as: ABILIFY Take by mouth. What changed: Another medication with the same name was removed. Continue taking this medication, and follow the directions you see here. Changed by: Paula Mires, MD   aspirin EC 81 MG tablet Take 1 tablet (81 mg total) by mouth daily.   atorvastatin 40 MG tablet Commonly known as: LIPITOR TAKE 1 TABLET(40 MG) BY MOUTH DAILY   candesartan 32 MG tablet Commonly known as: ATACAND Take 1 tablet (32 mg total) by mouth daily.   Coenzyme Q-10 200 MG Caps 1-2 capsules daily for cardiovascular health   DULoxetine 60 MG capsule Commonly known as: CYMBALTA Take 2 capsules (120 mg total) by mouth daily.   glucose blood test strip Commonly known as: FREESTYLE LITE Use to test 1-2 times daily.   hydrochlorothiazide 25 MG tablet Commonly known as: HYDRODIURIL Take 1 tablet (25 mg total) by mouth daily.   metFORMIN 500 MG tablet Commonly known as: GLUCOPHAGE Take 1 tablet (500 mg total) by mouth 2 (two) times daily.   Multi-Vitamins Tabs Take 1 tablet by mouth daily.   Vitamin D3 50 MCG (2000 UT) Tabs Take 1 tablet by mouth daily.   vitamin E 400 UNIT capsule Take 1 capsule by mouth  daily.       Past Surgical History:  She  has a past surgical history that includes Appendectomy; Colonoscopy; and kidney stone removal (2013).  Family History:  Her family history includes AAA (abdominal aortic aneurysm) in her brother; CVA in her sister; Diabetes in her mother; Emphysema in her father; Heart disease in her father.  Social History:  She  reports that she has been smoking cigarettes. She has been smoking about 1.00 pack per day. She has never used smokeless tobacco. She reports that she does not drink alcohol or use drugs.

## 2019-06-17 NOTE — Patient Instructions (Signed)
Will arrange for home sleep study Will call to arrange for follow up after sleep study reviewed  

## 2019-06-18 DIAGNOSIS — K080 Exfoliation of teeth due to systemic causes: Secondary | ICD-10-CM | POA: Diagnosis not present

## 2019-07-07 ENCOUNTER — Ambulatory Visit: Payer: Federal, State, Local not specified - PPO | Admitting: Family Medicine

## 2019-07-07 ENCOUNTER — Other Ambulatory Visit: Payer: Self-pay

## 2019-07-07 ENCOUNTER — Encounter: Payer: Self-pay | Admitting: Family Medicine

## 2019-07-07 VITALS — BP 140/76 | HR 109 | Ht 65.0 in | Wt 224.0 lb

## 2019-07-07 DIAGNOSIS — S46919A Strain of unspecified muscle, fascia and tendon at shoulder and upper arm level, unspecified arm, initial encounter: Secondary | ICD-10-CM | POA: Diagnosis not present

## 2019-07-07 MED ORDER — KETOROLAC TROMETHAMINE 60 MG/2ML IM SOLN
60.0000 mg | Freq: Once | INTRAMUSCULAR | Status: AC
  Administered 2019-07-07: 60 mg via INTRAMUSCULAR

## 2019-07-07 MED ORDER — KETOROLAC TROMETHAMINE 10 MG PO TABS: 10.0000 mg | ORAL_TABLET | Freq: Three times a day (TID) | ORAL | 0 refills | Status: DC | PRN

## 2019-07-07 MED ORDER — METHOCARBAMOL 500 MG PO TABS: 500.0000 mg | ORAL_TABLET | Freq: Three times a day (TID) | ORAL | 0 refills | Status: DC | PRN

## 2019-07-07 NOTE — Progress Notes (Signed)
Established Patient Office Visit  Subjective:  Patient ID: Paula Kelly, female    DOB: 07-24-1956  Age: 63 y.o. MRN: [id number]  CC:  Chief Complaint  Patient presents with  . Arm Pain    HPI Paula Kelly presents for treatment and evaluation bilateral upper back and shoulder pain over the last 2 to 3 days.  Patient is right-hand dominant.  There is been no injury per se.  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  She is hunched over computer for 8 hours.  This is happened to her before.  It is been treated in the past successfully with massage therapy.  She has existing tingling in her fingers of both hands that is previously been diagnosed as carpal tunnel with EMGs.  Tingling tends to involve the first 3 fingers of her hands.  She feels stiff in her upper back and neck.  History of a bulging disc/herniated disc in her neck that was treated with physical therapy several years ago.  Denies loss of strength.  Past Medical History:  Diagnosis Date  . Allergy    seasonal  . Arthritis   . Cataract   . Depression   . Diabetes mellitus without complication ([place])   . H/O fibromyalgia    had for over 10 years  . Hyperlipidemia   . Hypertension   . Post-operative nausea and vomiting     Past Surgical History:  Procedure Laterality Date  . APPENDECTOMY    . COLONOSCOPY    . kidney stone removal  2013   used a basket extraction    Family History  Problem Relation Age of Onset  . Diabetes Mother   . Heart disease Father   . Emphysema Father   . CVA Sister   . AAA (abdominal aortic aneurysm) Brother     Social History   Socioeconomic History  . Marital status: Married    Spouse name: Not on file  . Number of children: Not on file  . Years of education: Not on file  . Highest education level: Not on file  Occupational History  . Not on file  Social Needs  . Financial resource strain: Not on file  . Food insecurity    Worry: Not on file   Inability: Not on file  . Transportation needs    Medical: Not on file    Non-medical: Not on file  Tobacco Use  . Smoking status: Current Every Day Smoker    Packs/day: 1.00    Types: Cigarettes  . Smokeless tobacco: Never Used  Substance and Sexual Activity  . Alcohol use: No  . Drug use: No  . Sexual activity: Not on file  Lifestyle  . Physical activity    Days per week: Not on file    Minutes per session: Not on file  . Stress: Not on file  Relationships  . Social Herbalist on phone: Not on file    Gets together: Not on file    Attends religious service: Not on file    Active member of club or organization: Not on file    Attends meetings of clubs or organizations: Not on file    Relationship status: Not on file  . Intimate partner violence    Fear of current or ex partner: Not on file    Emotionally abused: Not on file    Physically abused: Not on file    Forced sexual activity: Not on file  Other Topics Concern  . Not on file  Social History Narrative  . Not on file    Outpatient Medications Prior to Visit  Medication Sig Dispense Refill  . amphetamine-dextroamphetamine (ADDERALL XR) 20 MG 24 hr capsule Take 2 capsules (40 mg total) by mouth every morning. 60 capsule 0  . amphetamine-dextroamphetamine (ADDERALL XR) 30 MG 24 hr capsule Will start this prescription once the 20 mg prescription has run out.    Marland Kitchen aspirin EC 81 MG tablet Take 1 tablet (81 mg total) by mouth daily. 365 tablet 0  . atorvastatin (LIPITOR) 40 MG tablet TAKE 1 TABLET(40 MG) BY MOUTH DAILY 100 tablet 1  . candesartan (ATACAND) 32 MG tablet Take 1 tablet (32 mg total) by mouth daily. 100 tablet 1  . Cholecalciferol (VITAMIN D3) 50 MCG (2000 UT) TABS Take 1 tablet by mouth daily. 90 tablet 2  . Coenzyme Q-10 200 MG CAPS 1-2 capsules daily for cardiovascular health    . DULoxetine (CYMBALTA) 60 MG capsule Take 2 capsules (120 mg total) by mouth daily. 180 capsule 1  . glucose blood  (FREESTYLE LITE) test strip Use to test 1-2 times daily. 100 each 5  . hydrochlorothiazide (HYDRODIURIL) 25 MG tablet Take 1 tablet (25 mg total) by mouth daily. 100 tablet 1  . metFORMIN (GLUCOPHAGE) 500 MG tablet Take 1 tablet (500 mg total) by mouth 2 (two) times daily. 180 tablet 1  . Multiple Vitamin (MULTI-VITAMINS) TABS Take 1 tablet by mouth daily.    . vitamin E 400 UNIT capsule Take 1 capsule by mouth daily.    . ARIPiprazole (ABILIFY) 15 MG tablet Take by mouth.     No facility-administered medications prior to visit.     Allergies  Allergen Reactions  . Metronidazole Rash    ROS Review of Systems  Constitutional: Negative.  Negative for diaphoresis, fatigue, fever and unexpected weight change.  HENT: Negative.   Eyes: Negative for photophobia and visual disturbance.  Respiratory: Negative.   Cardiovascular: Negative.   Gastrointestinal: Negative.   Genitourinary: Negative.   Musculoskeletal: Positive for myalgias, neck pain and neck stiffness.  Neurological: Positive for numbness. Negative for weakness.  Hematological: Negative.   Psychiatric/Behavioral: Positive for sleep disturbance. The patient is nervous/anxious and is hyperactive.       Objective:    Physical Exam  Constitutional: She is oriented to person, place, and time. She appears well-developed and well-nourished.  HENT:  Head: Normocephalic and atraumatic.  Right Ear: External ear normal.  Left Ear: External ear normal.  Eyes: Conjunctivae are normal. Right eye exhibits no discharge. Left eye exhibits no discharge. No scleral icterus.  Neck: No JVD present. No tracheal deviation present.  Pulmonary/Chest: Effort normal. No stridor.  Musculoskeletal:     Right shoulder: She exhibits decreased range of motion and tenderness (sub acomial bursa).     Left shoulder: She exhibits decreased range of motion and tenderness (sub acromial bursa).     Cervical back: She exhibits tenderness. She exhibits normal  range of motion and no bony tenderness.     Thoracic back: She exhibits tenderness and spasm.       Back:       Arms:  Neurological: She is alert and oriented to person, place, and time.  Skin: Skin is warm and dry.  Psychiatric: She has a normal mood and affect. Her behavior is normal.    BP 140/76   Pulse (!) 109   Ht 5\' 5"  (1.651 m)   Wt  224 lb (101.6 kg)   SpO2 96%   BMI 37.28 kg/m  Wt Readings from Last 3 Encounters:  07/07/19 224 lb (101.6 kg)  06/17/19 223 lb 3.2 oz (101.2 kg)  04/28/19 220 lb (99.8 kg)   BP Readings from Last 3 Encounters:  07/07/19 140/76  06/17/19 (!) 146/72  04/28/19 130/70   Guideline developer:  UpToDate (see UpToDate for funding source) Date Released: June 2014  There are no preventive care reminders to display for this patient.  There are no preventive care reminders to display for this patient.  Lab Results  Component Value Date   TSH 1.93 10/23/2017   Lab Results  Component Value Date   WBC 9.7 04/28/2019   HGB 14.6 04/28/2019   HCT 43.9 04/28/2019   MCV 91.2 04/28/2019   PLT 350.0 04/28/2019   Lab Results  Component Value Date   NA 139 04/28/2019   K 4.8 04/28/2019   CO2 27 04/28/2019   GLUCOSE 112 (H) 04/28/2019   BUN 13 04/28/2019   CREATININE 0.67 04/28/2019   BILITOT 0.4 04/28/2019   ALKPHOS 76 04/28/2019   AST 20 04/28/2019   ALT 29 04/28/2019   PROT 7.3 04/28/2019   ALBUMIN 4.6 04/28/2019   CALCIUM 9.8 04/28/2019   GFR 89.04 04/28/2019   Lab Results  Component Value Date   CHOL 194 04/28/2019   Lab Results  Component Value Date   HDL 50.80 04/28/2019   Lab Results  Component Value Date   LDLCALC 98 10/24/2018   Lab Results  Component Value Date   TRIG 219.0 (H) 04/28/2019   Lab Results  Component Value Date   CHOLHDL 4 04/28/2019   Lab Results  Component Value Date   HGBA1C 6.9 (H) 04/28/2019      Assessment & Plan:   Problem List Items Addressed This Visit    None    Visit  Diagnoses    Shoulder strain, unspecified laterality, initial encounter    -  Primary   Relevant Medications   ketorolac (TORADOL) injection 60 mg (Completed)   methocarbamol (ROBAXIN) 500 MG tablet   ketorolac (TORADOL) 10 MG tablet   Other Relevant Orders   Ambulatory referral to Sports Medicine      Meds ordered this encounter  Medications  . ketorolac (TORADOL) injection 60 mg  . methocarbamol (ROBAXIN) 500 MG tablet    Sig: Take 1 tablet (500 mg total) by mouth every 8 (eight) hours as needed for muscle spasms.    Dispense:  40 tablet    Refill:  0  . ketorolac (TORADOL) 10 MG tablet    Sig: Take 1 tablet (10 mg total) by mouth every 8 (eight) hours as needed.    Dispense:  20 tablet    Refill:  0    Follow-up: Return Continue exercises. Take robaxin every 8 hours for a few days and then as needed.Marland Kitchen

## 2019-07-08 ENCOUNTER — Ambulatory Visit: Payer: Federal, State, Local not specified - PPO

## 2019-07-08 DIAGNOSIS — G4733 Obstructive sleep apnea (adult) (pediatric): Secondary | ICD-10-CM | POA: Diagnosis not present

## 2019-07-08 DIAGNOSIS — R0683 Snoring: Secondary | ICD-10-CM

## 2019-07-08 DIAGNOSIS — G4719 Other hypersomnia: Secondary | ICD-10-CM

## 2019-07-09 ENCOUNTER — Telehealth: Payer: Self-pay | Admitting: Pulmonary Disease

## 2019-07-09 DIAGNOSIS — F330 Major depressive disorder, recurrent, mild: Secondary | ICD-10-CM | POA: Diagnosis not present

## 2019-07-09 DIAGNOSIS — G4733 Obstructive sleep apnea (adult) (pediatric): Secondary | ICD-10-CM | POA: Diagnosis not present

## 2019-07-09 DIAGNOSIS — F900 Attention-deficit hyperactivity disorder, predominantly inattentive type: Secondary | ICD-10-CM | POA: Diagnosis not present

## 2019-07-09 NOTE — Telephone Encounter (Signed)
HST 07/08/19 >> AHI 8.4, SpO2 low 79%.    Please inform her that her sleep study shows mild obstructive sleep apnea.  Please arrange for ROV with me or NP to discuss treatment options.

## 2019-07-09 NOTE — Telephone Encounter (Signed)
Attempted to call patient, her voicemail has not been set up so unable to leave message. Will call back another day.

## 2019-07-10 ENCOUNTER — Encounter: Payer: Self-pay | Admitting: Family Medicine

## 2019-07-10 ENCOUNTER — Ambulatory Visit (HOSPITAL_BASED_OUTPATIENT_CLINIC_OR_DEPARTMENT_OTHER)
Admission: RE | Admit: 2019-07-10 | Discharge: 2019-07-10 | Disposition: A | Discharge status: Home / Self Care | Payer: Federal, State, Local not specified - PPO | Source: Ambulatory Visit | Attending: Family Medicine | Admitting: Family Medicine

## 2019-07-10 ENCOUNTER — Other Ambulatory Visit: Payer: Self-pay

## 2019-07-10 ENCOUNTER — Ambulatory Visit: Payer: Federal, State, Local not specified - PPO | Admitting: Family Medicine

## 2019-07-10 ENCOUNTER — Ambulatory Visit: Payer: Self-pay

## 2019-07-10 VITALS — BP 134/83 | Ht 65.0 in | Wt 224.0 lb

## 2019-07-10 DIAGNOSIS — M25511 Pain in right shoulder: Secondary | ICD-10-CM | POA: Insufficient documentation

## 2019-07-10 DIAGNOSIS — M778 Other enthesopathies, not elsewhere classified: Secondary | ICD-10-CM | POA: Insufficient documentation

## 2019-07-10 DIAGNOSIS — G8929 Other chronic pain: Secondary | ICD-10-CM | POA: Insufficient documentation

## 2019-07-10 DIAGNOSIS — M25512 Pain in left shoulder: Secondary | ICD-10-CM | POA: Diagnosis not present

## 2019-07-10 MED ORDER — IBUPROFEN-FAMOTIDINE 800-26.6 MG PO TABS: 1.0000 | ORAL_TABLET | Freq: Three times a day (TID) | ORAL | 3 refills | Status: DC | PRN

## 2019-07-10 NOTE — Patient Instructions (Signed)
Nice to meet you Please try the exercises  Please try to avoid sitting at the computer for longer than 40 minutes  Please try the duexis as needed  Please try ice  I will call with there results from today  Please send me a message in [place] with any questions or updates.  Please see me back in 4 weeks.   --Dr. Raeford Razor

## 2019-07-10 NOTE — Telephone Encounter (Signed)
Called and spoke to patient. Gave results per Dr. Halford Chessman.  Scheduled patient for telephone ROV with NP to review and discuss treatment options. Offered patient a video visit but patient is reluctant to do so. Nothing further needed at this time.

## 2019-07-10 NOTE — Assessment & Plan Note (Signed)
Acute on chronic in nature.  Does have calcific changes of the supraspinatus on the left and right side.  Seems unlikely to be the source of the pain.  Does have possible gout contribution based on ultrasound. -Right and left shoulder x-ray. -Uric acid. -Duexis.  Sample provided. -Counseled on home exercise therapy and supportive care. -Can consider injections going forward.

## 2019-07-10 NOTE — Progress Notes (Signed)
Paula Kelly - 63 y.o. female MRN [id number]  Date of birth: [birth year]  SUBJECTIVE:  Including CC & ROS.  Chief Complaint  Patient presents with  . Shoulder Pain    bilateral shoulder    Paula Kelly is a 63 y.o. female that is presenting with acute on chronic bilateral shoulder pain.  Has gone to integrative medicines that usually seem to help.  That has been limited secondary to Covid.  The pain is occurring over each joint.  She notices some pain laterally as well.  Denies any specific inciting event.  She does work at a desk for long periods of time.  No formal physical therapy.  Has not taken anything for the pain.  No prior imaging.  Feels like the symptoms are worse at the end of the day as well as the end of the week.   Review of Systems  Constitutional: Negative for fever.  HENT: Negative for congestion.   Respiratory: Negative for cough.   Cardiovascular: Negative for chest pain.  Gastrointestinal: Negative for abdominal pain.  Musculoskeletal: Positive for arthralgias and back pain.  Skin: Negative for color change.  Neurological: Negative for weakness.  Hematological: Negative for adenopathy.    HISTORY: Past Medical, Surgical, Social, and Family History Reviewed & Updated per EMR.   Pertinent Historical Findings include:  Past Medical History:  Diagnosis Date  . Allergy    seasonal  . Arthritis   . Cataract   . Depression   . Diabetes mellitus without complication ([place])   . H/O fibromyalgia    had for over 10 years  . Hyperlipidemia   . Hypertension   . Post-operative nausea and vomiting     Past Surgical History:  Procedure Laterality Date  . APPENDECTOMY    . COLONOSCOPY    . kidney stone removal  2013   used a basket extraction    Allergies  Allergen Reactions  . Metronidazole Rash    Family History  Problem Relation Age of Onset  . Diabetes Mother   . Heart disease Father   . Emphysema Father   . CVA Sister   . AAA (abdominal  aortic aneurysm) Brother      Social History   Socioeconomic History  . Marital status: Married    Spouse name: Not on file  . Number of children: Not on file  . Years of education: Not on file  . Highest education level: Not on file  Occupational History  . Not on file  Social Needs  . Financial resource strain: Not on file  . Food insecurity    Worry: Not on file    Inability: Not on file  . Transportation needs    Medical: Not on file    Non-medical: Not on file  Tobacco Use  . Smoking status: Current Every Day Smoker    Packs/day: 1.00    Types: Cigarettes  . Smokeless tobacco: Never Used  Substance and Sexual Activity  . Alcohol use: No  . Drug use: No  . Sexual activity: Not on file  Lifestyle  . Physical activity    Days per week: Not on file    Minutes per session: Not on file  . Stress: Not on file  Relationships  . Social Herbalist on phone: Not on file    Gets together: Not on file    Attends religious service: Not on file    Active member of club or organization: Not on file  Attends meetings of clubs or organizations: Not on file    Relationship status: Not on file  . Intimate partner violence    Fear of current or ex partner: Not on file    Emotionally abused: Not on file    Physically abused: Not on file    Forced sexual activity: Not on file  Other Topics Concern  . Not on file  Social History Narrative  . Not on file     PHYSICAL EXAM:  VS: BP 134/83   Ht 5\' 5"  (1.651 m)   Wt 224 lb (101.6 kg)   BMI 37.28 kg/m  Physical Exam Gen: NAD, alert, cooperative with exam, well-appearing ENT: normal lips, normal nasal mucosa,  Eye: normal EOM, normal conjunctiva and lids CV:  no edema, +2 pedal pulses   Resp: no accessory muscle use, non-labored,   Skin: no rashes, no areas of induration  Neuro: normal tone, normal sensation to touch Psych:  normal insight, alert and oriented MSK:  Right and left shoulder: Inspection  reveals no abnormalities, atrophy or asymmetry. Palpation is normal with no tenderness over AC joint or bicipital groove. ROM is full in all planes. Rotator cuff strength normal throughout. Negative Hawkin's tests, empty can sign. Speeds tests normal. No labral pathology noted with negative Obrien's Normal scapular function observed. No painful arc and no drop arm sign. No apprehension sign NVI   Limited ultrasound: Right and left shoulder:  Right shoulder:  Normal BT in long and short axis Normal subscapularis  Calcific change of the supraspinatus at the insertion with no increased hyperemia. Hyperechoic debris appreciated in the posterior glenohumeral joint which could represent gout.  Left shoulder: Normal BT in long and short axis Normal subscapularis  Calcific change of the supraspinatus at the insertion with no increased hyperemia. Normal posterior glenohumeral joint  Summary: Findings possibly related to the calcific changes of the supraspinatus such as a calcific tendinitis.  Possible gout contribution  Ultrasound and interpretation by Clearance Coots, MD      ASSESSMENT & PLAN:   Chronic pain of both shoulders Acute on chronic in nature.  Does have calcific changes of the supraspinatus on the left and right side.  Seems unlikely to be the source of the pain.  Does have possible gout contribution based on ultrasound. -Right and left shoulder x-ray. -Uric acid. -Duexis.  Sample provided. -Counseled on home exercise therapy and supportive care. -Can consider injections going forward.

## 2019-07-11 LAB — URIC ACID: Uric Acid: 4.7 mg/dL (ref 2.5–7.1)

## 2019-07-13 ENCOUNTER — Telehealth: Payer: Self-pay | Admitting: Family Medicine

## 2019-07-13 NOTE — Telephone Encounter (Signed)
Spoke with patient about xray and lab results.   Rosemarie Ax, MD [facility] 07/13/2019, 8:23 AM

## 2019-07-14 ENCOUNTER — Encounter: Payer: Self-pay | Admitting: Primary Care

## 2019-07-14 ENCOUNTER — Ambulatory Visit (INDEPENDENT_AMBULATORY_CARE_PROVIDER_SITE_OTHER): Payer: Federal, State, Local not specified - PPO | Admitting: Primary Care

## 2019-07-14 ENCOUNTER — Other Ambulatory Visit: Payer: Self-pay

## 2019-07-14 DIAGNOSIS — G4733 Obstructive sleep apnea (adult) (pediatric): Secondary | ICD-10-CM | POA: Diagnosis not present

## 2019-07-14 NOTE — Addendum Note (Signed)
Addended by: Nena Polio on: 07/14/2019 10:20 AM   Modules accepted: Orders

## 2019-07-14 NOTE — Progress Notes (Signed)
Reviewed and agree with assessment/plan.   Rheana Casebolt, MD [facility] 08/15/2016, 12:24 PM Pager:  [phone]  

## 2019-07-14 NOTE — Patient Instructions (Signed)
Orders: New CPAP- auto titrate 5-15cm h20, mask of choice, supplies and humidification  Follow-up: 30-90 days with Dr. Halford Chessman or APP  Recommendations: Aim to wear CPAP every night for goal 4-6 hours or more Do not drive if experiencing excessive daytime fatigue or somnolence Do not take sedation medication or drink alcohol in excessive prior to bedtime as these can worsen sleep apnea   Sleep Apnea Sleep apnea is a condition in which breathing pauses or becomes shallow during sleep. Episodes of sleep apnea usually last 10 seconds or longer, and they may occur as many as 20 times an hour. Sleep apnea disrupts your sleep and keeps your body from getting the rest that it needs. This condition can increase your risk of certain health problems, including:  Heart attack.  Stroke.  Obesity.  Diabetes.  Heart failure.  Irregular heartbeat. What are the causes? There are three kinds of sleep apnea:  Obstructive sleep apnea. This kind is caused by a blocked or collapsed airway.  Central sleep apnea. This kind happens when the part of the brain that controls breathing does not send the correct signals to the muscles that control breathing.  Mixed sleep apnea. This is a combination of obstructive and central sleep apnea. The most common cause of this condition is a collapsed or blocked airway. An airway can collapse or become blocked if:  Your throat muscles are abnormally relaxed.  Your tongue and tonsils are larger than normal.  You are overweight.  Your airway is smaller than normal. What increases the risk? You are more likely to develop this condition if you:  Are overweight.  Smoke.  Have a smaller than normal airway.  Are elderly.  Are female.  Drink alcohol.  Take sedatives or tranquilizers.  Have a family history of sleep apnea. What are the signs or symptoms? Symptoms of this condition include:  Trouble staying asleep.  Daytime sleepiness and tiredness.   Irritability.  Loud snoring.  Morning headaches.  Trouble concentrating.  Forgetfulness.  Decreased interest in sex.  Unexplained sleepiness.  Mood swings.  Personality changes.  Feelings of depression.  Waking up often during the night to urinate.  Dry mouth.  Sore throat. How is this diagnosed? This condition may be diagnosed with:  A medical history.  A physical exam.  A series of tests that are done while you are sleeping (sleep study). These tests are usually done in a sleep lab, but they may also be done at home. How is this treated? Treatment for this condition aims to restore normal breathing and to ease symptoms during sleep. It may involve managing health issues that can affect breathing, such as high blood pressure or obesity. Treatment may include:  Sleeping on your side.  Using a decongestant if you have nasal congestion.  Avoiding the use of depressants, including alcohol, sedatives, and narcotics.  Losing weight if you are overweight.  Making changes to your diet.  Quitting smoking.  Using a device to open your airway while you sleep, such as: ? An oral appliance. This is a custom-made mouthpiece that shifts your lower jaw forward. ? A continuous positive airway pressure (CPAP) device. This device blows air through a mask when you breathe out (exhale). ? A nasal expiratory positive airway pressure (EPAP) device. This device has valves that you put into each nostril. ? A bi-level positive airway pressure (BPAP) device. This device blows air through a mask when you breathe in (inhale) and breathe out (exhale).  Having surgery if  other treatments do not work. During surgery, excess tissue is removed to create a wider airway. It is important to get treatment for sleep apnea. Without treatment, this condition can lead to:  High blood pressure.  Coronary artery disease.  In men, an inability to achieve or maintain an erection (impotence).   Reduced thinking abilities. Follow these instructions at home: Lifestyle  Make any lifestyle changes that your health care provider recommends.  Eat a healthy, well-balanced diet.  Take steps to lose weight if you are overweight.  Avoid using depressants, including alcohol, sedatives, and narcotics.  Do not use any products that contain nicotine or tobacco, such as cigarettes, e-cigarettes, and chewing tobacco. If you need help quitting, ask your health care provider. General instructions  Take over-the-counter and prescription medicines only as told by your health care provider.  If you were given a device to open your airway while you sleep, use it only as told by your health care provider.  If you are having surgery, make sure to tell your health care provider you have sleep apnea. You may need to bring your device with you.  Keep all follow-up visits as told by your health care provider. This is important. Contact a health care provider if:  The device that you received to open your airway during sleep is uncomfortable or does not seem to be working.  Your symptoms do not improve.  Your symptoms get worse. Get help right away if:  You develop: ? Chest pain. ? Shortness of breath. ? Discomfort in your back, arms, or stomach.  You have: ? Trouble speaking. ? Weakness on one side of your body. ? Drooping in your face. These symptoms may represent a serious problem that is an emergency. Do not wait to see if the symptoms will go away. Get medical help right away. Call your local emergency services (911 in the [place] Do not drive yourself to the hospital. Summary  Sleep apnea is a condition in which breathing pauses or becomes shallow during sleep.  The most common cause is a collapsed or blocked airway.  The goal of treatment is to restore normal breathing and to ease symptoms during sleep. This information is not intended to replace advice given to you by your health  care provider. Make sure you discuss any questions you have with your health care provider. Document Released: 07/27/2002 Document Revised: 05/23/2018 Document Reviewed: 04/01/2018   CPAP and BPAP Information CPAP and BPAP are methods of helping a person breathe with the use of air pressure. CPAP stands for "continuous positive airway pressure." BPAP stands for "bi-level positive airway pressure." In both methods, air is blown through your nose or mouth and into your air passages to help you breathe well. CPAP and BPAP use different amounts of pressure to blow air. With CPAP, the amount of pressure stays the same while you breathe in and out. With BPAP, the amount of pressure is increased when you breathe in (inhale) so that you can take larger breaths. Your health care provider will recommend whether CPAP or BPAP would be more helpful for you. Why are CPAP and BPAP treatments used? CPAP or BPAP can be helpful if you have:  Sleep apnea.  Chronic obstructive pulmonary disease (COPD).  Heart failure.  Medical conditions that weaken the muscles of the chest including muscular dystrophy, or neurological diseases such as amyotrophic lateral sclerosis (ALS).  Other problems that cause breathing to be weak, abnormal, or difficult. CPAP is most commonly used  for obstructive sleep apnea (OSA) to keep the airways from collapsing when the muscles relax during sleep. How is CPAP or BPAP administered? Both CPAP and BPAP are provided by a small machine with a flexible plastic tube that attaches to a plastic mask. You wear the mask. Air is blown through the mask into your nose or mouth. The amount of pressure that is used to blow the air can be adjusted on the machine. Your health care provider will determine the pressure setting that should be used based on your individual needs. When should CPAP or BPAP be used? In most cases, the mask only needs to be worn during sleep. Generally, the mask needs to be  worn throughout the night and during any daytime naps. People with certain medical conditions may also need to wear the mask at other times when they are awake. Follow instructions from your health care provider about when to use the machine. What are some tips for using the mask?   Because the mask needs to be snug, some people feel trapped or closed-in (claustrophobic) when first using the mask. If you feel this way, you may need to get used to the mask. One way to do this is by holding the mask loosely over your nose or mouth and then gradually applying the mask more snugly. You can also gradually increase the amount of time that you use the mask.  Masks are available in various types and sizes. Some fit over your mouth and nose while others fit over just your nose. If your mask does not fit well, talk with your health care provider about getting a different one.  If you are using a mask that fits over your nose and you tend to breathe through your mouth, a chin strap may be applied to help keep your mouth closed.  The CPAP and BPAP machines have alarms that may sound if the mask comes off or develops a leak.  If you have trouble with the mask, it is very important that you talk with your health care provider about finding a way to make the mask easier to tolerate. Do not stop using the mask. Stopping the use of the mask could have a negative impact on your health. What are some tips for using the machine?  Place your CPAP or BPAP machine on a secure table or stand near an electrical outlet.  Know where the on/off switch is located on the machine.  Follow instructions from your health care provider about how to set the pressure on your machine and when you should use it.  Do not eat or drink while the CPAP or BPAP machine is on. Food or fluids could get pushed into your lungs by the pressure of the CPAP or BPAP.  Do not smoke. Tobacco smoke residue can damage the machine.  For home use,  CPAP and BPAP machines can be rented or purchased through home health care companies. Many different brands of machines are available. Renting a machine before purchasing may help you find out which particular machine works well for you.  Keep the CPAP or BPAP machine and attachments clean. Ask your health care provider for specific instructions. Get help right away if:  You have redness or open areas around your nose or mouth where the mask fits.  You have trouble using the CPAP or BPAP machine.  You cannot tolerate wearing the CPAP or BPAP mask.  You have pain, discomfort, and bloating in your abdomen. Summary  CPAP and BPAP are methods of helping a person breathe with the use of air pressure.  Both CPAP and BPAP are provided by a small machine with a flexible plastic tube that attaches to a plastic mask.  If you have trouble with the mask, it is very important that you talk with your health care provider about finding a way to make the mask easier to tolerate. This information is not intended to replace advice given to you by your health care provider. Make sure you discuss any questions you have with your health care provider. Document Released: 05/04/2004 Document Revised: 11/26/2018 Document Reviewed: 06/25/2016 Elsevier Patient Education  2020 [employer] Patient Education  [employer].

## 2019-07-14 NOTE — Progress Notes (Signed)
Virtual Visit via Telephone Note  I connected with Katrinka Blazing on 07/14/19 at  9:00 AM EST by telephone and verified that I am speaking with the correct person using two identifiers.  Location: Patient: Home Provider: Home   I discussed the limitations, risks, security and privacy concerns of performing an evaluation and management service by telephone and the availability of in person appointments. I also discussed with the patient that there may be a patient responsible charge related to this service. The patient expressed understanding and agreed to proceed.   History of Present Illness: 63 year old female, current every day smoker. PMH significant for hypertension, type 2 diabetes, ADHD, vit D deficiency. Patient of Dr. Halford Chessman, seen for initial consult on 06/17/19 for snoring and excessive daytime fatigue. Epworth 18/24.   07/14/2019 Patient contacted today to review recent sleep test. HST on 07/08/19 showed mild obstructive sleep apnea with AHI 8.4, SpO2 low 79%. Discussed treatment options including weight loss, oral appliance, CPAP, ENT surgical eval. Patient would like to pursue CPAP therapy.    Observations/Objective:  - No shortness of breath, wheezing or cough noted during phone conversation  Assessment and Plan:  Obstructive sleep apnea: - Patient reports excessive daytime fatigue; Epworth 18 - HST 07/08/19 showed mild OSA with AHI 8.4 - Discussed treatment options, patient has elected to try CPAP therapy - Plan CPAP auto titrate 5-15cm h20, mask of choice, supplies and humidification - Patient to wear CPAP every night for goal 4-6 hours or more - Follow up in 30-90 days for compliance check    Follow Up Instructions:   - 30-90 days with Dr. Halford Chessman or Eustaquio Maize NP  I discussed the assessment and treatment plan with the patient. The patient was provided an opportunity to ask questions and all were answered. The patient agreed with the plan and demonstrated an  understanding of the instructions.   The patient was advised to call back or seek an in-person evaluation if the symptoms worsen or if the condition fails to improve as anticipated.  I provided 15 minutes of non-face-to-face time during this encounter.   Martyn Ehrich, NP

## 2019-07-15 DIAGNOSIS — H2513 Age-related nuclear cataract, bilateral: Secondary | ICD-10-CM | POA: Diagnosis not present

## 2019-07-15 DIAGNOSIS — H40033 Anatomical narrow angle, bilateral: Secondary | ICD-10-CM | POA: Diagnosis not present

## 2019-07-15 DIAGNOSIS — H43813 Vitreous degeneration, bilateral: Secondary | ICD-10-CM | POA: Diagnosis not present

## 2019-07-15 DIAGNOSIS — H04123 Dry eye syndrome of bilateral lacrimal glands: Secondary | ICD-10-CM | POA: Diagnosis not present

## 2019-07-22 ENCOUNTER — Other Ambulatory Visit: Payer: Self-pay

## 2019-07-22 ENCOUNTER — Telehealth: Payer: Self-pay | Admitting: Pulmonary Disease

## 2019-07-22 DIAGNOSIS — I10 Essential (primary) hypertension: Secondary | ICD-10-CM

## 2019-07-22 DIAGNOSIS — E119 Type 2 diabetes mellitus without complications: Secondary | ICD-10-CM

## 2019-07-22 MED ORDER — CANDESARTAN CILEXETIL 32 MG PO TABS: 32.0000 mg | ORAL_TABLET | Freq: Every day | ORAL | 1 refills | Status: DC

## 2019-07-22 NOTE — Telephone Encounter (Signed)
Entered in error

## 2019-07-28 ENCOUNTER — Other Ambulatory Visit: Payer: Self-pay | Admitting: Family Medicine

## 2019-07-28 DIAGNOSIS — I10 Essential (primary) hypertension: Secondary | ICD-10-CM

## 2019-07-28 MED ORDER — HYDROCHLOROTHIAZIDE 25 MG PO TABS: 25.0000 mg | ORAL_TABLET | Freq: Every day | ORAL | 1 refills | Status: DC

## 2019-07-31 DIAGNOSIS — G4733 Obstructive sleep apnea (adult) (pediatric): Secondary | ICD-10-CM | POA: Diagnosis not present

## 2019-08-05 ENCOUNTER — Ambulatory Visit: Payer: Self-pay

## 2019-08-05 ENCOUNTER — Encounter: Payer: Self-pay | Admitting: Family Medicine

## 2019-08-05 ENCOUNTER — Other Ambulatory Visit: Payer: Self-pay

## 2019-08-05 ENCOUNTER — Ambulatory Visit: Payer: Federal, State, Local not specified - PPO | Admitting: Family Medicine

## 2019-08-05 VITALS — BP 156/84 | Ht 64.0 in | Wt 220.0 lb

## 2019-08-05 DIAGNOSIS — S76312A Strain of muscle, fascia and tendon of the posterior muscle group at thigh level, left thigh, initial encounter: Secondary | ICD-10-CM

## 2019-08-05 DIAGNOSIS — M25511 Pain in right shoulder: Secondary | ICD-10-CM

## 2019-08-05 DIAGNOSIS — M25512 Pain in left shoulder: Secondary | ICD-10-CM

## 2019-08-05 DIAGNOSIS — G8929 Other chronic pain: Secondary | ICD-10-CM | POA: Diagnosis not present

## 2019-08-05 NOTE — Assessment & Plan Note (Signed)
Occurring at the origin.  Happened after trying to get out of her car.  Seems to irritated the biceps femoris.  -Counseled on home exercise therapy and supportive care. -Counseled on compression and offloading the area. -Could consider injection or physical therapy.

## 2019-08-05 NOTE — Progress Notes (Signed)
Paula Kelly - 63 y.o. female MRN [id number]  Date of birth: 04/25/1956  SUBJECTIVE:  Including CC & ROS.  Chief Complaint  Patient presents with  . Follow-up    follow up for bilateral shoulder  . Leg Pain    left leg    Paula Kelly is a 63 y.o. female that is following up for bilateral shoulder pain and presenting with acute hamstring pain.  The shoulder pain has improved with the exercises as well as the Duexis.  She has had significant improvement of the pain in her range of motion.  A few weeks ago she was getting out of her car and felt a strain in the posterior aspect of the hamstring.  She feels it near the origin.  The pain is been acutely occurring.  It can be moderate to severe.  It is sharp and stabbing.  It is worse when she sits.  Is also painful with going up and down stairs.  She denies any ecchymosis or swelling.  No history of similar symptoms.   Review of Systems  Constitutional: Negative for fever.  HENT: Negative for congestion.   Respiratory: Negative for cough.   Cardiovascular: Negative for chest pain.  Gastrointestinal: Negative for abdominal pain.  Musculoskeletal: Positive for myalgias.  Skin: Negative for color change.  Neurological: Negative for weakness.  Hematological: Negative for adenopathy.    HISTORY: Past Medical, Surgical, Social, and Family History Reviewed & Updated per EMR.   Pertinent Historical Findings include:  Past Medical History:  Diagnosis Date  . Allergy    seasonal  . Arthritis   . Cataract   . Depression   . Diabetes mellitus without complication ([place])   . H/O fibromyalgia    had for over 10 years  . Hyperlipidemia   . Hypertension   . Post-operative nausea and vomiting     Past Surgical History:  Procedure Laterality Date  . APPENDECTOMY    . COLONOSCOPY    . kidney stone removal  2013   used a basket extraction    Allergies  Allergen Reactions  . Metronidazole Rash    Family History  Problem  Relation Age of Onset  . Diabetes Mother   . Heart disease Father   . Emphysema Father   . CVA Sister   . AAA (abdominal aortic aneurysm) Brother      Social History   Socioeconomic History  . Marital status: Married    Spouse name: Not on file  . Number of children: Not on file  . Years of education: Not on file  . Highest education level: Not on file  Occupational History  . Not on file  Tobacco Use  . Smoking status: Current Every Day Smoker    Packs/day: 1.00    Types: Cigarettes  . Smokeless tobacco: Never Used  Substance and Sexual Activity  . Alcohol use: No  . Drug use: No  . Sexual activity: Not on file  Other Topics Concern  . Not on file  Social History Narrative  . Not on file   Social Determinants of Health   Financial Resource Strain:   . Difficulty of Paying Living Expenses: Not on file  Food Insecurity:   . Worried About Charity fundraiser in the Last Year: Not on file  . Ran Out of Food in the Last Year: Not on file  Transportation Needs:   . Lack of Transportation (Medical): Not on file  . Lack of Transportation (Non-Medical): Not  on file  Physical Activity:   . Days of Exercise per Week: Not on file  . Minutes of Exercise per Session: Not on file  Stress:   . Feeling of Stress : Not on file  Social Connections:   . Frequency of Communication with Friends and Family: Not on file  . Frequency of Social Gatherings with Friends and Family: Not on file  . Attends Religious Services: Not on file  . Active Member of Clubs or Organizations: Not on file  . Attends Archivist Meetings: Not on file  . Marital Status: Not on file  Intimate Partner Violence:   . Fear of Current or Ex-Partner: Not on file  . Emotionally Abused: Not on file  . Physically Abused: Not on file  . Sexually Abused: Not on file     PHYSICAL EXAM:  VS: BP (!) 156/84   Ht 5\' 4"  (1.626 m)   Wt 220 lb (99.8 kg)   BMI 37.76 kg/m  Physical Exam Gen: NAD, alert,  cooperative with exam, well-appearing ENT: normal lips, normal nasal mucosa,  Eye: normal EOM, normal conjunctiva and lids CV:  no edema, +2 pedal pulses   Resp: no accessory muscle use, non-labored,   Skin: no rashes, no areas of induration  Neuro: normal tone, normal sensation to touch Psych:  normal insight, alert and oriented MSK:  Right and left shoulder. Normal active flexion abduction. Normal external rotation. Normal strength resistance. Left hamstring: No ecchymosis or swelling. Tenderness to palpation at the origin. Pain with resistance to knee flexion. Neurovascularly intact  Limited ultrasound: Left hamstring:  There appears to be swelling at the origin of the hamstring.  No separation of the hamstring tendon. No changes in the mid belly or musculature of the hamstring down the posterior aspect of the leg. No avulsion appreciated at the ischial tuberosity.  Summary: Findings suggestive of acute hamstring tendinitis at the origin.  Ultrasound and interpretation by Clearance Coots, MD    ASSESSMENT & PLAN:   Strain of left hamstring Occurring at the origin.  Happened after trying to get out of her car.  Seems to irritated the biceps femoris.  -Counseled on home exercise therapy and supportive care. -Counseled on compression and offloading the area. -Could consider injection or physical therapy.  Chronic pain of both shoulders Has had significant improvement with the Duexis.  Has good range of motion. -Counseled on the Duexis and its use. -Counseled on home exercise therapy and supportive care. -Could consider injections if necessary.

## 2019-08-05 NOTE — Assessment & Plan Note (Signed)
Has had significant improvement with the Duexis.  Has good range of motion. -Counseled on the Duexis and its use. -Counseled on home exercise therapy and supportive care. -Could consider injections if necessary.

## 2019-08-05 NOTE — Patient Instructions (Signed)
Good to see you Please try ice or heat on the area  Please try the exercises  Please try a pillow to take the pressure off the area  Please try a roller or a lacrosse ball to massage the area Please send me a message in MyChart with any questions or updates.  Please see me back in 4-6 weeks.   --Dr. Raeford Razor

## 2019-08-07 ENCOUNTER — Ambulatory Visit: Payer: Federal, State, Local not specified - PPO | Admitting: Family Medicine

## 2019-08-31 DIAGNOSIS — G4733 Obstructive sleep apnea (adult) (pediatric): Secondary | ICD-10-CM | POA: Diagnosis not present

## 2019-09-09 ENCOUNTER — Ambulatory Visit: Payer: Federal, State, Local not specified - PPO | Admitting: Family Medicine

## 2019-09-11 ENCOUNTER — Ambulatory Visit: Payer: Federal, State, Local not specified - PPO | Admitting: Family Medicine

## 2019-09-11 ENCOUNTER — Other Ambulatory Visit: Payer: Self-pay

## 2019-09-11 ENCOUNTER — Encounter: Payer: Self-pay | Admitting: Family Medicine

## 2019-09-11 DIAGNOSIS — S76312D Strain of muscle, fascia and tendon of the posterior muscle group at thigh level, left thigh, subsequent encounter: Secondary | ICD-10-CM | POA: Diagnosis not present

## 2019-09-11 DIAGNOSIS — G8929 Other chronic pain: Secondary | ICD-10-CM

## 2019-09-11 DIAGNOSIS — M25512 Pain in left shoulder: Secondary | ICD-10-CM | POA: Diagnosis not present

## 2019-09-11 DIAGNOSIS — M25511 Pain in right shoulder: Secondary | ICD-10-CM

## 2019-09-11 MED ORDER — NITROGLYCERIN 0.2 MG/HR TD PT24: MEDICATED_PATCH | TRANSDERMAL | 11 refills | Status: DC

## 2019-09-11 NOTE — Assessment & Plan Note (Signed)
Her pain is well controlled when she takes the [place] on home exercise therapy and supportive care. -Counseled on Duexis. -Could consider injections or physical therapy.

## 2019-09-11 NOTE — Patient Instructions (Signed)
Good to see you Please continue the exercises  Please try the nitro and let me know if you don't tolerate it.   Please send me a message in MyChart with any questions or updates.  Please see me back in 6-8 weeks.   --Dr. Raeford Razor  Nitroglycerin Protocol   Apply 1/4 nitroglycerin patch to affected area daily.  Change position of patch within the affected area every 24 hours.  You may experience a headache during the first 1-2 weeks of using the patch, these should subside.  If you experience headaches after beginning nitroglycerin patch treatment, you may take your preferred over the counter pain reliever.  Another side effect of the nitroglycerin patch is skin irritation or rash related to patch adhesive.  Please notify our office if you develop more severe headaches or rash, and stop the patch.  Tendon healing with nitroglycerin patch may require 12 to 24 weeks depending on the extent of injury.  Men should not use if taking Viagra, Cialis, or Levitra.   Do not use if you have migraines or rosacea.

## 2019-09-11 NOTE — Assessment & Plan Note (Signed)
Pain has improved from previous.  She still notices it with certain movements and sitting. -Initiate nitro patches. -Counseled on home exercise therapy and supportive care. -Could consider imaging or physical therapy.

## 2019-09-11 NOTE — Progress Notes (Signed)
Paula Kelly - 64 y.o. female MRN [id number]  Date of birth: 1955/12/25  SUBJECTIVE:  Including CC & ROS.  Chief Complaint  Patient presents with  . Follow-up    follow up for left hamstring    Paula Kelly is a 64 y.o. female that is following up for her bilateral shoulder pain and her ongoing left hamstring pain.  She gets improvement when she takes a Duexis.  The hamstring has improved since previous.  She still has some pain when she steps up with the left foot.  She also experiences pain when she sits for prolonged periods of time.  Seems to be localized to that area   Review of Systems See HPI   HISTORY: Past Medical, Surgical, Social, and Family History Reviewed & Updated per EMR.   Pertinent Historical Findings include:  Past Medical History:  Diagnosis Date  . Allergy    seasonal  . Arthritis   . Cataract   . Depression   . Diabetes mellitus without complication ([place])   . H/O fibromyalgia    had for over 10 years  . Hyperlipidemia   . Hypertension   . Post-operative nausea and vomiting     Past Surgical History:  Procedure Laterality Date  . APPENDECTOMY    . COLONOSCOPY    . kidney stone removal  2013   used a basket extraction    Allergies  Allergen Reactions  . Metronidazole Rash    Family History  Problem Relation Age of Onset  . Diabetes Mother   . Heart disease Father   . Emphysema Father   . CVA Sister   . AAA (abdominal aortic aneurysm) Brother      Social History   Socioeconomic History  . Marital status: Married    Spouse name: Not on file  . Number of children: Not on file  . Years of education: Not on file  . Highest education level: Not on file  Occupational History  . Not on file  Tobacco Use  . Smoking status: Current Every Day Smoker    Packs/day: 1.00    Types: Cigarettes  . Smokeless tobacco: Never Used  Substance and Sexual Activity  . Alcohol use: No  . Drug use: No  . Sexual activity: Not on file    Other Topics Concern  . Not on file  Social History Narrative  . Not on file   Social Determinants of Health   Financial Resource Strain:   . Difficulty of Paying Living Expenses: Not on file  Food Insecurity:   . Worried About Charity fundraiser in the Last Year: Not on file  . Ran Out of Food in the Last Year: Not on file  Transportation Needs:   . Lack of Transportation (Medical): Not on file  . Lack of Transportation (Non-Medical): Not on file  Physical Activity:   . Days of Exercise per Week: Not on file  . Minutes of Exercise per Session: Not on file  Stress:   . Feeling of Stress : Not on file  Social Connections:   . Frequency of Communication with Friends and Family: Not on file  . Frequency of Social Gatherings with Friends and Family: Not on file  . Attends Religious Services: Not on file  . Active Member of Clubs or Organizations: Not on file  . Attends Archivist Meetings: Not on file  . Marital Status: Not on file  Intimate Partner Violence:   . Fear of Current  or Ex-Partner: Not on file  . Emotionally Abused: Not on file  . Physically Abused: Not on file  . Sexually Abused: Not on file     PHYSICAL EXAM:  VS: BP (!) 142/77   Ht 5\' 5"  (1.651 m)   Wt 220 lb (99.8 kg)   BMI 36.61 kg/m  Physical Exam Gen: NAD, alert, cooperative with exam, well-appearing ENT: normal lips, normal nasal mucosa,  Eye: normal EOM, normal conjunctiva and lids Skin: no rashes, no areas of induration  Neuro: normal tone, normal sensation to touch Psych:  normal insight, alert and oriented MSK:  Left hip: Mild tenderness at the origin of the hamstring. Good strength to resistance. Negative straight leg raise. Neurovascularly intact     ASSESSMENT & PLAN:   Chronic pain of both shoulders Her pain is well controlled when she takes the [place] on home exercise therapy and supportive care. -Counseled on Duexis. -Could consider injections or  physical therapy.  Strain of left hamstring Pain has improved from previous.  She still notices it with certain movements and sitting. -Initiate nitro patches. -Counseled on home exercise therapy and supportive care. -Could consider imaging or physical therapy.

## 2019-09-16 ENCOUNTER — Other Ambulatory Visit: Payer: Self-pay | Admitting: Family Medicine

## 2019-09-16 DIAGNOSIS — E7800 Pure hypercholesterolemia, unspecified: Secondary | ICD-10-CM

## 2019-10-01 DIAGNOSIS — G4733 Obstructive sleep apnea (adult) (pediatric): Secondary | ICD-10-CM | POA: Diagnosis not present

## 2019-10-06 DIAGNOSIS — Z79899 Other long term (current) drug therapy: Secondary | ICD-10-CM | POA: Diagnosis not present

## 2019-10-06 DIAGNOSIS — F909 Attention-deficit hyperactivity disorder, unspecified type: Secondary | ICD-10-CM | POA: Diagnosis not present

## 2019-10-06 DIAGNOSIS — F329 Major depressive disorder, single episode, unspecified: Secondary | ICD-10-CM | POA: Diagnosis not present

## 2019-10-15 DIAGNOSIS — N3941 Urge incontinence: Secondary | ICD-10-CM | POA: Diagnosis not present

## 2019-10-15 DIAGNOSIS — Z01411 Encounter for gynecological examination (general) (routine) with abnormal findings: Secondary | ICD-10-CM | POA: Diagnosis not present

## 2019-10-16 ENCOUNTER — Other Ambulatory Visit: Payer: Self-pay | Admitting: Urology

## 2019-10-16 DIAGNOSIS — Z1231 Encounter for screening mammogram for malignant neoplasm of breast: Secondary | ICD-10-CM

## 2019-10-20 ENCOUNTER — Other Ambulatory Visit: Payer: Self-pay | Admitting: Family Medicine

## 2019-10-20 DIAGNOSIS — E119 Type 2 diabetes mellitus without complications: Secondary | ICD-10-CM

## 2019-10-26 ENCOUNTER — Ambulatory Visit (INDEPENDENT_AMBULATORY_CARE_PROVIDER_SITE_OTHER): Payer: Federal, State, Local not specified - PPO | Admitting: Family Medicine

## 2019-10-26 ENCOUNTER — Other Ambulatory Visit: Payer: Self-pay

## 2019-10-26 ENCOUNTER — Encounter: Payer: Self-pay | Admitting: Family Medicine

## 2019-10-26 VITALS — BP 138/68 | HR 104 | Temp 97.5°F | Ht 65.0 in | Wt 228.0 lb

## 2019-10-26 DIAGNOSIS — E559 Vitamin D deficiency, unspecified: Secondary | ICD-10-CM | POA: Diagnosis not present

## 2019-10-26 DIAGNOSIS — I10 Essential (primary) hypertension: Secondary | ICD-10-CM

## 2019-10-26 DIAGNOSIS — Z720 Tobacco use: Secondary | ICD-10-CM | POA: Diagnosis not present

## 2019-10-26 DIAGNOSIS — J449 Chronic obstructive pulmonary disease, unspecified: Secondary | ICD-10-CM

## 2019-10-26 DIAGNOSIS — E7800 Pure hypercholesterolemia, unspecified: Secondary | ICD-10-CM

## 2019-10-26 DIAGNOSIS — E119 Type 2 diabetes mellitus without complications: Secondary | ICD-10-CM | POA: Diagnosis not present

## 2019-10-26 LAB — COMPREHENSIVE METABOLIC PANEL
ALT: 21 U/L (ref 0–35)
AST: 17 U/L (ref 0–37)
Albumin: 4.3 g/dL (ref 3.5–5.2)
Alkaline Phosphatase: 81 U/L (ref 39–117)
BUN: 9 mg/dL (ref 6–23)
CO2: 29 mEq/L (ref 19–32)
Calcium: 9.3 mg/dL (ref 8.4–10.5)
Chloride: 102 mEq/L (ref 96–112)
Creatinine, Ser: 0.74 mg/dL (ref 0.40–1.20)
GFR: 79.26 mL/min (ref >60.00)
Glucose, Bld: 118 mg/dL — ABNORMAL HIGH (ref 70–99)
Potassium: 4.2 mEq/L (ref 3.5–5.1)
Sodium: 138 mEq/L (ref 135–145)
Total Bilirubin: 0.4 mg/dL (ref 0.2–1.2)
Total Protein: 6.5 g/dL (ref 6.0–8.3)

## 2019-10-26 LAB — CBC
HCT: 39.7 % (ref 36.0–46.0)
Hemoglobin: 13.5 g/dL (ref 12.0–15.0)
MCHC: 33.9 g/dL (ref 30.0–36.0)
MCV: 90.1 fl (ref 78.0–100.0)
Platelets: 348 10*3/uL (ref 150.0–400.0)
RBC: 4.4 Mil/uL (ref 3.87–5.11)
RDW: 13.2 % (ref 11.5–15.5)
WBC: 7.1 10*3/uL (ref 4.0–10.5)

## 2019-10-26 LAB — LIPID PANEL
Cholesterol: 183 mg/dL (ref 0–200)
HDL: 44.7 mg/dL (ref >39.00)
NonHDL: 137.95
Total CHOL/HDL Ratio: 4
Triglycerides: 217 mg/dL — ABNORMAL HIGH (ref 0.0–149.0)
VLDL: 43.4 mg/dL — ABNORMAL HIGH (ref 0.0–40.0)

## 2019-10-26 LAB — URINALYSIS, ROUTINE W REFLEX MICROSCOPIC
Bilirubin Urine: NEGATIVE
Hgb urine dipstick: NEGATIVE
Ketones, ur: NEGATIVE
Leukocytes,Ua: NEGATIVE
Nitrite: NEGATIVE
Specific Gravity, Urine: 1.02 (ref 1.000–1.030)
Total Protein, Urine: NEGATIVE
Urine Glucose: NEGATIVE
Urobilinogen, UA: 0.2 (ref 0.0–1.0)
pH: 6 (ref 5.0–8.0)

## 2019-10-26 LAB — VITAMIN D 25 HYDROXY (VIT D DEFICIENCY, FRACTURES): VITD: 47.38 ng/mL (ref 30.00–100.00)

## 2019-10-26 LAB — LDL CHOLESTEROL, DIRECT: Direct LDL: 104 mg/dL

## 2019-10-26 LAB — HEMOGLOBIN A1C: Hgb A1c MFr Bld: 6.7 % — ABNORMAL HIGH (ref 4.6–6.5)

## 2019-10-26 NOTE — Patient Instructions (Signed)
Coping with Quitting Smoking  Quitting smoking is a physical and mental challenge. You will face cravings, withdrawal symptoms, and temptation. Before quitting, work with your health care provider to make a plan that can help you cope. Preparation can help you quit and keep you from giving in. How can I cope with cravings? Cravings usually last for 5-10 minutes. If you get through it, the craving will pass. Consider taking the following actions to help you cope with cravings:  Keep your mouth busy: ? Chew sugar-free gum. ? Suck on hard candies or a straw. ? Brush your teeth.  Keep your hands and body busy: ? Immediately change to a different activity when you feel a craving. ? Squeeze or play with a ball. ? Do an activity or a hobby, like making bead jewelry, practicing needlepoint, or working with wood. ? Mix up your normal routine. ? Take a short exercise break. Go for a quick walk or run up and down stairs. ? Spend time in public places where smoking is not allowed.  Focus on doing something kind or helpful for someone else.  Call a friend or family member to talk during a craving.  Join a support group.  Call a quit line, such as 1-800-QUIT-NOW.  Talk with your health care provider about medicines that might help you cope with cravings and make quitting easier for you. How can I deal with withdrawal symptoms? Your body may experience negative effects as it tries to get used to not having nicotine in the system. These effects are called withdrawal symptoms. They may include:  Feeling hungrier than normal.  Trouble concentrating.  Irritability.  Trouble sleeping.  Feeling depressed.  Restlessness and agitation.  Craving a cigarette. To manage withdrawal symptoms:  Avoid places, people, and activities that trigger your cravings.  Remember why you want to quit.  Get plenty of sleep.  Avoid coffee and other caffeinated drinks. These may worsen some of your  symptoms. How can I handle social situations? Social situations can be difficult when you are quitting smoking, especially in the first few weeks. To manage this, you can:  Avoid parties, bars, and other social situations where people might be smoking.  Avoid alcohol.  Leave right away if you have the urge to smoke.  Explain to your family and friends that you are quitting smoking. Ask for understanding and support.  Plan activities with friends or family where smoking is not an option. What are some ways I can cope with stress? Wanting to smoke may cause stress, and stress can make you want to smoke. Find ways to manage your stress. Relaxation techniques can help. For example:  Breathe slowly and deeply, in through your nose and out through your mouth.  Listen to soothing, relaxing music.  Talk with a family member or friend about your stress.  Light a candle.  Soak in a bath or take a shower.  Think about a peaceful place. What are some ways I can prevent weight gain? Be aware that many people gain weight after they quit smoking. However, not everyone does. To keep from gaining weight, have a plan in place before you quit and stick to the plan after you quit. Your plan should include:  Having healthy snacks. When you have a craving, it may help to: ? Eat plain popcorn, crunchy carrots, celery, or other cut vegetables. ? Chew sugar-free gum.  Changing how you eat: ? Eat small portion sizes at meals. ? Eat 4-6 small meals   throughout the day instead of 1-2 large meals a day. ? Be mindful when you eat. Do not watch television or do other things that might distract you as you eat.  Exercising regularly: ? Make time to exercise each day. If you do not have time for a long workout, do short bouts of exercise for 5-10 minutes several times a day. ? Do some form of strengthening exercise, like weight lifting, and some form of aerobic exercise, like running or swimming.  Drinking  plenty of water or other low-calorie or no-calorie drinks. Drink 6-8 glasses of water daily, or as much as instructed by your health care provider. Summary  Quitting smoking is a physical and mental challenge. You will face cravings, withdrawal symptoms, and temptation to smoke again. Preparation can help you as you go through these challenges.  You can cope with cravings by keeping your mouth busy (such as by chewing gum), keeping your body and hands busy, and making calls to family, friends, or a helpline for people who want to quit smoking.  You can cope with withdrawal symptoms by avoiding places where people smoke, avoiding drinks with caffeine, and getting plenty of rest.  Ask your health care provider about the different ways to prevent weight gain, avoid stress, and handle social situations. This information is not intended to replace advice given to you by your health care provider. Make sure you discuss any questions you have with your health care provider. Document Revised: 07/19/2017 Document Reviewed: 08/03/2016 Elsevier Patient Education  2020 [employer].  Exercising to Lose Weight Exercise is structured, repetitive physical activity to improve fitness and health. Getting regular exercise is important for everyone. It is especially important if you are overweight. Being overweight increases your risk of heart disease, stroke, diabetes, high blood pressure, and several types of cancer. Reducing your calorie intake and exercising can help you lose weight. Exercise is usually categorized as moderate or vigorous intensity. To lose weight, most people need to do a certain amount of moderate-intensity or vigorous-intensity exercise each week. Moderate-intensity exercise  Moderate-intensity exercise is any activity that gets you moving enough to burn at least three times more energy (calories) than if you were sitting. Examples of moderate exercise include:  Walking a mile in 15  minutes.  Doing light yard work.  Biking at an easy pace. Most people should get at least 150 minutes (2 hours and 30 minutes) a week of moderate-intensity exercise to maintain their body weight. Vigorous-intensity exercise Vigorous-intensity exercise is any activity that gets you moving enough to burn at least six times more calories than if you were sitting. When you exercise at this intensity, you should be working hard enough that you are not able to carry on a conversation. Examples of vigorous exercise include:  Running.  Playing a team sport, such as football, basketball, and soccer.  Jumping rope. Most people should get at least 75 minutes (1 hour and 15 minutes) a week of vigorous-intensity exercise to maintain their body weight. How can exercise affect me? When you exercise enough to burn more calories than you eat, you lose weight. Exercise also reduces body fat and builds muscle. The more muscle you have, the more calories you burn. Exercise also:  Improves mood.  Reduces stress and tension.  Improves your overall fitness, flexibility, and endurance.  Increases bone strength. The amount of exercise you need to lose weight depends on:  Your age.  The type of exercise.  Any health conditions you have.  Your overall physical ability. Talk to your health care provider about how much exercise you need and what types of activities are safe for you. What actions can I take to lose weight? Nutrition   Make changes to your diet as told by your health care provider or diet and nutrition specialist (dietitian). This may include: ? Eating fewer calories. ? Eating more protein. ? Eating less unhealthy fats. ? Eating a diet that includes fresh fruits and vegetables, whole grains, low-fat dairy products, and lean protein. ? Avoiding foods with added fat, salt, and sugar.  Drink plenty of water while you exercise to prevent dehydration or heat stroke. Activity  Choose an  activity that you enjoy and set realistic goals. Your health care provider can help you make an exercise plan that works for you.  Exercise at a moderate or vigorous intensity most days of the week. ? The intensity of exercise may vary from person to person. You can tell how intense a workout is for you by paying attention to your breathing and heartbeat. Most people will notice their breathing and heartbeat get faster with more intense exercise.  Do resistance training twice each week, such as: ? Push-ups. ? Sit-ups. ? Lifting weights. ? Using resistance bands.  Getting short amounts of exercise can be just as helpful as long structured periods of exercise. If you have trouble finding time to exercise, try to include exercise in your daily routine. ? Get up, stretch, and walk around every 30 minutes throughout the day. ? Go for a walk during your lunch break. ? Park your car farther away from your destination. ? If you take public transportation, get off one stop early and walk the rest of the way. ? Make phone calls while standing up and walking around. ? Take the stairs instead of elevators or escalators.  Wear comfortable clothes and shoes with good support.  Do not exercise so much that you hurt yourself, feel dizzy, or get very short of breath. Where to find more information  [place] Department of Health and Human Services: [employer]at  Centers for Disease Control and Prevention (CDC): http://www.wolf.info/ Contact a health care provider:  Before starting a new exercise program.  If you have questions or concerns about your weight.  If you have a medical problem that keeps you from exercising. Get help right away if you have any of the following while exercising:  Injury.  Dizziness.  Difficulty breathing or shortness of breath that does not go away when you stop exercising.  Chest pain.  Rapid heartbeat. Summary  Being overweight increases your risk of heart disease, stroke,  diabetes, high blood pressure, and several types of cancer.  Losing weight happens when you burn more calories than you eat.  Reducing the amount of calories you eat in addition to getting regular moderate or vigorous exercise each week helps you lose weight. This information is not intended to replace advice given to you by your health care provider. Make sure you discuss any questions you have with your health care provider. Document Revised: 08/19/2017 Document Reviewed: 08/19/2017 Elsevier Patient Education  2020 [employer].  Mindfulness-Based Stress Reduction Mindfulness-based stress reduction (MBSR) is a program that helps people learn to practice mindfulness. Mindfulness is the practice of intentionally paying attention to the present moment. It can be learned and practiced through techniques such as education, breathing exercises, meditation, and yoga. MBSR includes several mindfulness techniques in one program. MBSR works best when you understand the treatment, are  willing to try new things, and can commit to spending time practicing what you learn. MBSR training may include learning about:  How your emotions, thoughts, and reactions affect your body.  New ways to respond to things that cause negative thoughts to start (triggers).  How to notice your thoughts and let go of them.  Practicing awareness of everyday things that you normally do without thinking.  The techniques and goals of different types of meditation. What are the benefits of MBSR? MBSR can have many benefits, which include helping you to:  Develop self-awareness. This refers to knowing and understanding yourself.  Learn skills and attitudes that help you to participate in your own health care.  Learn new ways to care for yourself.  Be more accepting about how things are, and let things go.  Be less judgmental and approach things with an open mind.  Be patient with yourself and trust yourself more. MBSR  has also been shown to:  Reduce negative emotions, such as depression and anxiety.  Improve memory and focus.  Change how you sense and approach pain.  Boost your body's ability to fight infections.  Help you connect better with other people.  Improve your sense of well-being. Follow these instructions at home:   Find a local in-person or online MBSR program.  Set aside some time regularly for mindfulness practice.  Find a mindfulness practice that works best for you. This may include one or more of the following: ? Meditation. Meditation involves focusing your mind on a certain thought or activity. ? Breathing awareness exercises. These help you to stay present by focusing on your breath. ? Body scan. For this practice, you lie down and pay attention to each part of your body from head to toe. You can identify tension and soreness and intentionally relax parts of your body. ? Yoga. Yoga involves stretching and breathing, and it can improve your ability to move and be flexible. It can also provide an experience of testing your body's limits, which can help you release stress. ? Mindful eating. This way of eating involves focusing on the taste, texture, color, and smell of each bite of food. Because this slows down eating and helps you feel full sooner, it can be an important part of a weight-loss plan.  Find a podcast or recording that provides guidance for breathing awareness, body scan, or meditation exercises. You can listen to these any time when you have a free moment to rest without distractions.  Follow your treatment plan as told by your health care provider. This may include taking regular medicines and making changes to your diet or lifestyle as recommended. How to practice mindfulness To do a basic awareness exercise:  Find a comfortable place to sit.  Pay attention to the present moment. Observe your thoughts, feelings, and surroundings just as they are.  Avoid  placing judgment on yourself, your feelings, or your surroundings. Make note of any judgment that comes up, and let it go.  Your mind may wander, and that is okay. Make note of when your thoughts drift, and return your attention to the present moment. To do basic mindfulness meditation:  Find a comfortable place to sit. This may include a stable chair or a firm floor cushion. ? Sit upright with your back straight. Let your arms fall next to your side with your hands resting on your legs. ? If sitting in a chair, rest your feet flat on the floor. ? If sitting on a  cushion, cross your legs in front of you.  Keep your head in a neutral position with your chin dropped slightly. Relax your jaw and rest the tip of your tongue on the roof of your mouth. Drop your gaze to the floor. You can close your eyes if you like.  Breathe normally and pay attention to your breath. Feel the air moving in and out of your nose. Feel your belly expanding and relaxing with each breath.  Your mind may wander, and that is okay. Make note of when your thoughts drift, and return your attention to your breath.  Avoid placing judgment on yourself, your feelings, or your surroundings. Make note of any judgment or feelings that come up, let them go, and bring your attention back to your breath.  When you are ready, lift your gaze or open your eyes. Pay attention to how your body feels after the meditation. Where to find more information You can find more information about MBSR from:  Your health care provider.  Community-based meditation centers or programs.  Programs offered near you. Summary  Mindfulness-based stress reduction (MBSR) is a program that teaches you how to intentionally pay attention to the present moment. It is used with other treatments to help you cope better with daily stress, emotions, and pain.  MBSR focuses on developing self-awareness, which allows you to respond to life stress without  judgment or negative emotions.  MBSR programs may involve learning different mindfulness practices, such as breathing exercises, meditation, yoga, body scan, or mindful eating. Find a mindfulness practice that works best for you, and set aside time for it on a regular basis. This information is not intended to replace advice given to you by your health care provider. Make sure you discuss any questions you have with your health care provider. Document Revised: 07/19/2017 Document Reviewed: 12/13/2016 Elsevier Patient Education  [place].

## 2019-10-26 NOTE — Progress Notes (Addendum)
Established Patient Office Visit  Subjective:  Patient ID: Paula Kelly, female    DOB: 07-Jan-1956  Age: 64 y.o. MRN: [id number]  CC:  Chief Complaint  Patient presents with  . Follow-up    follow up on BP and diabetes, no concerns.     HPI [name] presents for follow-up of her hypertension, diabetes, elevated cholesterol, vitamin D deficiency and tobacco use.  Blood pressure controlled with the low-dose Atacand.  Diabetes continues to be controlled with the Metformin.  Continues to take atorvastatin 40 mg for her cholesterol.  Recently started high-dose vitamin D for vitamin D deficiency.  Continues to smoke.  Knows that she needs to quit.  Is been most difficult for her to do so.  She is going for her mammogram sees her GYN doctor yearly.  Sees the eye doctor every 6 months for early stages of glaucoma.  She has downsized into a townhome.  It has been difficult for her to get into an exercise routine.  Past Medical History:  Diagnosis Date  . Allergy    seasonal  . Arthritis   . Cataract   . Depression   . Diabetes mellitus without complication ([place])   . H/O fibromyalgia    had for over 10 years  . Hyperlipidemia   . Hypertension   . Post-operative nausea and vomiting     Past Surgical History:  Procedure Laterality Date  . APPENDECTOMY    . COLONOSCOPY    . kidney stone removal  2013   used a basket extraction    Family History  Problem Relation Age of Onset  . Diabetes Mother   . Heart disease Father   . Emphysema Father   . CVA Sister   . AAA (abdominal aortic aneurysm) Brother     Social History   Socioeconomic History  . Marital status: Married    Spouse name: Not on file  . Number of children: Not on file  . Years of education: Not on file  . Highest education level: Not on file  Occupational History  . Not on file  Tobacco Use  . Smoking status: Current Every Day Smoker    Packs/day: 1.00    Types: Cigarettes  . Smokeless  tobacco: Never Used  Substance and Sexual Activity  . Alcohol use: No  . Drug use: No  . Sexual activity: Not on file  Other Topics Concern  . Not on file  Social History Narrative  . Not on file   Social Determinants of Health   Financial Resource Strain:   . Difficulty of Paying Living Expenses: Not on file  Food Insecurity:   . Worried About Charity fundraiser in the Last Year: Not on file  . Ran Out of Food in the Last Year: Not on file  Transportation Needs:   . Lack of Transportation (Medical): Not on file  . Lack of Transportation (Non-Medical): Not on file  Physical Activity:   . Days of Exercise per Week: Not on file  . Minutes of Exercise per Session: Not on file  Stress:   . Feeling of Stress : Not on file  Social Connections:   . Frequency of Communication with Friends and Family: Not on file  . Frequency of Social Gatherings with Friends and Family: Not on file  . Attends Religious Services: Not on file  . Active Member of Clubs or Organizations: Not on file  . Attends Archivist Meetings: Not on file  .  Marital Status: Not on file  Intimate Partner Violence:   . Fear of Current or Ex-Partner: Not on file  . Emotionally Abused: Not on file  . Physically Abused: Not on file  . Sexually Abused: Not on file    Outpatient Medications Prior to Visit  Medication Sig Dispense Refill  . amphetamine-dextroamphetamine (ADDERALL XR) 20 MG 24 hr capsule Take 2 capsules (40 mg total) by mouth every morning. 60 capsule 0  . aspirin EC 81 MG tablet Take 1 tablet (81 mg total) by mouth daily. 365 tablet 0  . atorvastatin (LIPITOR) 40 MG tablet TAKE 1 TABLET(40 MG) BY MOUTH DAILY 100 tablet 1  . candesartan (ATACAND) 32 MG tablet Take 1 tablet (32 mg total) by mouth daily. 100 tablet 1  . Cholecalciferol (VITAMIN D3) 50 MCG (2000 UT) TABS Take 1 tablet by mouth daily. 90 tablet 2  . Coenzyme Q-10 200 MG CAPS 1-2 capsules daily for cardiovascular health    .  DULoxetine (CYMBALTA) 60 MG capsule Take 2 capsules (120 mg total) by mouth daily. [id number] capsule 1  . folic acid (FOLVITE) 1 MG tablet Take 1 mg by mouth daily.    Marland Kitchen glucose blood (FREESTYLE LITE) test strip Use to test 1-2 times daily. 100 each 5  . hydrochlorothiazide (HYDRODIURIL) 25 MG tablet Take 1 tablet (25 mg total) by mouth daily. 100 tablet 1  . Ibuprofen-Famotidine 800-26.6 MG TABS Take 1 tablet by mouth 3 (three) times daily as needed. 90 tablet 3  . metFORMIN (GLUCOPHAGE) 500 MG tablet TAKE 1 TABLET(500 MG) BY MOUTH TWICE DAILY 180 tablet 0  . methocarbamol (ROBAXIN) 500 MG tablet Take 1 tablet (500 mg total) by mouth every 8 (eight) hours as needed for muscle spasms. 40 tablet 0  . Multiple Vitamin (MULTI-VITAMINS) TABS Take 1 tablet by mouth daily.    . vitamin E 400 UNIT capsule Take 1 capsule by mouth daily.    Marland Kitchen amphetamine-dextroamphetamine (ADDERALL XR) 30 MG 24 hr capsule Will start this prescription once the 20 mg prescription has run out.    . ARIPiprazole (ABILIFY) 15 MG tablet Take by mouth.    Marland Kitchen ketorolac (TORADOL) 10 MG tablet Take 1 tablet (10 mg total) by mouth every 8 (eight) hours as needed. (Patient not taking: Reported on 07/14/2019) 20 tablet 0  . nitroGLYCERIN (NITRODUR - DOSED IN MG/24 HR) 0.2 mg/hr patch Cut and apply 1/4 patch to most painful area q24h. (Patient not taking: Reported on 10/26/2019) 30 patch 11   No facility-administered medications prior to visit.    Allergies  Allergen Reactions  . Metronidazole Rash    ROS Review of Systems  Constitutional: Negative.   HENT: Negative.   Eyes: Negative for photophobia and visual disturbance.  Respiratory: Negative.   Cardiovascular: Negative.   Gastrointestinal: Negative.   Endocrine: Negative for polyphagia and polyuria.  Genitourinary: Negative.   Musculoskeletal: Negative for gait problem and joint swelling.  Skin: Negative for pallor and rash.  Allergic/Immunologic: Negative for  immunocompromised state.  Neurological: Negative for light-headedness and numbness.  Hematological: Does not bruise/bleed easily.  Psychiatric/Behavioral: Negative.       Objective:    Physical Exam  Constitutional: She is oriented to person, place, and time. She appears well-developed and well-nourished. No distress.  HENT:  Head: Normocephalic and atraumatic.  Right Ear: External ear normal.  Left Ear: External ear normal.  Eyes: Conjunctivae are normal. Right eye exhibits no discharge. Left eye exhibits no discharge. No scleral icterus.  Neck: No JVD present. No tracheal deviation present. No thyromegaly present.  Cardiovascular: Normal rate, regular rhythm and normal heart sounds.  Pulses:      Dorsalis pedis pulses are 1+ on the right side and 1+ on the left side.       Posterior tibial pulses are 1+ on the right side and 1+ on the left side.  Pulmonary/Chest: Effort normal and breath sounds normal. No stridor.  Abdominal: Bowel sounds are normal.  Musculoskeletal:        General: No edema.  Lymphadenopathy:    She has no cervical adenopathy.  Neurological: She is alert and oriented to person, place, and time.  Skin: Skin is warm and dry. She is not diaphoretic.  Psychiatric: She has a normal mood and affect. Her behavior is normal.   Diabetic Foot Exam - Simple   Simple Foot Form Diabetic Foot exam was performed with the following findings: Yes 10/26/2019  9:11 AM  Visual Inspection See comments: Yes Sensation Testing Intact to touch and monofilament testing bilaterally: Yes Pulse Check Posterior Tibialis and Dorsalis pulse intact bilaterally: Yes Comments  Feet are cavus.  Left great toenail is thick and oncotic.    BP 138/68   Pulse (!) 104   Temp (!) 97.5 F (36.4 C) (Tympanic)   Ht 5\' 5"  (1.651 m)   Wt 228 lb (103.4 kg)   SpO2 97%   BMI 37.94 kg/m  Wt Readings from Last 3 Encounters:  10/26/19 228 lb (103.4 kg)  09/11/19 220 lb (99.8 kg)  08/05/19 220  lb (99.8 kg)     Health Maintenance Due  Topic Date Due  . MAMMOGRAM  08/07/2019  . OPHTHALMOLOGY EXAM  08/30/2019  . HEMOGLOBIN A1C  10/26/2019    There are no preventive care reminders to display for this patient.  Lab Results  Component Value Date   TSH 1.93 10/23/2017   Lab Results  Component Value Date   WBC 9.7 04/28/2019   HGB 14.6 04/28/2019   HCT 43.9 04/28/2019   MCV 91.2 04/28/2019   PLT 350.0 04/28/2019   Lab Results  Component Value Date   NA 139 04/28/2019   K 4.8 04/28/2019   CO2 27 04/28/2019   GLUCOSE 112 (H) 04/28/2019   BUN 13 04/28/2019   CREATININE 0.67 04/28/2019   BILITOT 0.4 04/28/2019   ALKPHOS 76 04/28/2019   AST 20 04/28/2019   ALT 29 04/28/2019   PROT 7.3 04/28/2019   ALBUMIN 4.6 04/28/2019   CALCIUM 9.8 04/28/2019   GFR 89.04 04/28/2019   Lab Results  Component Value Date   CHOL 194 04/28/2019   Lab Results  Component Value Date   HDL 50.80 04/28/2019   Lab Results  Component Value Date   LDLCALC 98 10/24/2018   Lab Results  Component Value Date   TRIG 219.0 (H) 04/28/2019   Lab Results  Component Value Date   CHOLHDL 4 04/28/2019   Lab Results  Component Value Date   HGBA1C 6.9 (H) 04/28/2019      Assessment & Plan:   Problem List Items Addressed This Visit      Cardiovascular and Mediastinum   Essential hypertension - Primary   Relevant Orders   CBC   Comprehensive metabolic panel   Urinalysis, Routine w reflex microscopic     Endocrine   Controlled type 2 diabetes mellitus without complication, without long-term current use of insulin (HCC)   Relevant Orders   Comprehensive metabolic panel   Hemoglobin A1c  Other   Elevated cholesterol   Relevant Orders   LDL cholesterol, direct   Lipid panel   Tobacco use   Relevant Orders   Ambulatory Referral for Lung Cancer Scre   Vitamin D deficiency   Relevant Orders   VITAMIN D 25 Hydroxy (Vit-D Deficiency, Fractures)      No orders of the  defined types were placed in this encounter.   Follow-up: Return in about 6 months (around 04/27/2020), or remember to take your lipitor..  Patient was given information on exercising to lose weight as well as mindfulness based stress reduction.  Also given information on steps to quit smoking. Copd noted on ct of chest.   Libby Maw, MD

## 2019-10-28 DIAGNOSIS — Z23 Encounter for immunization: Secondary | ICD-10-CM | POA: Diagnosis not present

## 2019-10-29 DIAGNOSIS — G4733 Obstructive sleep apnea (adult) (pediatric): Secondary | ICD-10-CM | POA: Diagnosis not present

## 2019-11-02 ENCOUNTER — Other Ambulatory Visit: Payer: Self-pay | Admitting: *Deleted

## 2019-11-02 DIAGNOSIS — F17210 Nicotine dependence, cigarettes, uncomplicated: Secondary | ICD-10-CM

## 2019-11-06 ENCOUNTER — Other Ambulatory Visit: Payer: Self-pay

## 2019-11-06 ENCOUNTER — Ambulatory Visit (HOSPITAL_BASED_OUTPATIENT_CLINIC_OR_DEPARTMENT_OTHER)
Admission: RE | Admit: 2019-11-06 | Discharge: 2019-11-06 | Disposition: A | Discharge status: Home / Self Care | Payer: Federal, State, Local not specified - PPO | Source: Ambulatory Visit | Attending: Urology | Admitting: Urology

## 2019-11-06 ENCOUNTER — Ambulatory Visit: Payer: Federal, State, Local not specified - PPO | Admitting: Family Medicine

## 2019-11-06 ENCOUNTER — Encounter: Payer: Self-pay | Admitting: Family Medicine

## 2019-11-06 VITALS — BP 127/78 | Ht 64.0 in | Wt 220.0 lb

## 2019-11-06 DIAGNOSIS — G8929 Other chronic pain: Secondary | ICD-10-CM | POA: Diagnosis not present

## 2019-11-06 DIAGNOSIS — M25512 Pain in left shoulder: Secondary | ICD-10-CM

## 2019-11-06 DIAGNOSIS — M25511 Pain in right shoulder: Secondary | ICD-10-CM

## 2019-11-06 DIAGNOSIS — S76312D Strain of muscle, fascia and tendon of the posterior muscle group at thigh level, left thigh, subsequent encounter: Secondary | ICD-10-CM

## 2019-11-06 DIAGNOSIS — Z1231 Encounter for screening mammogram for malignant neoplasm of breast: Secondary | ICD-10-CM | POA: Diagnosis not present

## 2019-11-06 MED ORDER — NITROGLYCERIN 0.2 MG/HR TD PT24: MEDICATED_PATCH | TRANSDERMAL | 11 refills | Status: DC

## 2019-11-06 NOTE — Assessment & Plan Note (Signed)
Pain is ongoing and well controlled with Duexis.  Did have calcific changes appreciated last time. -Referral to physical therapy. -Could consider rheumatologic panel.  Has only tested for uric acid at this point. -Could consider injections if needed.

## 2019-11-06 NOTE — Patient Instructions (Signed)
Good to see you Please try the nitro patches  Please try heat   Physical therapy will give you a call   Please send me a message in MyChart with any questions or updates.  Please see me back in 6-8 weeks.   --Dr. Raeford Razor  Nitroglycerin Protocol   Apply 1/4 nitroglycerin patch to affected area daily.  Change position of patch within the affected area every 24 hours.  You may experience a headache during the first 1-2 weeks of using the patch, these should subside.  If you experience headaches after beginning nitroglycerin patch treatment, you may take your preferred over the counter pain reliever.  Another side effect of the nitroglycerin patch is skin irritation or rash related to patch adhesive.  Please notify our office if you develop more severe headaches or rash, and stop the patch.  Tendon healing with nitroglycerin patch may require 12 to 24 weeks depending on the extent of injury.  Men should not use if taking Viagra, Cialis, or Levitra.   Do not use if you have migraines or rosacea.

## 2019-11-06 NOTE — Assessment & Plan Note (Signed)
Still has some pain ongoing.  Never received the nitro patches. -Try the nitro patches. -Referral to physical therapy. -Could consider imaging if needed.

## 2019-11-06 NOTE — Progress Notes (Signed)
Paula Kelly - 64 y.o. female MRN [id number]  Date of birth: [birth year]  SUBJECTIVE:  Including CC & ROS.  Chief Complaint  Patient presents with  . Follow-up    follow up for left shoulder    Paula Kelly is a 64 y.o. female that is following up for her history of pain chronic shoulder pain.  Hamstring pain has gotten better but still is occurring.  She does not feel as much pain with sitting or walking or going upstairs.  She gets improvement with the Duexis with the shoulder pain.  She notices the pain if she does not take the medication.   Review of Systems See HPI   HISTORY: Past Medical, Surgical, Social, and Family History Reviewed & Updated per EMR.   Pertinent Historical Findings include:  Past Medical History:  Diagnosis Date  . Allergy    seasonal  . Arthritis   . Cataract   . Depression   . Diabetes mellitus without complication ([place])   . H/O fibromyalgia    had for over 10 years  . Hyperlipidemia   . Hypertension   . Post-operative nausea and vomiting     Past Surgical History:  Procedure Laterality Date  . APPENDECTOMY    . COLONOSCOPY    . kidney stone removal  2013   used a basket extraction    Family History  Problem Relation Age of Onset  . Diabetes Mother   . Heart disease Father   . Emphysema Father   . CVA Sister   . AAA (abdominal aortic aneurysm) Brother     Social History   Socioeconomic History  . Marital status: Married    Spouse name: Not on file  . Number of children: Not on file  . Years of education: Not on file  . Highest education level: Not on file  Occupational History  . Not on file  Tobacco Use  . Smoking status: Current Every Day Smoker    Packs/day: 1.00    Types: Cigarettes  . Smokeless tobacco: Never Used  Substance and Sexual Activity  . Alcohol use: No  . Drug use: No  . Sexual activity: Not on file  Other Topics Concern  . Not on file  Social History Narrative  . Not on file   Social  Determinants of Health   Financial Resource Strain:   . Difficulty of Paying Living Expenses:   Food Insecurity:   . Worried About Charity fundraiser in the Last Year:   . Arboriculturist in the Last Year:   Transportation Needs:   . Film/video editor (Medical):   Marland Kitchen Lack of Transportation (Non-Medical):   Physical Activity:   . Days of Exercise per Week:   . Minutes of Exercise per Session:   Stress:   . Feeling of Stress :   Social Connections:   . Frequency of Communication with Friends and Family:   . Frequency of Social Gatherings with Friends and Family:   . Attends Religious Services:   . Active Member of Clubs or Organizations:   . Attends Archivist Meetings:   Marland Kitchen Marital Status:   Intimate Partner Violence:   . Fear of Current or Ex-Partner:   . Emotionally Abused:   Marland Kitchen Physically Abused:   . Sexually Abused:      PHYSICAL EXAM:  VS: BP 127/78   Ht 5\' 4"  (1.626 m)   Wt 220 lb (99.8 kg)   BMI 37.76 kg/m  Physical Exam Gen: NAD, alert, cooperative with exam, well-appearing MSK:  Right and left shoulder: Normal range of motion. Normal strength resistance. Normal empty can test. Neurovascular intact     ASSESSMENT & PLAN:   Strain of left hamstring Still has some pain ongoing.  Never received the nitro patches. -Try the nitro patches. -Referral to physical therapy. -Could consider imaging if needed.  Chronic pain of both shoulders Pain is ongoing and well controlled with Duexis.  Did have calcific changes appreciated last time. -Referral to physical therapy. -Could consider rheumatologic panel.  Has only tested for uric acid at this point. -Could consider injections if needed.

## 2019-11-16 ENCOUNTER — Encounter: Payer: Self-pay | Admitting: Acute Care

## 2019-11-16 ENCOUNTER — Other Ambulatory Visit: Payer: Self-pay

## 2019-11-16 ENCOUNTER — Ambulatory Visit (INDEPENDENT_AMBULATORY_CARE_PROVIDER_SITE_OTHER): Payer: Federal, State, Local not specified - PPO | Admitting: Acute Care

## 2019-11-16 ENCOUNTER — Ambulatory Visit
Admission: RE | Admit: 2019-11-16 | Discharge: 2019-11-16 | Disposition: A | Discharge status: Home / Self Care | Payer: Federal, State, Local not specified - PPO | Source: Ambulatory Visit | Attending: Acute Care | Admitting: Acute Care

## 2019-11-16 VITALS — BP 156/76 | HR 119 | Temp 97.8°F | Ht 65.0 in | Wt 230.6 lb

## 2019-11-16 DIAGNOSIS — F17210 Nicotine dependence, cigarettes, uncomplicated: Secondary | ICD-10-CM

## 2019-11-16 DIAGNOSIS — Z87891 Personal history of nicotine dependence: Secondary | ICD-10-CM | POA: Diagnosis not present

## 2019-11-16 NOTE — Patient Instructions (Signed)
Thank you for participating in the [facility]. It was our pleasure to meet you today. We will call you with the results of your scan within the next few days. Your scan will be assigned a Lung RADS category score by the physicians reading the scans.  This Lung RADS score determines follow up scanning.  See below for description of categories, and follow up screening recommendations. We will be in touch to schedule your follow up screening annually or based on recommendations of our providers. We will fax a copy of your scan results to your Primary Care Physician, or the physician who referred you to the program, to ensure they have the results. Please call the office if you have any questions or concerns regarding your scanning experience or results.  Our office number is [phone]. Please speak with Denise Phelps, RN. She is our Lung Cancer Screening RN. If she is unavailable when you call, please have the office staff send her a message. She will return your call at her earliest convenience. Remember, if your scan is normal, we will scan you annually as long as you continue to meet the criteria for the program. (Age 55-77, Current smoker or smoker who has quit within the last 15 years). If you are a smoker, remember, quitting is the single most powerful action that you can take to decrease your risk of lung cancer and other pulmonary, breathing related problems. We know quitting is hard, and we are here to help.  Please let us know if there is anything we can do to help you meet your goal of quitting. If you are a former smoker, congratulations. We are proud of you! Remain smoke free! Remember you can refer friends or family members through the number above.  We will screen them to make sure they meet criteria for the program. Thank you for helping us take better care of you by participating in Lung Screening.  Lung RADS Categories:  Lung RADS 1: no nodules  or definitely non-concerning nodules.  Recommendation is for a repeat annual scan in 12 months.  Lung RADS 2:  nodules that are non-concerning in appearance and behavior with a very low likelihood of becoming an active cancer. Recommendation is for a repeat annual scan in 12 months.  Lung RADS 3: nodules that are probably non-concerning , includes nodules with a low likelihood of becoming an active cancer.  Recommendation is for a 6-month repeat screening scan. Often noted after an upper respiratory illness. We will be in touch to make sure you have no questions, and to schedule your 6-month scan.  Lung RADS 4 A: nodules with concerning findings, recommendation is most often for a follow up scan in 3 months or additional testing based on our provider's assessment of the scan. We will be in touch to make sure you have no questions and to schedule the recommended 3 month follow up scan.  Lung RADS 4 B:  indicates findings that are concerning. We will be in touch with you to schedule additional diagnostic testing based on our provider's  assessment of the scan.   

## 2019-11-16 NOTE — Progress Notes (Signed)
Shared Decision Making Visit Lung Cancer Screening Program [phone])   Eligibility:  Age 64 y.o.  Pack Years Smoking History Calculation 47 pack year smoking history (# packs/per year x # years smoked)  Recent History of coughing up blood  no  Unexplained weight loss? no ( >Than 15 pounds within the last 6 months )  Prior History Lung / other cancer no (Diagnosis within the last 5 years already requiring surveillance chest CT Scans).  Smoking Status Current Smoker  Former Smokers: Years since quit: NA  Quit Date: NA  Visit Components:  Discussion included one or more decision making aids. yes  Discussion included risk/benefits of screening. yes  Discussion included potential follow up diagnostic testing for abnormal scans. yes  Discussion included meaning and risk of over diagnosis. yes  Discussion included meaning and risk of False Positives. yes  Discussion included meaning of total radiation exposure. yes  Counseling Included:  Importance of adherence to annual lung cancer LDCT screening. yes  Impact of comorbidities on ability to participate in the program. yes  Ability and willingness to under diagnostic treatment. yes  Smoking Cessation Counseling:  Current Smokers:   Discussed importance of smoking cessation. yes  Information about tobacco cessation classes and interventions provided to patient. yes  Patient provided with "ticket" for LDCT Scan. yes  Symptomatic Patient. no  Counseling NA  Diagnosis Code: Tobacco Use Z72.0  Asymptomatic Patient yes  Counseling (Intermediate counseling: > three minutes counseling) [id number]  Former Smokers:   Discussed the importance of maintaining cigarette abstinence. yes  Diagnosis Code: Personal History of Nicotine Dependence. [id number]  Information about tobacco cessation classes and interventions provided to patient. Yes  Patient provided with "ticket" for LDCT Scan. yes  Written Order for Lung Cancer  Screening with LDCT placed in Epic. Yes (CT Chest Lung Cancer Screening Low Dose W/O CM) [id number] Z12.2-Screening of respiratory organs Z87.891-Personal history of nicotine dependence  Patient would like to quit. She is interested in speaking with a pharmacist about all smoking cessation medications. We will schedule her with a pharmacist for smoking cessation counseling.   Magdalen Spatz, NP 11/16/2019 3:22 PM

## 2019-11-17 ENCOUNTER — Ambulatory Visit: Payer: Federal, State, Local not specified - PPO | Admitting: Physical Therapy

## 2019-11-19 ENCOUNTER — Other Ambulatory Visit: Payer: Self-pay

## 2019-11-19 ENCOUNTER — Ambulatory Visit: Payer: Federal, State, Local not specified - PPO | Attending: Family Medicine | Admitting: Physical Therapy

## 2019-11-19 DIAGNOSIS — G8929 Other chronic pain: Secondary | ICD-10-CM | POA: Insufficient documentation

## 2019-11-19 DIAGNOSIS — M25512 Pain in left shoulder: Secondary | ICD-10-CM | POA: Insufficient documentation

## 2019-11-19 DIAGNOSIS — M79652 Pain in left thigh: Secondary | ICD-10-CM | POA: Diagnosis not present

## 2019-11-19 DIAGNOSIS — M6281 Muscle weakness (generalized): Secondary | ICD-10-CM

## 2019-11-19 DIAGNOSIS — M25511 Pain in right shoulder: Secondary | ICD-10-CM | POA: Insufficient documentation

## 2019-11-19 DIAGNOSIS — R29898 Other symptoms and signs involving the musculoskeletal system: Secondary | ICD-10-CM | POA: Diagnosis not present

## 2019-11-19 DIAGNOSIS — R262 Difficulty in walking, not elsewhere classified: Secondary | ICD-10-CM

## 2019-11-19 DIAGNOSIS — M25561 Pain in right knee: Secondary | ICD-10-CM | POA: Insufficient documentation

## 2019-11-19 NOTE — Progress Notes (Signed)
Please call patient and let them  know their  low dose Ct was read as a Lung RADS 2: nodules that are benign in appearance and behavior with a very low likelihood of becoming a clinically active cancer due to size or lack of growth. Recommendation per radiology is for a repeat LDCT in 12 months. .Please let them  know we will order and schedule their  annual screening scan for 10/2020. Please let them  know there was notation of CAD on their  scan.  Please remind the patient  that this is a non-gated exam therefore degree or severity of disease  cannot be determined. Please have them  follow up with their PCP regarding potential risk factor modification, dietary therapy or pharmacologic therapy if clinically indicated. Pt.  is  currently on statin therapy. Please place order for annual  screening scan for  10/2020 and fax results to PCP. Thanks so much.

## 2019-11-19 NOTE — Therapy (Signed)
[facility] [address]  [place], [place], [address] Phone: [phone]   Fax:  [phone]  Physical Therapy Evaluation  Patient Details  Name: Paula Kelly MRN: [id number] Date of Birth: [birth year] Referring Provider (PT): Clearance Coots, MD   Encounter Date: 11/19/2019  PT End of Session - 11/19/19 1313    Visit Number  1    Number of Visits  16    Date for PT Re-Evaluation  01/14/20    Authorization Type  Federal [employer]    Authorization - Number of Visits  --   VL = 75   PT Start Time  [id number]    PT Stop Time  1404    PT Time Calculation (min)  51 min    Activity Tolerance  Patient tolerated treatment well    Behavior During Therapy  [facility] for tasks assessed/performed       Past Medical History:  Diagnosis Date  . Allergy    seasonal  . Arthritis   . Cataract   . Depression   . Diabetes mellitus without complication ([place])   . H/O fibromyalgia    had for over 10 years  . Hyperlipidemia   . Hypertension   . Post-operative nausea and vomiting     Past Surgical History:  Procedure Laterality Date  . APPENDECTOMY    . COLONOSCOPY    . kidney stone removal  2013   used a basket extraction    There were no vitals filed for this visit.   Subjective Assessment - 11/19/19 1316    Subjective  Long term fibromyalgia. 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 Has had PT on and off over past 10 yrs for neck and shoulders and has a home traction unit. Injured L HS getting in/out of new lower car. Recent onset R knee pain w/o known MOI.    Limitations  Sitting;Standing;Walking;House hold activities;Lifting    How long can you sit comfortably?  30 minutes    How long can you stand comfortably?  <30 minutes    How long can you walk comfortably?  30 minutes    Patient Stated Goals  "to be pain free ("I don't think that will ever happen again") and be able to do  more when I retire"    Currently in Pain?  Yes    Pain Score  3    up to 7/10 at worst   Pain Location  Shoulder    Pain Orientation  Right;Left    Pain Descriptors / Indicators  Sharp;Constant;Dull    Pain Type  Chronic pain    Pain Radiating Towards  up toward neck and down into B upper arms    Pain Onset  Other (comment)   "waxes and wanes over several years"   Aggravating Factors   typing, turning over in bed, weight bearing through arms    Pain Relieving Factors  exercises and Duexis    Effect of Pain on Daily Activities  interferes with ability to type at work, difficulty washing hair or back, avoids heavy lifting, avoids mopping and sweeping, driving can be painful    Multiple Pain Sites  Yes    Pain Score  1   up to 7-8/10   Pain Location  Buttocks   upper HS   Pain Orientation  Left    Pain Descriptors / Indicators  Dull;Aching;Sharp    Pain  Type  Acute pain    Pain Radiating Towards  down back of leg to mid thigh    Pain Onset  More than a month ago   ~3 months   Pain Frequency  Intermittent    Aggravating Factors   sitting, esp on toilet seat or with driving    Pain Relieving Factors  laying down    Effect of Pain on Daily Activities  limits driving tolerance, limits sitting tolerance - has to get and move around periodically    Pain Score  2   up to 5-6/10   Pain Location  Knee    Pain Orientation  Right    Pain Descriptors / Indicators  Sharp;Aching    Pain Type  Acute pain    Pain Radiating Towards  n/a    Pain Onset  In the past 7 days    Pain Frequency  Constant    Aggravating Factors   standing or walking, prolonged sitting    Pain Relieving Factors  Duexis    Effect of Pain on Daily Activities  limited sitting, standing and walking tolerance; stiff upon initiation of movement         [facility] PT Assessment - 11/19/19 1313      Assessment   Medical Diagnosis  B shoulder pain; L HS strain, R knee pain    Referring Provider (PT)  Clearance Coots, MD     Onset Date/Surgical Date  --   varies for each problem   Hand Dominance  Right    Next MD Visit  01/01/20    Prior Therapy  PT for neck and shoulder pain; PT post L knee surgery      Precautions   Precautions  None      Balance Screen   Has the patient fallen in the past 6 months  Yes    How many times?  1    Has the patient had a decrease in activity level because of a fear of falling?   No    Is the patient reluctant to leave their home because of a fear of falling?   No      Home Environment   Living Environment  Private residence    Living Arrangements  Alone    Type of [place] to enter    Entrance Stairs-Number of Steps  2    Entrance Stairs-Rails  Left    Home Layout  One level    [place] bars - tub/shower      Prior Function   Level of Independence  Independent    Vocation  Full time employment    Vocation Requirements  working from home - mostly on computer 8-8.5 hrs/day    Leisure  sleep, mostly sedentary      Cognition   Overall Cognitive Status  Within Functional Limits for tasks assessed      Posture/Postural Control   Posture/Postural Control  Postural limitations    Postural Limitations  Forward head;Rounded Shoulders;Increased thoracic kyphosis;Decreased lumbar lordosis;Flexed trunk      ROM / Strength   AROM / PROM / Strength  AROM;Strength      AROM   AROM Assessment Site  Cervical;Shoulder;Hip;Knee    Right/Left Shoulder  Right;Left    Right Shoulder Flexion  150 Degrees    Right Shoulder ABduction  130 Degrees    Right Shoulder Internal Rotation  --   FIR to T12  Right Shoulder External Rotation  --   FER to T2   Left Shoulder Flexion  150 Degrees    Left Shoulder ABduction  132 Degrees    Left Shoulder Internal Rotation  --   FIR to T12   Left Shoulder External Rotation  --   FER to T1   Cervical Flexion  35    Cervical Extension  36    Cervical - Right Side Bend  21    Cervical - Left Side  Bend  15    Cervical - Right Rotation  70    Cervical - Left Rotation  40      Strength   Strength Assessment Site  Shoulder;Hip;Knee    Right/Left Shoulder  Right;Left    Right Shoulder Flexion  4/5    Right Shoulder ABduction  4/5    Right Shoulder Internal Rotation  4+/5    Right Shoulder External Rotation  4/5    Left Shoulder Flexion  4/5    Left Shoulder ABduction  4/5    Left Shoulder Internal Rotation  4+/5    Left Shoulder External Rotation  4/5    Right/Left Hip  Right;Left    Right Hip Flexion  4/5    Right Hip Extension  4/5    Right Hip External Rotation   4-/5    Right Hip Internal Rotation  4+/5    Right Hip ABduction  4/5    Right Hip ADduction  4/5    Left Hip Flexion  4/5    Left Hip Extension  4-/5    Left Hip External Rotation  4-/5    Left Hip Internal Rotation  4/5    Left Hip ABduction  4-/5    Left Hip ADduction  4/5    Right/Left Knee  Right;Left    Right Knee Flexion  4-/5    Right Knee Extension  4/5    Left Knee Flexion  4-/5    Left Knee Extension  4+/5      Flexibility   Soft Tissue Assessment /Muscle Length  yes    Hamstrings  mild tight L    Quadriceps  mod tight B    ITB  nod tight B    Piriformis  very mild tight B    Obturator Internus  mod tight B      Palpation   Palpation comment  ttp t/o neck and B shoulder complex, esp UT, LS & pecs; ttp L proximal HS                Objective measurements completed on examination: See above findings.              PT Education - 11/19/19 1404    Education Details  PT eval findings and anticipated POC    Person(s) Educated  Patient    Methods  Explanation    Comprehension  Verbalized understanding       PT Short Term Goals - 11/19/19 1404      PT SHORT TERM GOAL #1   Title  Patient will be independent with initial HEP    Status  New    Target Date  12/10/19        PT Long Term Goals - 11/19/19 1404      PT LONG TERM GOAL #1   Title  Patient will be  independent with ongoing/advanced HEP    Status  New    Target Date  01/14/20  PT LONG TERM GOAL #2   Title  Patient will verbalize/demonstrate good awareness of neutral spine posture and proper body mechanics for daily tasks    Status  New    Target Date  01/14/20      PT LONG TERM GOAL #3   Title  Patient to improve B shoulder AROM to [facility] without pain provocation    Status  New    Target Date  01/14/20      PT LONG TERM GOAL #4   Title  Patient to demonstrate improved tissue quality and pliability with reduced pain    Status  New    Target Date  01/14/20      PT LONG TERM GOAL #5   Title  Patient will demonstrate improved B shoulder strength to >/= 4+/5 for functional UE use    Status  New    Target Date  01/14/20      PT LONG TERM GOAL #6   Title  Patient will demonstrate improved B LE strength to grossly >/= 4+/5 for improved stability and ease of mobility    Status  New    Target Date  01/14/20      PT LONG TERM GOAL #7   Title  Patient to report ability to perform ADLs, household and work-related tasks without increased pain    Status  New    Target Date  01/14/20             Plan - 11/19/19 1404    Clinical Impression Statement  Paula Kelly is a 64 y/o female who presents to OP for chronic B shoulder pain as well as subacute L hamstring strain and recent onset R knee pain. She attributes B shoulder pain to long hours on computer for work and L hamstring strain from getting up out of new low car a few months ago but is unaware of MOI for R knee pain. Pain limits ADLs, work performance, positional and movement tolerance and driving. Deficits include significant forward head and rounded/protracted B shoulder posture, mild/moderately decreased cervical and B shoulder ROM, increased muscle tension and guarding throughout B shoulder musculature and L proximal HS, as well as global mild UE/LE weakness. Paula Kelly will benefit from skilled PT to address above deficits to increase  tolerance for ADLs, work tasks, and daily activities including driving with decreased pain interference.    Personal Factors and Comorbidities  Comorbidity 3+;Time since onset of injury/illness/exacerbation;Fitness;Past/Current Experience;Profession;Age    Comorbidities  chronic B shoulder pain, L HS strain, R knee pain, fibromyaliga, DM, ADHD, L plantar fasciitis    Examination-Activity Limitations  Bathing;Caring for Others;Carry;Dressing;Hygiene/Grooming;Lift;Locomotion Level;Reach Overhead;Sit;Sleep;Squat;Stairs;Stand;Transfers    Examination-Participation Restrictions  Cleaning;Community Activity;Driving;Laundry;Meal Prep;Shop;Volunteer    Stability/Clinical Decision Making  Evolving/Moderate complexity    Clinical Decision Making  Moderate    Rehab Potential  Good    PT Frequency  2x / week    PT Duration  8 weeks    PT Treatment/Interventions  ADLs/Self Care Home Management;Cryotherapy;Electrical Stimulation;Iontophoresis 4mg /ml Dexamethasone;Moist Heat;Traction;Ultrasound;Gait training;Functional mobility training;Therapeutic activities;Therapeutic exercise;Balance training;Neuromuscular re-education;Patient/family education;Manual techniques;Passive range of motion;Dry needling;Energy conservation;Taping;Vasopneumatic Device;Spinal Manipulations;Joint Manipulations    PT Next Visit Plan  Initial HEP    Consulted and Agree with Plan of Care  Patient       Patient will benefit from skilled therapeutic intervention in order to improve the following deficits and impairments:  Decreased activity tolerance, Decreased endurance, Decreased knowledge of precautions, Decreased mobility, Decreased range of motion, Decreased safety awareness, Decreased strength, Difficulty walking, Increased  fascial restricitons, Increased muscle spasms, Impaired perceived functional ability, Impaired flexibility, Impaired UE functional use, Improper body mechanics, Postural dysfunction, Pain  Visit  Diagnosis: Chronic left shoulder pain  Chronic right shoulder pain  Pain in left thigh  Acute pain of right knee  Other symptoms and signs involving the musculoskeletal system  Muscle weakness (generalized)  Difficulty in walking, not elsewhere classified     Problem List Patient Active Problem List   Diagnosis Date Noted  . Strain of left hamstring 08/05/2019  . Chronic pain of both shoulders 07/10/2019  . Plantar fasciitis of left foot 04/28/2019  . Excessive daytime sleepiness 04/28/2019  . Snores 04/28/2019  . Vitamin D deficiency 04/28/2019  . Pain of left calf 11/18/2017  . Attention deficit hyperactivity disorder (ADHD), predominantly hyperactive type 07/25/2017  . Essential hypertension 07/25/2017  . Controlled type 2 diabetes mellitus without complication, without long-term current use of insulin ([place]) 07/25/2017  . Elevated cholesterol 07/25/2017  . Encounter for hepatitis C screening test for low risk patient 07/25/2017  . Tobacco use 07/25/2017  . Health maintenance alteration 07/25/2017  . Health care maintenance 07/25/2017  . Need for 23-polyvalent pneumococcal polysaccharide vaccine 07/25/2017  . Multiple joint pain 07/25/2017    Percival Spanish, PT, MPT 11/19/2019, 8:44 PM  [facility] [address]  [place], [place], [address] Phone: [phone]   Fax:  [phone]  Name: Paula Kelly MRN: [id number] Date of Birth: Apr 27, 1956

## 2019-11-23 ENCOUNTER — Other Ambulatory Visit: Payer: Self-pay | Admitting: *Deleted

## 2019-11-23 DIAGNOSIS — F17210 Nicotine dependence, cigarettes, uncomplicated: Secondary | ICD-10-CM

## 2019-11-23 DIAGNOSIS — J449 Chronic obstructive pulmonary disease, unspecified: Secondary | ICD-10-CM | POA: Insufficient documentation

## 2019-11-23 DIAGNOSIS — Z87891 Personal history of nicotine dependence: Secondary | ICD-10-CM

## 2019-11-25 ENCOUNTER — Other Ambulatory Visit: Payer: Self-pay

## 2019-11-25 ENCOUNTER — Ambulatory Visit: Payer: Federal, State, Local not specified - PPO

## 2019-11-25 DIAGNOSIS — R29898 Other symptoms and signs involving the musculoskeletal system: Secondary | ICD-10-CM

## 2019-11-25 DIAGNOSIS — M25511 Pain in right shoulder: Secondary | ICD-10-CM | POA: Diagnosis not present

## 2019-11-25 DIAGNOSIS — R262 Difficulty in walking, not elsewhere classified: Secondary | ICD-10-CM

## 2019-11-25 DIAGNOSIS — M25512 Pain in left shoulder: Secondary | ICD-10-CM

## 2019-11-25 DIAGNOSIS — G8929 Other chronic pain: Secondary | ICD-10-CM | POA: Diagnosis not present

## 2019-11-25 DIAGNOSIS — M6281 Muscle weakness (generalized): Secondary | ICD-10-CM

## 2019-11-25 DIAGNOSIS — M25561 Pain in right knee: Secondary | ICD-10-CM

## 2019-11-25 DIAGNOSIS — M79652 Pain in left thigh: Secondary | ICD-10-CM

## 2019-11-25 NOTE — Therapy (Signed)
[facility] [address]  [place], [place], [address] Phone: [phone]   Fax:  [phone]  Physical Therapy Treatment  Patient Details  Name: Paula Kelly MRN: [id number] Date of Birth: [birth year] Referring Provider (PT): Clearance Coots, MD   Encounter Date: 11/25/2019  PT End of Session - 11/25/19 1708    Visit Number  2    Number of Visits  16    Date for PT Re-Evaluation  01/14/20    Authorization Type  Federal [employer]    Authorization - Number of Visits  --   VL = 75   PT Start Time  1701    PT Stop Time  1745    PT Time Calculation (min)  44 min    Activity Tolerance  Patient tolerated treatment well    Behavior During Therapy  [facility] for tasks assessed/performed       Past Medical History:  Diagnosis Date  . Allergy    seasonal  . Arthritis   . Cataract   . Depression   . Diabetes mellitus without complication ([place])   . H/O fibromyalgia    had for over 10 years  . Hyperlipidemia   . Hypertension   . Post-operative nausea and vomiting     Past Surgical History:  Procedure Laterality Date  . APPENDECTOMY    . COLONOSCOPY    . kidney stone removal  2013   used a basket extraction    There were no vitals filed for this visit.  Subjective Assessment - 11/25/19 1706    Subjective  Pt. denies knee pain today.    Patient Stated Goals  "to be pain free ("I don't think that will ever happen again") and be able to do more when I retire"    Currently in Pain?  Yes    Pain Score  2     Pain Location  Shoulder    Pain Orientation  Right;Left    Pain Descriptors / Indicators  Aching;Sore    Pain Type  Chronic pain    Pain Onset  More than a month ago    Pain Frequency  Intermittent    Multiple Pain Sites  Yes    Pain Score  0   5/10 at most while sitting for work   Pain Location  Buttocks    Pain Orientation  Left    Pain Descriptors / Indicators  Sore    Pain Type  Acute pain    Pain  Radiating Towards  down back of leg to mid thigh    Pain Onset  More than a month ago    Pain Frequency  Intermittent    Pain Score  0    Pain Location  Knee                       [facility] Adult PT Treatment/Exercise - 11/25/19 0001      Self-Care   Self-Care  Other Self-Care Comments    Other Self-Care Comments   Discussed importance of HEP adherence for full benefit from PT; pt. verbalized understanding;  assisted pt. in navigating retrieving [place] HEP activities on her phone       Knee/Hip Exercises: Stretches   Passive Hamstring Stretch  Right;Left;2 reps;30 seconds    Passive Hamstring Stretch Limitations  B seated and supine with strap    pt. preferring supine with strap    Piriformis Stretch  Right;Left;2 reps;30 seconds  Piriformis Stretch Limitations  B supine and seated KTOS and figure-4      Knee/Hip Exercises: Aerobic   Nustep  Lvl 1, 6 min (UE/LE)      Knee/Hip Exercises: Supine   Other Supine Knee/Hip Exercises  Hooklying alternating red TB clam shell 3" x 10 reps       Neck Exercises: Stretches   Upper Trapezius Stretch  Right;Left;2 reps;30 seconds    Upper Trapezius Stretch Limitations  cues for proper scap. depression    hands achored on table   Levator Stretch  Right;Left;2 reps;30 seconds    Levator Stretch Limitations  cues for proper hand anchor on table     Corner Stretch  2 reps;30 seconds    Corner Stretch Limitations  B single arm mid doorway stretch              PT Education - 11/25/19 1815    Education Details  Initial HEP handout;  supine and seated KTOS stretch, supine HS stretch with strap, seated UT, LS stretch, doorway pec stretch (mid), hooklying red TB clam shell    Person(s) Educated  Patient    Methods  Explanation;Demonstration;Verbal cues;Handout    Comprehension  Verbalized understanding;Returned demonstration;Verbal cues required       PT Short Term Goals - 11/25/19 1708      PT SHORT TERM GOAL #1    Title  Patient will be independent with initial HEP    Status  On-going    Target Date  12/10/19        PT Long Term Goals - 11/25/19 1709      PT LONG TERM GOAL #1   Title  Patient will be independent with ongoing/advanced HEP    Status  On-going      PT LONG TERM GOAL #2   Title  Patient will verbalize/demonstrate good awareness of neutral spine posture and proper body mechanics for daily tasks    Status  On-going      PT LONG TERM GOAL #3   Title  Patient to improve B shoulder AROM to [facility] without pain provocation    Status  On-going      PT LONG TERM GOAL #4   Title  Patient to demonstrate improved tissue quality and pliability with reduced pain    Status  On-going      PT LONG TERM GOAL #5   Title  Patient will demonstrate improved B shoulder strength to >/= 4+/5 for functional UE use    Status  On-going      PT LONG TERM GOAL #6   Title  Patient will demonstrate improved B LE strength to grossly >/= 4+/5 for improved stability and ease of mobility    Status  On-going      PT LONG TERM GOAL #7   Title  Patient to report ability to perform ADLs, household and work-related tasks without increased pain    Status  On-going            Plan - 11/25/19 1742    Clinical Impression Statement  [name] doing noting low levels of pain today intermittently with L buttocks/shoulders.  Pain did not limit therex activities in session today.  Session focused on establishing initial flexibility HEP (see pt. education) to address proximal hip/LE and upper shoulder muscular tightness for improved work tolerance.  Pt. verbalized understanding of initial HEP handout and assisted pt. to end session with retrieving [place] activities on her phone with videos as this is pt.'s  preferred way of performing HEP from past therapy.    Comorbidities  chronic B shoulder pain, L HS strain, R knee pain, fibromyaliga, DM, ADHD, L plantar fasciitis    Rehab Potential  Good    PT  Treatment/Interventions  ADLs/Self Care Home Management;Cryotherapy;Electrical Stimulation;Iontophoresis 4mg /ml Dexamethasone;Moist Heat;Traction;Ultrasound;Gait training;Functional mobility training;Therapeutic activities;Therapeutic exercise;Balance training;Neuromuscular re-education;Patient/family education;Manual techniques;Passive range of motion;Dry needling;Energy conservation;Taping;Vasopneumatic Device;Spinal Manipulations;Joint Manipulations    PT Next Visit Plan  review initial HEP    Consulted and Agree with Plan of Care  Patient       Patient will benefit from skilled therapeutic intervention in order to improve the following deficits and impairments:  Decreased activity tolerance, Decreased endurance, Decreased knowledge of precautions, Decreased mobility, Decreased range of motion, Decreased safety awareness, Decreased strength, Difficulty walking, Increased fascial restricitons, Increased muscle spasms, Impaired perceived functional ability, Impaired flexibility, Impaired UE functional use, Improper body mechanics, Postural dysfunction, Pain  Visit Diagnosis: Chronic left shoulder pain  Chronic right shoulder pain  Pain in left thigh  Acute pain of right knee  Other symptoms and signs involving the musculoskeletal system  Muscle weakness (generalized)  Difficulty in walking, not elsewhere classified     Problem List Patient Active Problem List   Diagnosis Date Noted  . Chronic obstructive pulmonary disease ([place]) 11/23/2019  . Strain of left hamstring 08/05/2019  . Chronic pain of both shoulders 07/10/2019  . Plantar fasciitis of left foot 04/28/2019  . Excessive daytime sleepiness 04/28/2019  . Snores 04/28/2019  . Vitamin D deficiency 04/28/2019  . Pain of left calf 11/18/2017  . Attention deficit hyperactivity disorder (ADHD), predominantly hyperactive type 07/25/2017  . Essential hypertension 07/25/2017  . Controlled type 2 diabetes mellitus without  complication, without long-term current use of insulin ([place]) 07/25/2017  . Elevated cholesterol 07/25/2017  . Encounter for hepatitis C screening test for low risk patient 07/25/2017  . Tobacco use 07/25/2017  . Health maintenance alteration 07/25/2017  . Health care maintenance 07/25/2017  . Need for 23-polyvalent pneumococcal polysaccharide vaccine 07/25/2017  . Multiple joint pain 07/25/2017    Bess Harvest, PTA 11/25/19 [facility] [address]  [place], [place], [address] Phone: [phone]   Fax:  [phone]  Name: NOELIA KALFAS MRN: [id number] Date of Birth: [birth year]

## 2019-11-26 NOTE — Progress Notes (Signed)
Subjective Patient last seen and referred by Eric Form, NP, on 11/16/2019 for smoking cessation. PMH is significant for COPD, HTN, T2DM, ADHD, and vitamin D deficiency.  Patient presents to [facility] and seen by the pharmacist for smoking cessation counseling.      Social History   Tobacco Use  Smoking Status Current Every Day Smoker  . Packs/day: 1.00  . Years: 47.00  . Pack years: 47.00  . Types: Cigarettes  Smokeless Tobacco Never Used     Tobacco Use History  Age when started using tobacco on a daily basis 64 years old.  Type: cigarettes.  Number of cigarettes per day 20, brand [place] [employer]s green (menthol).  Smokes first cigarette within first 5 minutes after waking.  Does not wake at night to smoke  Triggers include coffee, boredom, meals, stress.  Quit Attempt History   Most recent quit attempt > 10 years ago.  Last quit attempt was horrible (felt like she had no skin, crying a lot, very painful) - she is nervous to quit again  Longest time ever been tobacco free 5 years ago.  Methods tried in the past include   Wellbutrin - lacked efficacy (does not remember dose, was on it for multiple years)  Hypnotherapy - helpful, she reports she found it relaxing (tried > 30 years ago)  Acupuncture  [place] - helpful procedure (did procedure through her ears)  [place] - not helpful procedure (did procedure at different places than ear)  Chantix - no side effects, but thinks she may have discontinued this medication due to HTN or depression or chronic pain  Nicotine patch - lacked efficacy   Nicotine gum - nasty, no flavor  Nicotine lozenge - nasty, no flavor  Rates IMPORTANCE of quitting tobacco on 1-10 scale of 6-7.  Rates READINESS of quitting tobacco on 1-10 scale of 6.  Rates CONFIDENCE of quitting tobacco on 1-10 scale of 4.  Motivators to quitting include retirement, wants to travel, money, "it stinks"; barriers include fear of painful experience  quitting    Immunization History  Administered Date(s) Administered  . Influenza,inj,Quad PF,6+ Mos 04/25/2018, 04/28/2019  . Influenza-Unspecified 05/12/2016, 04/17/2017  . Pneumococcal Polysaccharide-23 07/25/2017  . Tdap 10/19/2015     Assessment and Plan  1. Smoking Cessation   Patient states they are ready to quit smoking.  Patient is not ready to get a quit date yet, however, she is ready to start smoking cessation agents and re-assess readiness to quit smoking.   Discussed smoking cessation agent Chant, Wellbutrin, and nicotine replacement therapies.  Patient is agreeable to Chantix, nicotine patch, and nicotine lozenge.   Nicotine Patch Patient counseled on purpose, proper use, and potential adverse effects, including mild itching or redness at the point of application, headache, trouble sleeping, and/or vivid dreams. She will obtain from [facility] Quitline.     Patch Schedule for >10 cigarettes daily Weeks 1-6: one 21 mg patch daily Weeks 7-8: one 14 mg patch daily Weeks 9-10: one 7 mg patch daily  Nicotine Lozenge Patient counseled on purpose, proper use, and potential adverse effects including nausea, hiccups, cough, and heartburn.  Instructed patient to use  2 mg unless the smoke within 30 minutes of waking up in which they should use 4 mg. She will obtain from [facility] Quitline.  Lozenge dosing schedule Weeks 1 to 6: 1 lozenge every 1 to 2 hours (maximum: 5 lozenges every 6 hours; 20 lozenges/day); to increase chances of quitting, use at least 9 lozenges/day during  the first 6 weeks Weeks 7 to 9: 1 lozenge every 2 to 4 hours (maximum: 5 lozenges every 6 hours; 20 lozenges/day) Weeks 10 to 12: 1 lozenge every 4 to 8 hours (maximum: 5 lozenges every 6 hours; 20 lozenges/day)  Varenicline Initiated varenicline titration of 0.5 mg by mouth once daily with food x3 days, then 0.5 mg by mouth twice daily with food x4 days, then 1 mg by mouth twice daily with food thereafter.  CrCL  greater than 30 mL/min. Patient counseled on purpose, proper use, and potential adverse effects, including GI upset, and potential change in mood. Chantix will cost $133 for 30-day supply. Sent in prescription with copay card so Chantix will cost $0 for 30 day supply.  Provided information on 1 800-QUIT NOW support program.   2. Medication Reconciliation A drug regimen assessment was performed, including review of allergies, interactions, disease-state management, dosing and immunization history. Medications were reviewed with the patient, including name, instructions, indication, goals of therapy, potential side effects, importance of adherence, and safe use.  3. Immunizations Patient is indicated for the shingles vaccination. Sent in rx to patient's preferred pharmacy.  This appointment required 40 minutes of patient care (this includes precharting, chart review, review of results, face-to-face care, etc.).  Thank you for involving pharmacy to assist in providing this patient's care.   Drexel Iha, PharmD PGY2 Ambulatory Care Pharmacy Resident

## 2019-11-27 DIAGNOSIS — Z23 Encounter for immunization: Secondary | ICD-10-CM | POA: Diagnosis not present

## 2019-11-29 DIAGNOSIS — G4733 Obstructive sleep apnea (adult) (pediatric): Secondary | ICD-10-CM | POA: Diagnosis not present

## 2019-11-30 ENCOUNTER — Telehealth: Payer: Self-pay | Admitting: Pharmacist

## 2019-11-30 ENCOUNTER — Other Ambulatory Visit: Payer: Self-pay

## 2019-11-30 ENCOUNTER — Ambulatory Visit (INDEPENDENT_AMBULATORY_CARE_PROVIDER_SITE_OTHER): Payer: Federal, State, Local not specified - PPO | Admitting: Pharmacist

## 2019-11-30 DIAGNOSIS — Z0000 Encounter for general adult medical examination without abnormal findings: Secondary | ICD-10-CM

## 2019-11-30 DIAGNOSIS — F17210 Nicotine dependence, cigarettes, uncomplicated: Secondary | ICD-10-CM

## 2019-11-30 MED ORDER — CHANTIX STARTING MONTH PAK 0.5 MG X 11 & 1 MG X 42 PO TABS: ORAL_TABLET | ORAL | 0 refills | Status: AC

## 2019-11-30 MED ORDER — VARENICLINE TARTRATE 1 MG PO TABS: 1.0000 mg | ORAL_TABLET | Freq: Two times a day (BID) | ORAL | 5 refills | Status: DC

## 2019-11-30 MED ORDER — ZOSTER VAC RECOMB ADJUVANTED 50 MCG/0.5ML IM SUSR: 0.5000 mL | Freq: Once | INTRAMUSCULAR | 1 refills | Status: AC

## 2019-11-30 NOTE — Telephone Encounter (Signed)
Called patient on 11/30/2019 at 5:28 PM   Notified patient that I contacted [facility] staff member who confirmed copay card was applied to Chantix prescription and would have $0 copay. Patient expressed appreciation for the call.  Thank you for involving pharmacy to assist in providing this patient's care.   Drexel Iha, PharmD PGY2 Ambulatory Care Pharmacy Resident

## 2019-11-30 NOTE — Patient Instructions (Addendum)
It was a pleasure seeing you in clinic today Ms. Mccleary!  Today the plan is...  1. START nicotine patch 21 mg daily (call [place] [facility] Quitline to get medication) Patch Schedule for >10 cigarettes daily Weeks 1-6: one 21 mg patch daily Weeks 7-8: one 14 mg patch daily Weeks 9-10: one 7 mg patch daily  2. START nicotine lozenge 4 mg Lozenge dosing schedule Weeks 1 to 6: 1 lozenge every 1 to 2 hours (maximum: 5 lozenges every 6 hours; 20 lozenges/day); to increase chances of quitting, use at least 9 lozenges/day during the first 6 weeks Weeks 7 to 9: 1 lozenge every 2 to 4 hours (maximum: 5 lozenges every 6 hours; 20 lozenges/day) Weeks 10 to 12: 1 lozenge every 4 to 8 hours (maximum: 5 lozenges every 6 hours; 20 lozenges/day)  3. START varenicline (Chantix) - prescription sent to [facility] Initiated varenicline titration of 0.5 mg by mouth once daily with food x3 days, then 0.5 mg by mouth twice daily with food x4 days, then 1 mg by mouth twice daily with food thereafter.  4. Shingles vaccine sent to [facility]. Get vaccine ~1 month after COVID-19 vaccine  Please call the PharmD clinic at [phone] if you have any questions that you would like to speak with a pharmacist about [facility], [employer]/gallery conservator).

## 2019-12-01 ENCOUNTER — Ambulatory Visit: Payer: Federal, State, Local not specified - PPO

## 2019-12-01 DIAGNOSIS — R29898 Other symptoms and signs involving the musculoskeletal system: Secondary | ICD-10-CM | POA: Diagnosis not present

## 2019-12-01 DIAGNOSIS — M25512 Pain in left shoulder: Secondary | ICD-10-CM

## 2019-12-01 DIAGNOSIS — R262 Difficulty in walking, not elsewhere classified: Secondary | ICD-10-CM

## 2019-12-01 DIAGNOSIS — M25561 Pain in right knee: Secondary | ICD-10-CM

## 2019-12-01 DIAGNOSIS — G8929 Other chronic pain: Secondary | ICD-10-CM

## 2019-12-01 DIAGNOSIS — M79652 Pain in left thigh: Secondary | ICD-10-CM | POA: Diagnosis not present

## 2019-12-01 DIAGNOSIS — M6281 Muscle weakness (generalized): Secondary | ICD-10-CM | POA: Diagnosis not present

## 2019-12-01 DIAGNOSIS — M25511 Pain in right shoulder: Secondary | ICD-10-CM | POA: Diagnosis not present

## 2019-12-01 NOTE — Therapy (Signed)
[facility] [place] [address]  [place], [place], [address] Phone: [phone]   Fax:  [phone]  Physical Therapy Treatment  Patient Details  Name: Paula Kelly MRN: [id number] Date of Birth: 12-26-1955 Referring Provider (PT): Clearance Coots, MD   Encounter Date: 12/01/2019  PT End of Session - 12/01/19 1709    Visit Number  3    Number of Visits  16    Date for PT Re-Evaluation  01/14/20    Authorization Type  Federal BCBS    Authorization - Number of Visits  --   VL = 75   PT Start Time  1701    PT Stop Time  1745    PT Time Calculation (min)  44 min    Activity Tolerance  Patient tolerated treatment well    Behavior During Therapy  [facility] for tasks assessed/performed       Past Medical History:  Diagnosis Date  . Allergy    seasonal  . Arthritis   . Cataract   . Depression   . Diabetes mellitus without complication ([place])   . H/O fibromyalgia    had for over 10 years  . Hyperlipidemia   . Hypertension   . Post-operative nausea and vomiting     Past Surgical History:  Procedure Laterality Date  . APPENDECTOMY    . COLONOSCOPY    . kidney stone removal  2013   used a basket extraction    There were no vitals filed for this visit.  Subjective Assessment - 12/01/19 1705    Subjective  Pt. reporting work has been stressful and she has had increased hip pain.    Patient Stated Goals  "to be pain free ("I don't think that will ever happen again") and be able to do more when I retire"    Currently in Pain?  Yes    Pain Score  0-No pain   up to 4/10 during the day   Pain Location  Shoulder    Pain Orientation  Right;Left    Pain Descriptors / Indicators  Aching;Sore    Pain Type  Chronic pain    Pain Onset  More than a month ago    Pain Frequency  Intermittent    Multiple Pain Sites  Yes    Pain Score  0   up to 5/10   Pain Location  Buttocks    Pain Orientation  Left    Pain Descriptors /  Indicators  Sore    Pain Type  Acute pain    Pain Frequency  Intermittent    Aggravating Factors   sitting for work                       [facility] Adult PT Treatment/Exercise - 12/01/19 0001      Neck Exercises: Theraband   Rows  15 reps;Red    Rows Limitations  cues for scap. retraction/depression provided       Knee/Hip Exercises: Stretches   Passive Hamstring Stretch  Right;Left;2 reps;30 seconds    Passive Hamstring Stretch Limitations  B supine with strap     Hip Flexor Stretch  Right;Left;1 rep;30 seconds    Hip Flexor Stretch Limitations  seated hip flexor stretch (half lunge pos), mod thomas stretch with strap     Piriformis Stretch  Right;Left;2 reps;30 seconds   Minor cueing to sit tall    Piriformis Stretch Limitations  B sitting figure-4  Other Knee/Hip Stretches  B supine KTOS x 30 sec       Knee/Hip Exercises: Aerobic   Nustep  Lvl 3, 6 min (UE/LE)      Knee/Hip Exercises: Supine   Bridges with Clamshell  Both;10 reps;Strengthening   B hip abde/ER into red looped TB at knees    Other Supine Knee/Hip Exercises  Hooklying alternating red TB clam shell 3" x 10 reps    cues for slow movement pattern      Knee/Hip Exercises: Sidelying   Clams  B yellow TB clam shell x 10 rpes       Neck Exercises: Stretches   Upper Trapezius Stretch  Right;Left;30 seconds;1 rep    Upper Trapezius Stretch Limitations  good understanding of hand positioning behind back or holding table     Levator Stretch  Right;Left;30 seconds;1 rep    Levator Stretch Limitations  holding table     Corner Stretch  2 reps;30 seconds    Corner Stretch Limitations  B single arm mid doorway stretch     Chest Stretch  1 rep;30 seconds   B low in doorway standing               PT Short Term Goals - 12/01/19 1751      PT SHORT TERM GOAL #1   Title  Patient will be independent with initial HEP    Status  Achieved    Target Date  12/10/19        PT Long Term Goals -  11/25/19 1709      PT LONG TERM GOAL #1   Title  Patient will be independent with ongoing/advanced HEP    Status  On-going      PT LONG TERM GOAL #2   Title  Patient will verbalize/demonstrate good awareness of neutral spine posture and proper body mechanics for daily tasks    Status  On-going      PT LONG TERM GOAL #3   Title  Patient to improve B shoulder AROM to [facility] without pain provocation    Status  On-going      PT LONG TERM GOAL #4   Title  Patient to demonstrate improved tissue quality and pliability with reduced pain    Status  On-going      PT LONG TERM GOAL #5   Title  Patient will demonstrate improved B shoulder strength to >/= 4+/5 for functional UE use    Status  On-going      PT LONG TERM GOAL #6   Title  Patient will demonstrate improved B LE strength to grossly >/= 4+/5 for improved stability and ease of mobility    Status  On-going      PT LONG TERM GOAL #7   Title  Patient to report ability to perform ADLs, household and work-related tasks without increased pain    Status  On-going            Plan - 12/01/19 1709    Clinical Impression Statement  Paula Kelly reporting some "run down" feelings after second COVID-19 injection last week.  Able to demo excellent understanding of initial HEP with review today.  Able to tolerate progression to bridge/abduction with red TB and row with band without pain.  Ended visit with pt. denying pain.  STG #1 achieved.  May consider updating HEP with postural strengthening activities in coming session.    Comorbidities  chronic B shoulder pain, L HS strain, R knee pain, fibromyaliga, DM, ADHD,  L plantar fasciitis    Rehab Potential  Good    PT Treatment/Interventions  ADLs/Self Care Home Management;Cryotherapy;Electrical Stimulation;Iontophoresis 4mg /ml Dexamethasone;Moist Heat;Traction;Ultrasound;Gait training;Functional mobility training;Therapeutic activities;Therapeutic exercise;Balance training;Neuromuscular  re-education;Patient/family education;Manual techniques;Passive range of motion;Dry needling;Energy conservation;Taping;Vasopneumatic Device;Spinal Manipulations;Joint Manipulations    PT Next Visit Plan  consider HEP update with band-resisted scapular strengthening    Consulted and Agree with Plan of Care  Patient       Patient will benefit from skilled therapeutic intervention in order to improve the following deficits and impairments:  Decreased activity tolerance, Decreased endurance, Decreased knowledge of precautions, Decreased mobility, Decreased range of motion, Decreased safety awareness, Decreased strength, Difficulty walking, Increased fascial restricitons, Increased muscle spasms, Impaired perceived functional ability, Impaired flexibility, Impaired UE functional use, Improper body mechanics, Postural dysfunction, Pain  Visit Diagnosis: Chronic left shoulder pain  Chronic right shoulder pain  Pain in left thigh  Acute pain of right knee  Other symptoms and signs involving the musculoskeletal system  Muscle weakness (generalized)  Difficulty in walking, not elsewhere classified     Problem List Patient Active Problem List   Diagnosis Date Noted  . Chronic obstructive pulmonary disease ([place]) 11/23/2019  . Strain of left hamstring 08/05/2019  . Chronic pain of both shoulders 07/10/2019  . Plantar fasciitis of left foot 04/28/2019  . Excessive daytime sleepiness 04/28/2019  . Snores 04/28/2019  . Vitamin D deficiency 04/28/2019  . Pain of left calf 11/18/2017  . Attention deficit hyperactivity disorder (ADHD), predominantly hyperactive type 07/25/2017  . Essential hypertension 07/25/2017  . Controlled type 2 diabetes mellitus without complication, without long-term current use of insulin ([place]) 07/25/2017  . Elevated cholesterol 07/25/2017  . Encounter for hepatitis C screening test for low risk patient 07/25/2017  . Tobacco use 07/25/2017  . Health maintenance  alteration 07/25/2017  . Health care maintenance 07/25/2017  . Need for 23-polyvalent pneumococcal polysaccharide vaccine 07/25/2017  . Multiple joint pain 07/25/2017    Bess Harvest, PTA 12/01/19 5:58 PM   [facility] [address]  [place], [place], [address] Phone: [phone]   Fax:  [phone]  Name: Paula Kelly MRN: [id number] Date of Birth: Mar 02, 1956

## 2019-12-03 ENCOUNTER — Other Ambulatory Visit: Payer: Self-pay

## 2019-12-03 ENCOUNTER — Encounter: Payer: Self-pay | Admitting: Physical Therapy

## 2019-12-03 ENCOUNTER — Ambulatory Visit: Payer: Federal, State, Local not specified - PPO | Admitting: Physical Therapy

## 2019-12-03 DIAGNOSIS — M79652 Pain in left thigh: Secondary | ICD-10-CM

## 2019-12-03 DIAGNOSIS — M25511 Pain in right shoulder: Secondary | ICD-10-CM | POA: Diagnosis not present

## 2019-12-03 DIAGNOSIS — R29898 Other symptoms and signs involving the musculoskeletal system: Secondary | ICD-10-CM | POA: Diagnosis not present

## 2019-12-03 DIAGNOSIS — M6281 Muscle weakness (generalized): Secondary | ICD-10-CM

## 2019-12-03 DIAGNOSIS — M25512 Pain in left shoulder: Secondary | ICD-10-CM | POA: Diagnosis not present

## 2019-12-03 DIAGNOSIS — G8929 Other chronic pain: Secondary | ICD-10-CM | POA: Diagnosis not present

## 2019-12-03 DIAGNOSIS — R262 Difficulty in walking, not elsewhere classified: Secondary | ICD-10-CM

## 2019-12-03 DIAGNOSIS — M25561 Pain in right knee: Secondary | ICD-10-CM | POA: Diagnosis not present

## 2019-12-03 NOTE — Patient Instructions (Signed)
    Home exercise program created by Raidyn Wassink, PT.  For questions, please contact Beena Catano via phone at [phone] or email at [email or web address]  [facility] [place] [address]  [address] [place], [place], [address] Phone: [phone]   Fax:  [phone]    

## 2019-12-03 NOTE — Therapy (Addendum)
[facility] [address]  [place], [place], [address] Phone: [phone]   Fax:  [phone]  Physical Therapy Treatment  Patient Details  Name: Paula Kelly MRN: [id number] Date of Birth: May 08, 1956 Referring Provider (PT): Clearance Coots, MD   Encounter Date: 12/03/2019  PT End of Session - 12/03/19 1618    Visit Number  4    Number of Visits  16    Date for PT Re-Evaluation  01/14/20    Authorization Type  Federal BCBS    Authorization - Number of Visits  --   VL = 75   PT Start Time  1618    PT Stop Time  1702    PT Time Calculation (min)  44 min    Activity Tolerance  Patient tolerated treatment well    Behavior During Therapy  [facility] for tasks assessed/performed       Past Medical History:  Diagnosis Date  . Allergy    seasonal  . Arthritis   . Cataract   . Depression   . Diabetes mellitus without complication ([place])   . H/O fibromyalgia    had for over 10 years  . Hyperlipidemia   . Hypertension   . Post-operative nausea and vomiting     Past Surgical History:  Procedure Laterality Date  . APPENDECTOMY    . COLONOSCOPY    . kidney stone removal  2013   used a basket extraction    There were no vitals filed for this visit.  Subjective Assessment - 12/03/19 1621    Subjective  Pt reports "just some overall soreness" today but denies rateable pain.    Patient Stated Goals  "to be pain free ("I don't think that will ever happen again") and be able to do more when I retire"    Currently in Pain?  No/denies                       [facility] Adult PT Treatment/Exercise - 12/03/19 1618      Knee/Hip Exercises: Stretches   Passive Hamstring Stretch  Left;30 seconds;3 reps    Passive Hamstring Stretch Limitations  seated hip hinge with foot on 9" stool - LE in neutral as well as IR & ER to isolated medial and lateral HS      Knee/Hip Exercises: Aerobic   Nustep  L3 x 6 min       Knee/Hip Exercises: Standing   Knee Flexion  Left;10 reps;Strengthening    Knee Flexion Limitations  HS curl with looped red TB at feet      Knee/Hip Exercises: Seated   Hamstring Curl  Left;10 reps;Strengthening    Hamstring Limitations  looped red TB at feet      Manual Therapy   Manual Therapy  Soft tissue mobilization;Myofascial release;Passive ROM             PT Education - 12/03/19 1700    Education Details  HEP update - seated alternative for HS stretch + medial/lateral isolation, seated and standing red TB HS curls    Person(s) Educated  Patient    Methods  Explanation;Demonstration;Verbal cues;Handout    Comprehension  Verbalized understanding;Returned demonstration;Verbal cues required;Need further instruction       PT Short Term Goals - 12/03/19 1623      PT SHORT TERM GOAL #1   Title  Patient will be independent with initial HEP    Status  Achieved  12/01/19       PT Long Term Goals - 11/25/19 1709      PT LONG TERM GOAL #1   Title  Patient will be independent with ongoing/advanced HEP    Status  On-going      PT LONG TERM GOAL #2   Title  Patient will verbalize/demonstrate good awareness of neutral spine posture and proper body mechanics for daily tasks    Status  On-going      PT LONG TERM GOAL #3   Title  Patient to improve B shoulder AROM to [facility] without pain provocation    Status  On-going      PT LONG TERM GOAL #4   Title  Patient to demonstrate improved tissue quality and pliability with reduced pain    Status  On-going      PT LONG TERM GOAL #5   Title  Patient will demonstrate improved B shoulder strength to >/= 4+/5 for functional UE use    Status  On-going      PT LONG TERM GOAL #6   Title  Patient will demonstrate improved B LE strength to grossly >/= 4+/5 for improved stability and ease of mobility    Status  On-going      PT LONG TERM GOAL #7   Title  Patient to report ability to perform ADLs, household and work-related tasks  without increased pain    Status  On-going            Plan - 12/03/19 1623    Clinical Impression Statement  Paula Kelly denies ratable pain today, noting "just overall soreness" but states hamstring pain remains most problematic with prolonged sitting and would like to focus on this today. Manual STM/DTM and TPR to proximal medial > lateral HS as well as proximal L hip adductors eliciting multiple twitch responses and palpable reduction in muscle tension. Reviewed HS stretches with patient admitting she has not been doing HS stretch as she was not able to locate a strap at home, therefore provided instruction in seated alternative adding medial and lateral hip rotation to better isolate medial and lateral HS as patient relatively flexible at baseline. Manual therapy and stretches followed with HS strengthening to promote normalization of muscle activity. Patient also instructed in self-STM for proximal HS using tennis ball or other small firm ball in sitting.    Personal Factors and Comorbidities  Comorbidity 3+;Time since onset of injury/illness/exacerbation;Fitness;Past/Current Experience;Profession;Age    Comorbidities  chronic B shoulder pain, L HS strain, R knee pain, fibromyaliga, DM, ADHD, L plantar fasciitis    Examination-Activity Limitations  Bathing;Caring for Others;Carry;Dressing;Hygiene/Grooming;Lift;Locomotion Level;Reach Overhead;Sit;Sleep;Squat;Stairs;Stand;Transfers    Examination-Participation Restrictions  Cleaning;Community Activity;Driving;Laundry;Meal Prep;Shop;Volunteer    Rehab Potential  Good    PT Frequency  2x / week    PT Duration  8 weeks    PT Treatment/Interventions  ADLs/Self Care Home Management;Cryotherapy;Electrical Stimulation;Iontophoresis 4mg /ml Dexamethasone;Moist Heat;Traction;Ultrasound;Gait training;Functional mobility training;Therapeutic activities;Therapeutic exercise;Balance training;Neuromuscular re-education;Patient/family education;Manual  techniques;Passive range of motion;Dry needling;Energy conservation;Taping;Vasopneumatic Device;Spinal Manipulations;Joint Manipulations    PT Next Visit Plan  address B shoulder, L HS and R knee pain per patient's preference (area or current/greatest concern); consider HEP update with band-resisted scapular strengthening    PT Home Exercise Plan  11/25/19 - supine and seated KTOS stretch, supine HS stretch with strap, seated UT, LS stretch, doorway pec stretch (mid), hooklying red TB clam shell; 12/03/19 - seated alternative for HS stretch + medial/lateral isolation, seated and standing red TB HS curls    Consulted  and Agree with Plan of Care  Patient       Patient will benefit from skilled therapeutic intervention in order to improve the following deficits and impairments:  Decreased activity tolerance, Decreased endurance, Decreased knowledge of precautions, Decreased mobility, Decreased range of motion, Decreased safety awareness, Decreased strength, Difficulty walking, Increased fascial restricitons, Increased muscle spasms, Impaired perceived functional ability, Impaired flexibility, Impaired UE functional use, Improper body mechanics, Postural dysfunction, Pain  Visit Diagnosis: Chronic left shoulder pain  Chronic right shoulder pain  Pain in left thigh  Acute pain of right knee  Other symptoms and signs involving the musculoskeletal system  Muscle weakness (generalized)  Difficulty in walking, not elsewhere classified     Problem List Patient Active Problem List   Diagnosis Date Noted  . Chronic obstructive pulmonary disease ([place]) 11/23/2019  . Strain of left hamstring 08/05/2019  . Chronic pain of both shoulders 07/10/2019  . Plantar fasciitis of left foot 04/28/2019  . Excessive daytime sleepiness 04/28/2019  . Snores 04/28/2019  . Vitamin D deficiency 04/28/2019  . Pain of left calf 11/18/2017  . Attention deficit hyperactivity disorder (ADHD), predominantly hyperactive  type 07/25/2017  . Essential hypertension 07/25/2017  . Controlled type 2 diabetes mellitus without complication, without long-term current use of insulin ([place]) 07/25/2017  . Elevated cholesterol 07/25/2017  . Encounter for hepatitis C screening test for low risk patient 07/25/2017  . Tobacco use 07/25/2017  . Health maintenance alteration 07/25/2017  . Health care maintenance 07/25/2017  . Need for 23-polyvalent pneumococcal polysaccharide vaccine 07/25/2017  . Multiple joint pain 07/25/2017    Percival Spanish, PT, MPT 12/03/2019, 5:31 PM  [facility] [address]  [place], [place], [address] Phone: [phone]   Fax:  [phone]  Name: Paula Kelly MRN: [id number] Date of Birth: 1956-02-17

## 2019-12-07 ENCOUNTER — Encounter: Payer: Federal, State, Local not specified - PPO | Admitting: Physical Therapy

## 2019-12-10 ENCOUNTER — Other Ambulatory Visit: Payer: Self-pay

## 2019-12-10 ENCOUNTER — Ambulatory Visit: Payer: Federal, State, Local not specified - PPO

## 2019-12-10 DIAGNOSIS — R29898 Other symptoms and signs involving the musculoskeletal system: Secondary | ICD-10-CM | POA: Diagnosis not present

## 2019-12-10 DIAGNOSIS — M25511 Pain in right shoulder: Secondary | ICD-10-CM

## 2019-12-10 DIAGNOSIS — M6281 Muscle weakness (generalized): Secondary | ICD-10-CM

## 2019-12-10 DIAGNOSIS — G8929 Other chronic pain: Secondary | ICD-10-CM

## 2019-12-10 DIAGNOSIS — M79652 Pain in left thigh: Secondary | ICD-10-CM | POA: Diagnosis not present

## 2019-12-10 DIAGNOSIS — M25561 Pain in right knee: Secondary | ICD-10-CM | POA: Diagnosis not present

## 2019-12-10 DIAGNOSIS — R262 Difficulty in walking, not elsewhere classified: Secondary | ICD-10-CM

## 2019-12-10 DIAGNOSIS — M25512 Pain in left shoulder: Secondary | ICD-10-CM | POA: Diagnosis not present

## 2019-12-10 NOTE — Therapy (Signed)
[facility] [place] [facility] [address]  [place], [place], [address] Phone: [phone]   Fax:  [phone]  Physical Therapy Treatment  Patient Details  Name: Paula Kelly MRN: [id number] Date of Birth: [birth year] Referring Provider (PT): Clearance Coots, MD   Encounter Date: 12/10/2019  PT End of Session - 12/10/19 1754    Visit Number  5    Number of Visits  16    Date for PT Re-Evaluation  01/14/20    Authorization Type  Federal [employer]    Authorization - Number of Visits  --   VL = 75   PT Start Time  1702    PT Stop Time  1750    PT Time Calculation (min)  48 min    Activity Tolerance  Patient tolerated treatment well    Behavior During Therapy  [facility] for tasks assessed/performed       Past Medical History:  Diagnosis Date  . Allergy    seasonal  . Arthritis   . Cataract   . Depression   . Diabetes mellitus without complication ([place])   . H/O fibromyalgia    had for over 10 years  . Hyperlipidemia   . Hypertension   . Post-operative nausea and vomiting     Past Surgical History:  Procedure Laterality Date  . APPENDECTOMY    . COLONOSCOPY    . kidney stone removal  2013   used a basket extraction    There were no vitals filed for this visit.  Subjective Assessment - 12/10/19 1707    Subjective  Pt reports she is "stuff up today due to allergens and work being done at her house with sawdust everywhere". Pt reports feeling better after last session - now it's more her shoulders, but she didn't work today so feels better.    Patient Stated Goals  "to be pain free ("I don't think that will ever happen again") and be able to do more when I retire"    Currently in Pain?  Yes    Pain Score  3     Pain Location  Hip    Pain Orientation  Right    Multiple Pain Sites  Yes    Pain Score  3    Pain Location  Shoulder    Pain Orientation  Right;Left                       [facility] Adult PT  Treatment/Exercise - 12/10/19 0001      Exercises   Exercises  Shoulder      Knee/Hip Exercises: Aerobic   Nustep  L6 x 6 min   end of session     Shoulder Exercises: Seated   Row  Strengthening;Both;15 reps;Weights    Row Weight (lbs)  20# 1st set, 25# 2nd set    Other Seated Exercises  thoracic extension over chair x 15 HBH      Shoulder Exercises: Sidelying   Other Sidelying Exercises  Open books x 15 each      Manual Therapy   Manual Therapy  Joint mobilization;Soft tissue mobilization;Passive ROM    Joint Mobilization  grade III post/inf GHJ mobs    Soft tissue mobilization  UT, LS, C/S PS    Passive ROM  B shoulders all planes             PT Education - 12/10/19 1754    Education Details  stretching throughout the  day with standing at wall, extensions over chair, S/L book openers    Person(s) Educated  Patient    Methods  Explanation;Demonstration;Verbal cues;Tactile cues    Comprehension  Verbalized understanding       PT Short Term Goals - 12/03/19 1623      PT SHORT TERM GOAL #1   Title  Patient will be independent with initial HEP    Status  Achieved   12/01/19       PT Long Term Goals - 11/25/19 1709      PT LONG TERM GOAL #1   Title  Patient will be independent with ongoing/advanced HEP    Status  On-going      PT LONG TERM GOAL #2   Title  Patient will verbalize/demonstrate good awareness of neutral spine posture and proper body mechanics for daily tasks    Status  On-going      PT LONG TERM GOAL #3   Title  Patient to improve B shoulder AROM to [facility] without pain provocation    Status  On-going      PT LONG TERM GOAL #4   Title  Patient to demonstrate improved tissue quality and pliability with reduced pain    Status  On-going      PT LONG TERM GOAL #5   Title  Patient will demonstrate improved B shoulder strength to >/= 4+/5 for functional UE use    Status  On-going      PT LONG TERM GOAL #6   Title  Patient will demonstrate improved  B LE strength to grossly >/= 4+/5 for improved stability and ease of mobility    Status  On-going      PT LONG TERM GOAL #7   Title  Patient to report ability to perform ADLs, household and work-related tasks without increased pain    Status  On-going            Plan - 12/10/19 1755    Clinical Impression Statement  Pt reports no significant pain in HS today, but some R hip discomfort. She preferred to focus on her shoulders and neck today with reported relief from "movement". Well tolerated session with good form noted with machine rows. Education provided for home performance of thoracic rotation and extension, as well as standing posture throughout the day to combat her prolonged seated periods for work. Pt wil continue to benefit from movement based program.    Personal Factors and Comorbidities  Comorbidity 3+;Time since onset of injury/illness/exacerbation;Fitness;Past/Current Experience;Profession;Age    Comorbidities  chronic B shoulder pain, L HS strain, R knee pain, fibromyaliga, DM, ADHD, L plantar fasciitis    Examination-Activity Limitations  Bathing;Caring for Others;Carry;Dressing;Hygiene/Grooming;Lift;Locomotion Level;Reach Overhead;Sit;Sleep;Squat;Stairs;Stand;Transfers    Examination-Participation Restrictions  Cleaning;Community Activity;Driving;Laundry;Meal Prep;Shop;Volunteer    Rehab Potential  Good    PT Frequency  2x / week    PT Duration  8 weeks    PT Treatment/Interventions  ADLs/Self Care Home Management;Cryotherapy;Electrical Stimulation;Iontophoresis 4mg /ml Dexamethasone;Moist Heat;Traction;Ultrasound;Gait training;Functional mobility training;Therapeutic activities;Therapeutic exercise;Balance training;Neuromuscular re-education;Patient/family education;Manual techniques;Passive range of motion;Dry needling;Energy conservation;Taping;Vasopneumatic Device;Spinal Manipulations;Joint Manipulations    PT Next Visit Plan  address B shoulder, L HS and R knee pain per  patient's preference (area or current/greatest concern); consider HEP update with band-resisted scapular strengthening    PT Home Exercise Plan  11/25/19 - supine and seated KTOS stretch, supine HS stretch with strap, seated UT, LS stretch, doorway pec stretch (mid), hooklying red TB clam shell; 12/03/19 - seated alternative for HS stretch +  medial/lateral isolation, seated and standing red TB HS curls    Consulted and Agree with Plan of Care  Patient       Patient will benefit from skilled therapeutic intervention in order to improve the following deficits and impairments:  Decreased activity tolerance, Decreased endurance, Decreased knowledge of precautions, Decreased mobility, Decreased range of motion, Decreased safety awareness, Decreased strength, Difficulty walking, Increased fascial restricitons, Increased muscle spasms, Impaired perceived functional ability, Impaired flexibility, Impaired UE functional use, Improper body mechanics, Postural dysfunction, Pain  Visit Diagnosis: Chronic left shoulder pain  Muscle weakness (generalized)  Other symptoms and signs involving the musculoskeletal system  Chronic right shoulder pain  Pain in left thigh  Difficulty in walking, not elsewhere classified  Acute pain of right knee     Problem List Patient Active Problem List   Diagnosis Date Noted  . Chronic obstructive pulmonary disease ([place]) 11/23/2019  . Strain of left hamstring 08/05/2019  . Chronic pain of both shoulders 07/10/2019  . Plantar fasciitis of left foot 04/28/2019  . Excessive daytime sleepiness 04/28/2019  . Snores 04/28/2019  . Vitamin D deficiency 04/28/2019  . Pain of left calf 11/18/2017  . Attention deficit hyperactivity disorder (ADHD), predominantly hyperactive type 07/25/2017  . Essential hypertension 07/25/2017  . Controlled type 2 diabetes mellitus without complication, without long-term current use of insulin ([place]) 07/25/2017  . Elevated cholesterol  07/25/2017  . Encounter for hepatitis C screening test for low risk patient 07/25/2017  . Tobacco use 07/25/2017  . Health maintenance alteration 07/25/2017  . Health care maintenance 07/25/2017  . Need for 23-polyvalent pneumococcal polysaccharide vaccine 07/25/2017  . Multiple joint pain 07/25/2017    Izell Maroa, PT, DPT 12/10/2019, 5:58 PM  [facility] [address]  [place], [place], [address] Phone: [phone]   Fax:  [phone]  Name: Paula Kelly MRN: [id number] Date of Birth: Sep 08, 1955

## 2019-12-15 ENCOUNTER — Ambulatory Visit: Payer: Federal, State, Local not specified - PPO

## 2019-12-15 ENCOUNTER — Other Ambulatory Visit: Payer: Self-pay

## 2019-12-15 DIAGNOSIS — M6281 Muscle weakness (generalized): Secondary | ICD-10-CM | POA: Diagnosis not present

## 2019-12-15 DIAGNOSIS — R29898 Other symptoms and signs involving the musculoskeletal system: Secondary | ICD-10-CM | POA: Diagnosis not present

## 2019-12-15 DIAGNOSIS — R262 Difficulty in walking, not elsewhere classified: Secondary | ICD-10-CM | POA: Diagnosis not present

## 2019-12-15 DIAGNOSIS — G8929 Other chronic pain: Secondary | ICD-10-CM | POA: Diagnosis not present

## 2019-12-15 DIAGNOSIS — M25511 Pain in right shoulder: Secondary | ICD-10-CM | POA: Diagnosis not present

## 2019-12-15 DIAGNOSIS — M25512 Pain in left shoulder: Secondary | ICD-10-CM

## 2019-12-15 DIAGNOSIS — M79652 Pain in left thigh: Secondary | ICD-10-CM | POA: Diagnosis not present

## 2019-12-15 DIAGNOSIS — M25561 Pain in right knee: Secondary | ICD-10-CM | POA: Diagnosis not present

## 2019-12-15 NOTE — Therapy (Signed)
[facility] [address]  [place], [place], [address] Phone: [phone]   Fax:  [phone]  Physical Therapy Treatment  Patient Details  Name: Paula Kelly MRN: [id number] Date of Birth: May 26, 1956 Referring Provider (PT): Clearance Coots, MD   Encounter Date: 12/15/2019  PT End of Session - 12/15/19 1728    Visit Number  6    Number of Visits  16    Date for PT Re-Evaluation  01/14/20    Authorization Type  Federal [employer]    Authorization - Number of Visits  --   VL = 75   PT Start Time  1702    PT Stop Time  1745    PT Time Calculation (min)  43 min    Activity Tolerance  Patient tolerated treatment well    Behavior During Therapy  [facility] for tasks assessed/performed       Past Medical History:  Diagnosis Date  . Allergy    seasonal  . Arthritis   . Cataract   . Depression   . Diabetes mellitus without complication ([place])   . H/O fibromyalgia    had for over 10 years  . Hyperlipidemia   . Hypertension   . Post-operative nausea and vomiting     Past Surgical History:  Procedure Laterality Date  . APPENDECTOMY    . COLONOSCOPY    . kidney stone removal  2013   used a basket extraction    There were no vitals filed for this visit.  Subjective Assessment - 12/15/19 1705    Subjective  Pt reports her shoulders felt better after her last visit, and she "loves the open book thing". She has a little mid back pain today from writing    Patient Stated Goals  "to be pain free ("I don't think that will ever happen again") and be able to do more when I retire"    Currently in Pain?  Yes    Pain Score  2     Pain Location  Back    Pain Orientation  Mid                       [facility] Adult PT Treatment/Exercise - 12/15/19 0001      Knee/Hip Exercises: Aerobic   Nustep  L6 x 6 min   end of session     Shoulder Exercises: Supine   Other Supine Exercises  extension over green physioball  with knees bent x 30 seconds      Shoulder Exercises: Seated   Elevation Limitations  LAT PD 35# 1 X 15    Row  Strengthening;Both;15 reps;Weights    Row Weight (lbs)  20# 1st set, 25# 2nd set    Other Seated Exercises  thoracic extension over chair x 15 HBH    Other Seated Exercises  3 way child's pose on green physioball x 2 reps each      Shoulder Exercises: Standing   Protraction Limitations  --      Manual Therapy   Manual Therapy  Joint mobilization    Joint Mobilization  grade III-IV thoracic CPAs and lumbar gapping mobilization in B S/L               PT Short Term Goals - 12/03/19 1623      PT SHORT TERM GOAL #1   Title  Patient will be independent with initial HEP    Status  Achieved  12/01/19       PT Long Term Goals - 11/25/19 1709      PT LONG TERM GOAL #1   Title  Patient will be independent with ongoing/advanced HEP    Status  On-going      PT LONG TERM GOAL #2   Title  Patient will verbalize/demonstrate good awareness of neutral spine posture and proper body mechanics for daily tasks    Status  On-going      PT LONG TERM GOAL #3   Title  Patient to improve B shoulder AROM to [facility] without pain provocation    Status  On-going      PT LONG TERM GOAL #4   Title  Patient to demonstrate improved tissue quality and pliability with reduced pain    Status  On-going      PT LONG TERM GOAL #5   Title  Patient will demonstrate improved B shoulder strength to >/= 4+/5 for functional UE use    Status  On-going      PT LONG TERM GOAL #6   Title  Patient will demonstrate improved B LE strength to grossly >/= 4+/5 for improved stability and ease of mobility    Status  On-going      PT LONG TERM GOAL #7   Title  Patient to report ability to perform ADLs, household and work-related tasks without increased pain    Status  On-going            Plan - 12/15/19 1729    Clinical Impression Statement  Pt presents with little to no pain today with  excellent tolerance of PT session, focused on spinal mobility and posture. Educated to perform HEP throughout the day for maintenance and to go to the [employer] to start her exercise program for longterm maintenance.    Personal Factors and Comorbidities  Comorbidity 3+;Time since onset of injury/illness/exacerbation;Fitness;Past/Current Experience;Profession;Age    Comorbidities  chronic B shoulder pain, L HS strain, R knee pain, fibromyaliga, DM, ADHD, L plantar fasciitis    Examination-Activity Limitations  Bathing;Caring for Others;Carry;Dressing;Hygiene/Grooming;Lift;Locomotion Level;Reach Overhead;Sit;Sleep;Squat;Stairs;Stand;Transfers    Examination-Participation Restrictions  Cleaning;Community Activity;Driving;Laundry;Meal Prep;Shop;Volunteer    Rehab Potential  Good    PT Frequency  2x / week    PT Duration  8 weeks    PT Treatment/Interventions  ADLs/Self Care Home Management;Cryotherapy;Electrical Stimulation;Iontophoresis 4mg /ml Dexamethasone;Moist Heat;Traction;Ultrasound;Gait training;Functional mobility training;Therapeutic activities;Therapeutic exercise;Balance training;Neuromuscular re-education;Patient/family education;Manual techniques;Passive range of motion;Dry needling;Energy conservation;Taping;Vasopneumatic Device;Spinal Manipulations;Joint Manipulations    PT Next Visit Plan  address B shoulder, L HS and R knee pain per patient's preference (area or current/greatest concern); consider HEP update with band-resisted scapular strengthening    PT Home Exercise Plan  11/25/19 - supine and seated KTOS stretch, supine HS stretch with strap, seated UT, LS stretch, doorway pec stretch (mid), hooklying red TB clam shell; 12/03/19 - seated alternative for HS stretch + medial/lateral isolation, seated and standing red TB HS curls    Consulted and Agree with Plan of Care  Patient       Patient will benefit from skilled therapeutic intervention in order to improve the following deficits and  impairments:  Decreased activity tolerance, Decreased endurance, Decreased knowledge of precautions, Decreased mobility, Decreased range of motion, Decreased safety awareness, Decreased strength, Difficulty walking, Increased fascial restricitons, Increased muscle spasms, Impaired perceived functional ability, Impaired flexibility, Impaired UE functional use, Improper body mechanics, Postural dysfunction, Pain  Visit Diagnosis: Chronic left shoulder pain  Pain in left thigh  Difficulty in walking, not  elsewhere classified  Muscle weakness (generalized)  Other symptoms and signs involving the musculoskeletal system  Acute pain of right knee  Chronic right shoulder pain     Problem List Patient Active Problem List   Diagnosis Date Noted  . Chronic obstructive pulmonary disease ([place]) 11/23/2019  . Strain of left hamstring 08/05/2019  . Chronic pain of both shoulders 07/10/2019  . Plantar fasciitis of left foot 04/28/2019  . Excessive daytime sleepiness 04/28/2019  . Snores 04/28/2019  . Vitamin D deficiency 04/28/2019  . Pain of left calf 11/18/2017  . Attention deficit hyperactivity disorder (ADHD), predominantly hyperactive type 07/25/2017  . Essential hypertension 07/25/2017  . Controlled type 2 diabetes mellitus without complication, without long-term current use of insulin ([place]) 07/25/2017  . Elevated cholesterol 07/25/2017  . Encounter for hepatitis C screening test for low risk patient 07/25/2017  . Tobacco use 07/25/2017  . Health maintenance alteration 07/25/2017  . Health care maintenance 07/25/2017  . Need for 23-polyvalent pneumococcal polysaccharide vaccine 07/25/2017  . Multiple joint pain 07/25/2017    Izell Schoolcraft, PT, DPT 12/15/2019, 5:44 PM  [facility] [address]  [place], [place], [address] Phone: [phone]   Fax:  [phone]  Name: Paula Kelly MRN: [id number] Date of  Birth: [birth year]

## 2019-12-17 DIAGNOSIS — K080 Exfoliation of teeth due to systemic causes: Secondary | ICD-10-CM | POA: Diagnosis not present

## 2019-12-24 ENCOUNTER — Other Ambulatory Visit: Payer: Self-pay

## 2019-12-24 ENCOUNTER — Ambulatory Visit: Payer: Federal, State, Local not specified - PPO | Attending: Family Medicine | Admitting: Physical Therapy

## 2019-12-24 ENCOUNTER — Encounter: Payer: Self-pay | Admitting: Physical Therapy

## 2019-12-24 DIAGNOSIS — M79652 Pain in left thigh: Secondary | ICD-10-CM

## 2019-12-24 DIAGNOSIS — M25561 Pain in right knee: Secondary | ICD-10-CM | POA: Insufficient documentation

## 2019-12-24 DIAGNOSIS — G8929 Other chronic pain: Secondary | ICD-10-CM | POA: Diagnosis not present

## 2019-12-24 DIAGNOSIS — M25511 Pain in right shoulder: Secondary | ICD-10-CM | POA: Diagnosis not present

## 2019-12-24 DIAGNOSIS — R262 Difficulty in walking, not elsewhere classified: Secondary | ICD-10-CM

## 2019-12-24 DIAGNOSIS — R29898 Other symptoms and signs involving the musculoskeletal system: Secondary | ICD-10-CM | POA: Insufficient documentation

## 2019-12-24 DIAGNOSIS — M25512 Pain in left shoulder: Secondary | ICD-10-CM | POA: Diagnosis not present

## 2019-12-24 DIAGNOSIS — M6281 Muscle weakness (generalized): Secondary | ICD-10-CM

## 2019-12-24 NOTE — Patient Instructions (Signed)
    Home exercise program created by Shizue Kaseman, PT.  For questions, please contact Frieda Arnall via phone at [phone] or email at [email or web address]  [facility] [address]  [address] [place], [place], [address] Phone: [phone]   Fax:  [phone]    

## 2019-12-24 NOTE — Therapy (Signed)
[facility] [address]  [place], [place], [address] Phone: [phone]   Fax:  [phone]  Physical Therapy Treatment  Patient Details  Name: Paula Kelly MRN: [id number] Date of Birth: 02-24-1956 Referring Provider (PT): Clearance Coots, MD   Encounter Date: 12/24/2019  PT End of Session - 12/24/19 1708    Visit Number  7    Number of Visits  16    Date for PT Re-Evaluation  01/14/20    Authorization Type  Federal [employer]    Authorization - Number of Visits  --   VL = 75   PT Start Time  1708    PT Stop Time  1746    PT Time Calculation (min)  38 min    Activity Tolerance  Patient tolerated treatment well    Behavior During Therapy  [facility] for tasks assessed/performed       Past Medical History:  Diagnosis Date  . Allergy    seasonal  . Arthritis   . Cataract   . Depression   . Diabetes mellitus without complication ([place])   . H/O fibromyalgia    had for over 10 years  . Hyperlipidemia   . Hypertension   . Post-operative nausea and vomiting     Past Surgical History:  Procedure Laterality Date  . APPENDECTOMY    . COLONOSCOPY    . kidney stone removal  2013   used a basket extraction    There were no vitals filed for this visit.  Subjective Assessment - 12/24/19 1711    Subjective  Pt reports "the massge to my hamstrings was remarkable" - now only having pain rarely when climbing stairs or if she sits too long. Biggest ongoing concern is UE pain.    Patient Stated Goals  "to be pain free ("I don't think that will ever happen again") and be able to do more when I retire"    Currently in Pain?  Yes    Pain Score  2     Pain Location  Back    Pain Orientation  Mid    Pain Descriptors / Indicators  Sore;Aching    Pain Type  Chronic pain    Pain Frequency  Intermittent    Pain Score  3   chronic 2.5/10 spiking to 5/10   Pain Location  Shoulder   & upper arms   Pain Orientation  Right;Left     Pain Descriptors / Indicators  Aching;Sharp    Pain Type  Chronic pain    Pain Frequency  Constant    Pain Score  0    Pain Location  Knee                       [facility] Adult PT Treatment/Exercise - 12/24/19 1708      Knee/Hip Exercises: Aerobic   Nustep  L6 x 6 min (UE/LE)   seat #6     Shoulder Exercises: Supine   Horizontal ABduction  Both;10 reps;Strengthening;Theraband    Theraband Level (Shoulder Horizontal ABduction)  Level 2 (Red)    Horizontal ABduction Limitations  hoolying on pool noodle - cues for scap retraction into noodle    External Rotation  Both;10 reps;Strengthening;Theraband    Theraband Level (Shoulder External Rotation)  Level 2 (Red)    External Rotation Limitations  hoolying on pool noodle - cues for scap retraction into noodle    Diagonals  Right;Left;10 reps;Strengthening;Theraband  Theraband Level (Shoulder Diagonals)  Level 2 (Red)    Diagonals Limitations  hoolying on pool noodle - cues for scap retraction into noodle      Shoulder Exercises: Sidelying   Other Sidelying Exercises  B open books x 10 each   cues for head to follow hand to reduce neck strain     Shoulder Exercises: Standing   Extension  Both;10 reps;Strengthening;Theraband    Theraband Level (Shoulder Extension)  Level 3 (Green)    Extension Limitations  cues for abd bracing and scapular retraction & depression    Row  Both;10 reps;Strengthening;Theraband    Theraband Level (Shoulder Row)  Level 3 (Green)    Row Limitations  cues for abd bracing and scapular retraction      Shoulder Exercises: IT sales professional  30 seconds;3 reps    Corner Stretch Limitations  3-way doorway stretch             PT Education - 12/24/19 1746    Education Details  HEP update - scapular strengtheing: red TB horiz ABD, ER & diagonals hooklying on FR, green TB row & shoulder extension    Person(s) Educated  Patient    Methods  Explanation;Demonstration;Handout     Comprehension  Verbalized understanding;Returned demonstration;Need further instruction       PT Short Term Goals - 12/03/19 1623      PT SHORT TERM GOAL #1   Title  Patient will be independent with initial HEP    Status  Achieved   12/01/19       PT Long Term Goals - 12/24/19 1746      PT LONG TERM GOAL #1   Title  Patient will be independent with ongoing/advanced HEP    Status  Partially Met   12/24/19 - met for current HEP     PT LONG TERM GOAL #2   Title  Patient will verbalize/demonstrate good awareness of neutral spine posture and proper body mechanics for daily tasks    Status  On-going      PT LONG TERM GOAL #3   Title  Patient to improve B shoulder AROM to [facility] without pain provocation    Status  On-going      PT LONG TERM GOAL #4   Title  Patient to demonstrate improved tissue quality and pliability with reduced pain    Status  Partially Met   12/24/19 - met for HS     PT LONG TERM GOAL #5   Title  Patient will demonstrate improved B shoulder strength to >/= 4+/5 for functional UE use    Status  On-going      PT LONG TERM GOAL #6   Title  Patient will demonstrate improved B LE strength to grossly >/= 4+/5 for improved stability and ease of mobility    Status  On-going      PT LONG TERM GOAL #7   Title  Patient to report ability to perform ADLs, household and work-related tasks without increased pain    Status  On-going            Plan - 12/24/19 1746    Clinical Impression Statement  Paula Kelly reports STM to hamstring a few visits ago has mostly resolved HS pain, only noting rare discomfort with climbing stairs or if she sits too long. Primary ongoing concern is B shoulder/upper arm pain, hence therapy session focusing on this per pt's preference. Progressed scapular strengthening and stabilization with theraband resisted exercises in hooklying  over pool noodle/foam roll as well as scapular retraction with core/lumbopelvic stabilization with standing  rows/shoulder extensions. All exercises well tolerated with pt noting UE diagonals feeling like they are really targeting where her L shoulder hurts, therefore HEP updated accordingly.    Personal Factors and Comorbidities  Comorbidity 3+;Time since onset of injury/illness/exacerbation;Fitness;Past/Current Experience;Profession;Age    Comorbidities  chronic B shoulder pain, L HS strain, R knee pain, fibromyaliga, DM, ADHD, L plantar fasciitis    Examination-Activity Limitations  Bathing;Caring for Others;Carry;Dressing;Hygiene/Grooming;Lift;Locomotion Level;Reach Overhead;Sit;Sleep;Squat;Stairs;Stand;Transfers    Examination-Participation Restrictions  Cleaning;Community Activity;Driving;Laundry;Meal Prep;Shop;Volunteer    Rehab Potential  Good    PT Frequency  2x / week    PT Duration  8 weeks    PT Treatment/Interventions  ADLs/Self Care Home Management;Cryotherapy;Electrical Stimulation;Iontophoresis 47m/ml Dexamethasone;Moist Heat;Traction;Ultrasound;Gait training;Functional mobility training;Therapeutic activities;Therapeutic exercise;Balance training;Neuromuscular re-education;Patient/family education;Manual techniques;Passive range of motion;Dry needling;Energy conservation;Taping;Vasopneumatic Device;Spinal Manipulations;Joint Manipulations    PT Next Visit Plan  address B shoulder, L HS and R knee pain per patient's preference (area or current/greatest concern)    PT Home Exercise Plan  11/25/19 - supine and seated KTOS stretch, supine HS stretch with strap, seated UT, LS stretch, doorway pec stretch (mid), hooklying red TB clam shell; 12/03/19 - seated alternative for HS stretch + medial/lateral isolation, seated and standing red TB HS curls; 12/24/19 - scapular strengtheing: red TB horiz ABD, ER & diagonals hooklying on FR, green TB row & shoulder extension    Consulted and Agree with Plan of Care  Patient       Patient will benefit from skilled therapeutic intervention in order to improve the  following deficits and impairments:  Decreased activity tolerance, Decreased endurance, Decreased knowledge of precautions, Decreased mobility, Decreased range of motion, Decreased safety awareness, Decreased strength, Difficulty walking, Increased fascial restricitons, Increased muscle spasms, Impaired perceived functional ability, Impaired flexibility, Impaired UE functional use, Improper body mechanics, Postural dysfunction, Pain  Visit Diagnosis: Chronic left shoulder pain  Chronic right shoulder pain  Pain in left thigh  Acute pain of right knee  Other symptoms and signs involving the musculoskeletal system  Muscle weakness (generalized)  Difficulty in walking, not elsewhere classified     Problem List Patient Active Problem List   Diagnosis Date Noted  . Chronic obstructive pulmonary disease ([place] 11/23/2019  . Strain of left hamstring 08/05/2019  . Chronic pain of both shoulders 07/10/2019  . Plantar fasciitis of left foot 04/28/2019  . Excessive daytime sleepiness 04/28/2019  . Snores 04/28/2019  . Vitamin D deficiency 04/28/2019  . Pain of left calf 11/18/2017  . Attention deficit hyperactivity disorder (ADHD), predominantly hyperactive type 07/25/2017  . Essential hypertension 07/25/2017  . Controlled type 2 diabetes mellitus without complication, without long-term current use of insulin ([place] 07/25/2017  . Elevated cholesterol 07/25/2017  . Encounter for hepatitis C screening test for low risk patient 07/25/2017  . Tobacco use 07/25/2017  . Health maintenance alteration 07/25/2017  . Health care maintenance 07/25/2017  . Need for 23-polyvalent pneumococcal polysaccharide vaccine 07/25/2017  . Multiple joint pain 07/25/2017    JPercival Spanish PT, MPT 12/24/2019, 6:13 PM  [facility] [place] [address]Phone: [phone]  Fax:  [phone] Name: Paula RATTIMRN:  [id number] of Birth: [birth year]

## 2019-12-28 ENCOUNTER — Ambulatory Visit: Payer: Federal, State, Local not specified - PPO

## 2019-12-29 DIAGNOSIS — G4733 Obstructive sleep apnea (adult) (pediatric): Secondary | ICD-10-CM | POA: Diagnosis not present

## 2019-12-30 DIAGNOSIS — Z79899 Other long term (current) drug therapy: Secondary | ICD-10-CM | POA: Diagnosis not present

## 2019-12-30 DIAGNOSIS — F900 Attention-deficit hyperactivity disorder, predominantly inattentive type: Secondary | ICD-10-CM | POA: Diagnosis not present

## 2019-12-30 DIAGNOSIS — F330 Major depressive disorder, recurrent, mild: Secondary | ICD-10-CM | POA: Diagnosis not present

## 2019-12-31 ENCOUNTER — Other Ambulatory Visit: Payer: Self-pay

## 2019-12-31 ENCOUNTER — Ambulatory Visit: Payer: Federal, State, Local not specified - PPO | Admitting: Physical Therapy

## 2019-12-31 DIAGNOSIS — R29898 Other symptoms and signs involving the musculoskeletal system: Secondary | ICD-10-CM

## 2019-12-31 DIAGNOSIS — M6281 Muscle weakness (generalized): Secondary | ICD-10-CM | POA: Diagnosis not present

## 2019-12-31 DIAGNOSIS — M25512 Pain in left shoulder: Secondary | ICD-10-CM

## 2019-12-31 DIAGNOSIS — G8929 Other chronic pain: Secondary | ICD-10-CM

## 2019-12-31 DIAGNOSIS — M79652 Pain in left thigh: Secondary | ICD-10-CM | POA: Diagnosis not present

## 2019-12-31 DIAGNOSIS — R262 Difficulty in walking, not elsewhere classified: Secondary | ICD-10-CM | POA: Diagnosis not present

## 2019-12-31 DIAGNOSIS — M25511 Pain in right shoulder: Secondary | ICD-10-CM | POA: Diagnosis not present

## 2019-12-31 DIAGNOSIS — M25561 Pain in right knee: Secondary | ICD-10-CM | POA: Diagnosis not present

## 2019-12-31 NOTE — Therapy (Addendum)
[facility] [address]  [place], [place], [address] Phone: [phone]   Fax:  [phone]  Physical Therapy Treatment / Discharge Summary  Patient Details  Name: Paula Kelly MRN: [id number] Date of Birth: Dec 05, 1955 Referring Provider (PT): Clearance Coots, MD  Progress Note  Reporting Period 11/19/2019 to 12/31/2019  See note below for Objective Data and Assessment of Progress/Goals.     Encounter Date: 12/31/2019  PT End of Session - 12/31/19 1710    Visit Number  8    Number of Visits  16    Date for PT Re-Evaluation  01/14/20    Authorization Type  Federal BCBS    Authorization - Number of Visits  --   VL = 75   Progress Note Due on Visit  43   MD PN on visit #8 for f/u office appt 01/04/20   PT Start Time  1710    PT Stop Time  1752    PT Time Calculation (min)  42 min    Activity Tolerance  Patient tolerated treatment well    Behavior During Therapy  WFL for tasks assessed/performed       Past Medical History:  Diagnosis Date  . Allergy    seasonal  . Arthritis   . Cataract   . Depression   . Diabetes mellitus without complication ([place])   . H/O fibromyalgia    had for over 10 years  . Hyperlipidemia   . Hypertension   . Post-operative nausea and vomiting     Past Surgical History:  Procedure Laterality Date  . APPENDECTOMY    . COLONOSCOPY    . kidney stone removal  2013   used a basket extraction    There were no vitals filed for this visit.  Subjective Assessment - 12/31/19 1713    Subjective  Pt reports her back has been in spasm due to work today, then wentto get her taxes done and had to sit on a hard chair which fared up her HS. Arms/shoulders doing ok today. Denies recent knee pain.    Patient Stated Goals  "to be pain free ("I don't think that will ever happen again") and be able to do more when I retire"    Currently in Pain?  Yes    Pain Score  4    3-4/10   Pain  Location  Back    Pain Orientation  Mid    Pain Descriptors / Indicators  Burning;Constant    Pain Type  Chronic pain    Pain Frequency  Intermittent    Pain Score  0    Pain Location  Shoulder    Pain Orientation  Right;Left    Pain Score  0   3.5-4/10 while sitting on hard chair   Pain Location  Leg   proximal HS   Pain Orientation  Left    Pain Descriptors / Indicators  Sore    Pain Type  Acute pain    Pain Frequency  Intermittent         [facility] PT Assessment - 12/31/19 1710      Assessment   Medical Diagnosis  B shoulder pain; L HS strain, R knee pain    Referring Provider (PT)  Clearance Coots, MD    Next MD Visit  01/04/20      AROM   Right Shoulder Flexion  158 Degrees    Right Shoulder ABduction  159 Degrees  Right Shoulder Internal Rotation  --   FIR to T11   Right Shoulder External Rotation  --   FER to T2   Left Shoulder Flexion  160 Degrees    Left Shoulder ABduction  154 Degrees    Left Shoulder Internal Rotation  --   FIR to T11   Left Shoulder External Rotation  --   FER to T2   Cervical Flexion  46    Cervical Extension  50    Cervical - Right Side Bend  30    Cervical - Left Side Bend  27    Cervical - Right Rotation  68    Cervical - Left Rotation  60      Strength   Right Shoulder Flexion  4+/5    Right Shoulder ABduction  4+/5    Right Shoulder Internal Rotation  4+/5    Right Shoulder External Rotation  4+/5    Left Shoulder Flexion  4+/5    Left Shoulder ABduction  4+/5    Left Shoulder Internal Rotation  4+/5    Left Shoulder External Rotation  4+/5    Right Hip Flexion  4+/5    Right Hip Extension  4+/5    Right Hip External Rotation   4/5    Right Hip Internal Rotation  4+/5    Right Hip ABduction  4/5    Right Hip ADduction  4+/5    Left Hip Flexion  4+/5    Left Hip Extension  4+/5    Left Hip External Rotation  4/5    Left Hip Internal Rotation  4+/5    Left Hip ABduction  4/5    Left Hip ADduction  4+/5    Right Knee  Flexion  4+/5    Right Knee Extension  4+/5    Left Knee Flexion  4+/5    Left Knee Extension  4+/5                    [facility] Adult PT Treatment/Exercise - 12/31/19 1710      Self-Care   Self-Care  Posture    Posture  Reviewed ideal workstation set-up and desk posture offering suggestions to modify her current set-up as she feels her chair is too big to allow her to sti properly.       Lumbar Exercises: Stretches   Prone on Elbows Stretch  30 seconds;2 reps    Press Ups  3 reps;5 seconds      Knee/Hip Exercises: Aerobic   Nustep  L6 x 6 min (UE/LE)   seat #6              PT Short Term Goals - 12/03/19 1623      PT SHORT TERM GOAL #1   Title  Patient will be independent with initial HEP    Status  Achieved   12/01/19       PT Long Term Goals - 12/31/19 1720      PT LONG TERM GOAL #1   Title  Patient will be independent with ongoing/advanced HEP    Status  Partially Met   12/31/19 - met for current HEP   Target Date  01/14/20      PT LONG TERM GOAL #2   Title  Patient will verbalize/demonstrate good awareness of neutral spine posture and proper body mechanics for daily tasks    Status  Partially Met   12/31/19 - aware of desired proper posture but finds herself having  to correct her posture "almost constantly"   Target Date  01/14/20      PT LONG TERM GOAL #3   Title  Patient to improve B shoulder AROM to [facility] without pain provocation    Status  Achieved   12/31/19     PT LONG TERM GOAL #4   Title  Patient to demonstrate improved tissue quality and pliability with reduced pain    Status  Partially Met   12/31/19 - continues to note muscle strain/tightness and associated pain intermittently in HS, mid back and shoulders   Target Date  01/14/20      PT LONG TERM GOAL #5   Title  Patient will demonstrate improved B shoulder strength to >/= 4+/5 for functional UE use    Status  Achieved   12/31/19     PT LONG TERM GOAL #6   Title  Patient will  demonstrate improved B LE strength to grossly >/= 4+/5 for improved stability and ease of mobility    Status  Partially Met   12/31/19 - met except B hip abduction & ER   Target Date  01/14/20      PT LONG TERM GOAL #7   Title  Patient to report ability to perform ADLs, household and work-related tasks without increased pain    Status  On-going    Target Date  01/14/20            Plan - 12/31/19 1752    Clinical Impression Statement  [name] reports 70% improvement with PT thus far, noting knee pain seems to have resolved and shoulder ROM has improved allowing her to don her bra without having to spin it around. She still notes frequent need to self-correct her posture while typing at her computer, typically when back and shoulder pain reminds her, but postural correction will usually resolve/reduce the pain. B shoulder ROM now [facility] and strength grossly 4+/5 with remaining deficits related to posture related muscle strain. B LE strength also improving with greatest weakness in hip abduction and ER. [name] is progressing well toward her goals with STG and LTGs #3 & 5 met and remaining goals partially met with only exception being LTG #7 ongoing.  [name] will continue to benefit from skilled PT to promote improved postural awareness and stability, increase strength and reduce pain and abnormal muscle tension to facilitate ability to get through her workday and household activities w/o pain interference.    Personal Factors and Comorbidities  Comorbidity 3+;Time since onset of injury/illness/exacerbation;Fitness;Past/Current Experience;Profession;Age    Comorbidities  chronic B shoulder pain, L HS strain, R knee pain, fibromyaliga, DM, ADHD, L plantar fasciitis    Examination-Activity Limitations  Bathing;Caring for Others;Carry;Dressing;Hygiene/Grooming;Lift;Locomotion Level;Reach Overhead;Sit;Sleep;Squat;Stairs;Stand;Transfers    Examination-Participation Restrictions  Cleaning;Community  Activity;Driving;Laundry;Meal Prep;Shop;Volunteer    Rehab Potential  Good    PT Frequency  2x / week    PT Duration  8 weeks    PT Treatment/Interventions  ADLs/Self Care Home Management;Cryotherapy;Electrical Stimulation;Iontophoresis 81m/ml Dexamethasone;Moist Heat;Traction;Ultrasound;Gait training;Functional mobility training;Therapeutic activities;Therapeutic exercise;Balance training;Neuromuscular re-education;Patient/family education;Manual techniques;Passive range of motion;Dry needling;Energy conservation;Taping;Vasopneumatic Device;Spinal Manipulations;Joint Manipulations    PT Next Visit Plan  address B shoulder, L HS and R knee pain per patient's preference (area or current/greatest concern)    PT Home Exercise Plan  11/25/19 - supine and seated KTOS stretch, supine HS stretch with strap, seated UT, LS stretch, doorway pec stretch (mid), hooklying red TB clam shell; 12/03/19 - seated alternative for HS stretch + medial/lateral isolation, seated and standing red  TB HS curls; 12/24/19 - scapular strengtheing: red TB horiz ABD, ER & diagonals hooklying on FR, green TB row & shoulder extension    Consulted and Agree with Plan of Care  Patient       Patient will benefit from skilled therapeutic intervention in order to improve the following deficits and impairments:  Decreased activity tolerance, Decreased endurance, Decreased knowledge of precautions, Decreased mobility, Decreased range of motion, Decreased safety awareness, Decreased strength, Difficulty walking, Increased fascial restricitons, Increased muscle spasms, Impaired perceived functional ability, Impaired flexibility, Impaired UE functional use, Improper body mechanics, Postural dysfunction, Pain  Visit Diagnosis: Chronic left shoulder pain  Chronic right shoulder pain  Pain in left thigh  Acute pain of right knee  Other symptoms and signs involving the musculoskeletal system  Muscle weakness (generalized)  Difficulty in  walking, not elsewhere classified     Problem List Patient Active Problem List   Diagnosis Date Noted  . Chronic obstructive pulmonary disease ([place]) 11/23/2019  . Strain of left hamstring 08/05/2019  . Chronic pain of both shoulders 07/10/2019  . Plantar fasciitis of left foot 04/28/2019  . Excessive daytime sleepiness 04/28/2019  . Snores 04/28/2019  . Vitamin D deficiency 04/28/2019  . Pain of left calf 11/18/2017  . Attention deficit hyperactivity disorder (ADHD), predominantly hyperactive type 07/25/2017  . Essential hypertension 07/25/2017  . Controlled type 2 diabetes mellitus without complication, without long-term current use of insulin ([place]) 07/25/2017  . Elevated cholesterol 07/25/2017  . Encounter for hepatitis C screening test for low risk patient 07/25/2017  . Tobacco use 07/25/2017  . Health maintenance alteration 07/25/2017  . Health care maintenance 07/25/2017  . Need for 23-polyvalent pneumococcal polysaccharide vaccine 07/25/2017  . Multiple joint pain 07/25/2017    Percival Spanish, PT, MPT 12/31/2019, 6:21 PM  [facility] [address]  [place], [place], [address] Phone: [phone]   Fax:  [phone]  Name: BETZAIRA MENTEL MRN: [id number] Date of Birth: [birth year]   PHYSICAL THERAPY DISCHARGE SUMMARY  Visits from Start of Care: 8  Current functional level related to goals / functional outcomes:   Refer to above clinical impression for status as of last visit on 12/31/2019. Patient has not returned for any further PT visits in >30 days, therefore will proceed with discharge from PT for this episode.   Remaining deficits:   As above.    Education / Equipment:   HEP  Plan: Patient agrees to discharge.  Patient goals were partially met. Patient is being discharged due to not returning since the last visit.  ?????     Percival Spanish, PT, MPT 03/24/20, 3:22 PM  [facility] [address]  [place], [place], [address] Phone: [phone]   Fax:  [phone]

## 2020-01-01 ENCOUNTER — Ambulatory Visit: Payer: Federal, State, Local not specified - PPO | Admitting: Family Medicine

## 2020-01-04 ENCOUNTER — Ambulatory Visit: Payer: Federal, State, Local not specified - PPO | Admitting: Family Medicine

## 2020-01-04 ENCOUNTER — Other Ambulatory Visit: Payer: Self-pay

## 2020-01-04 ENCOUNTER — Encounter: Payer: Self-pay | Admitting: Family Medicine

## 2020-01-04 DIAGNOSIS — M25512 Pain in left shoulder: Secondary | ICD-10-CM | POA: Diagnosis not present

## 2020-01-04 DIAGNOSIS — M25511 Pain in right shoulder: Secondary | ICD-10-CM

## 2020-01-04 DIAGNOSIS — G8929 Other chronic pain: Secondary | ICD-10-CM

## 2020-01-04 NOTE — Assessment & Plan Note (Signed)
Has had improvement with physical therapy.  The Duexis also seems to improve her symptoms. -Counseled on home exercise therapy and supportive care. -Can use Duexis as needed. -Can follow-up as needed.

## 2020-01-04 NOTE — Patient Instructions (Signed)
Good to see you  Congrats on your retirement.  Please let us know if you need duexis  Please send me a message in MyChart with any questions or updates.  Please see [place] back as needed .   --Dr. Raeford Razor

## 2020-01-04 NOTE — Progress Notes (Signed)
Paula Kelly - 64 y.o. female MRN [id number]  Date of birth: 09-27-1955  SUBJECTIVE:  Including CC & ROS.  Chief Complaint  Patient presents with  . Follow-up    bilateral shoulder / left leg    Paula Kelly is a 64 y.o. female that is following up for bilateral shoulder pain.  She has had improvement with physical therapy.  She does get improvement with the Duexis as well.  It seems to be worse with work.   Review of Systems See HPI   HISTORY: Past Medical, Surgical, Social, and Family History Reviewed & Updated per EMR.   Pertinent Historical Findings include:  Past Medical History:  Diagnosis Date  . Allergy    seasonal  . Arthritis   . Cataract   . Depression   . Diabetes mellitus without complication ([place])   . H/O fibromyalgia    had for over 10 years  . Hyperlipidemia   . Hypertension   . Post-operative nausea and vomiting     Past Surgical History:  Procedure Laterality Date  . APPENDECTOMY    . COLONOSCOPY    . kidney stone removal  2013   used a basket extraction    Family History  Problem Relation Age of Onset  . Diabetes Mother   . Heart disease Father   . Emphysema Father   . CVA Sister   . AAA (abdominal aortic aneurysm) Brother     Social History   Socioeconomic History  . Marital status: Married    Spouse name: Not on file  . Number of children: Not on file  . Years of education: Not on file  . Highest education level: Not on file  Occupational History  . Not on file  Tobacco Use  . Smoking status: Current Every Day Smoker    Packs/day: 1.00    Years: 47.00    Pack years: 47.00    Types: Cigarettes  . Smokeless tobacco: Never Used  Substance and Sexual Activity  . Alcohol use: No  . Drug use: No  . Sexual activity: Not on file  Other Topics Concern  . Not on file  Social History Narrative  . Not on file   Social Determinants of Health   Financial Resource Strain:   . Difficulty of Paying Living Expenses:     Food Insecurity:   . Worried About Charity fundraiser in the Last Year:   . Arboriculturist in the Last Year:   Transportation Needs:   . Film/video editor (Medical):   Marland Kitchen Lack of Transportation (Non-Medical):   Physical Activity:   . Days of Exercise per Week:   . Minutes of Exercise per Session:   Stress:   . Feeling of Stress :   Social Connections:   . Frequency of Communication with Friends and Family:   . Frequency of Social Gatherings with Friends and Family:   . Attends Religious Services:   . Active Member of Clubs or Organizations:   . Attends Archivist Meetings:   Marland Kitchen Marital Status:   Intimate Partner Violence:   . Fear of Current or Ex-Partner:   . Emotionally Abused:   Marland Kitchen Physically Abused:   . Sexually Abused:      PHYSICAL EXAM:  VS: BP (!) 145/85   Ht 5\' 4"  (1.626 m)   Wt 220 lb (99.8 kg)   BMI 37.76 kg/m  Physical Exam Gen: NAD, alert, cooperative with exam, well-appearing MSK:  Right  and left shoulder: Has good range of motion. Good strength. Neurovascularly intact     ASSESSMENT & PLAN:   Chronic pain of both shoulders Has had improvement with physical therapy.  The Duexis also seems to improve her symptoms. -Counseled on home exercise therapy and supportive care. -Can use Duexis as needed. -Can follow-up as needed.

## 2020-01-13 DIAGNOSIS — H04123 Dry eye syndrome of bilateral lacrimal glands: Secondary | ICD-10-CM | POA: Diagnosis not present

## 2020-01-13 DIAGNOSIS — H16223 Keratoconjunctivitis sicca, not specified as Sjogren's, bilateral: Secondary | ICD-10-CM | POA: Diagnosis not present

## 2020-01-13 DIAGNOSIS — H2513 Age-related nuclear cataract, bilateral: Secondary | ICD-10-CM | POA: Diagnosis not present

## 2020-01-13 DIAGNOSIS — H40033 Anatomical narrow angle, bilateral: Secondary | ICD-10-CM | POA: Diagnosis not present

## 2020-01-27 ENCOUNTER — Other Ambulatory Visit: Payer: Self-pay | Admitting: Family Medicine

## 2020-01-27 DIAGNOSIS — I10 Essential (primary) hypertension: Secondary | ICD-10-CM

## 2020-01-27 DIAGNOSIS — E119 Type 2 diabetes mellitus without complications: Secondary | ICD-10-CM

## 2020-01-29 DIAGNOSIS — G4733 Obstructive sleep apnea (adult) (pediatric): Secondary | ICD-10-CM | POA: Diagnosis not present

## 2020-02-13 ENCOUNTER — Other Ambulatory Visit: Payer: Self-pay | Admitting: Family Medicine

## 2020-02-13 DIAGNOSIS — I10 Essential (primary) hypertension: Secondary | ICD-10-CM

## 2020-02-28 DIAGNOSIS — G4733 Obstructive sleep apnea (adult) (pediatric): Secondary | ICD-10-CM | POA: Diagnosis not present

## 2020-03-28 DIAGNOSIS — F329 Major depressive disorder, single episode, unspecified: Secondary | ICD-10-CM | POA: Diagnosis not present

## 2020-03-28 DIAGNOSIS — F909 Attention-deficit hyperactivity disorder, unspecified type: Secondary | ICD-10-CM | POA: Diagnosis not present

## 2020-03-28 DIAGNOSIS — Z79899 Other long term (current) drug therapy: Secondary | ICD-10-CM | POA: Diagnosis not present

## 2020-03-30 DIAGNOSIS — G4733 Obstructive sleep apnea (adult) (pediatric): Secondary | ICD-10-CM | POA: Diagnosis not present

## 2020-04-22 ENCOUNTER — Other Ambulatory Visit: Payer: Self-pay | Admitting: Family Medicine

## 2020-04-22 DIAGNOSIS — E119 Type 2 diabetes mellitus without complications: Secondary | ICD-10-CM

## 2020-05-02 ENCOUNTER — Ambulatory Visit: Payer: Federal, State, Local not specified - PPO | Admitting: Family Medicine

## 2020-05-02 ENCOUNTER — Other Ambulatory Visit: Payer: Self-pay

## 2020-05-02 ENCOUNTER — Encounter: Payer: Self-pay | Admitting: Family Medicine

## 2020-05-02 VITALS — BP 148/72 | HR 113 | Temp 96.8°F | Ht 64.0 in | Wt 220.8 lb

## 2020-05-02 DIAGNOSIS — E119 Type 2 diabetes mellitus without complications: Secondary | ICD-10-CM

## 2020-05-02 DIAGNOSIS — N3941 Urge incontinence: Secondary | ICD-10-CM

## 2020-05-02 DIAGNOSIS — Z720 Tobacco use: Secondary | ICD-10-CM

## 2020-05-02 DIAGNOSIS — E7800 Pure hypercholesterolemia, unspecified: Secondary | ICD-10-CM

## 2020-05-02 DIAGNOSIS — Z23 Encounter for immunization: Secondary | ICD-10-CM | POA: Diagnosis not present

## 2020-05-02 DIAGNOSIS — I10 Essential (primary) hypertension: Secondary | ICD-10-CM | POA: Diagnosis not present

## 2020-05-02 LAB — HEMOGLOBIN A1C: Hgb A1c MFr Bld: 7 % — ABNORMAL HIGH (ref 4.6–6.5)

## 2020-05-02 LAB — MICROALBUMIN / CREATININE URINE RATIO
Creatinine,U: 95 mg/dL
Microalb Creat Ratio: 0.9 mg/g (ref 0.0–30.0)
Microalb, Ur: 0.8 mg/dL (ref 0.0–1.9)

## 2020-05-02 LAB — URINALYSIS, ROUTINE W REFLEX MICROSCOPIC
Bilirubin Urine: NEGATIVE
Hgb urine dipstick: NEGATIVE
Ketones, ur: NEGATIVE
Leukocytes,Ua: NEGATIVE
Nitrite: NEGATIVE
RBC / HPF: NONE SEEN (ref 0–?)
Specific Gravity, Urine: 1.015 (ref 1.000–1.030)
Total Protein, Urine: NEGATIVE
Urine Glucose: NEGATIVE
Urobilinogen, UA: 0.2 (ref 0.0–1.0)
pH: 7 (ref 5.0–8.0)

## 2020-05-02 LAB — COMPREHENSIVE METABOLIC PANEL
ALT: 20 U/L (ref 0–35)
AST: 16 U/L (ref 0–37)
Albumin: 4.8 g/dL (ref 3.5–5.2)
Alkaline Phosphatase: 82 U/L (ref 39–117)
BUN: 11 mg/dL (ref 6–23)
CO2: 28 mEq/L (ref 19–32)
Calcium: 10.3 mg/dL (ref 8.4–10.5)
Chloride: 100 mEq/L (ref 96–112)
Creatinine, Ser: 0.75 mg/dL (ref 0.40–1.20)
GFR: 77.92 mL/min (ref >60.00)
Glucose, Bld: 103 mg/dL — ABNORMAL HIGH (ref 70–99)
Potassium: 4.2 mEq/L (ref 3.5–5.1)
Sodium: 138 mEq/L (ref 135–145)
Total Bilirubin: 0.4 mg/dL (ref 0.2–1.2)
Total Protein: 7 g/dL (ref 6.0–8.3)

## 2020-05-02 LAB — CBC
HCT: 44.8 % (ref 36.0–46.0)
Hemoglobin: 14.9 g/dL (ref 12.0–15.0)
MCHC: 33.3 g/dL (ref 30.0–36.0)
MCV: 90 fl (ref 78.0–100.0)
Platelets: 381 10*3/uL (ref 150.0–400.0)
RBC: 4.97 Mil/uL (ref 3.87–5.11)
RDW: 13.5 % (ref 11.5–15.5)
WBC: 11.2 10*3/uL — ABNORMAL HIGH (ref 4.0–10.5)

## 2020-05-02 LAB — LDL CHOLESTEROL, DIRECT: Direct LDL: 94 mg/dL

## 2020-05-02 NOTE — Patient Instructions (Signed)
Urinary Incontinence  Urinary incontinence refers to a condition in which a person is unable to control where and when to pass urine. A person with this condition will urinate when he or she does not mean to (involuntarily). What are the causes? This condition may be caused by:  Medicines.  Infections.  Constipation.  Overactive bladder muscles.  Weak bladder muscles.  Weak pelvic floor muscles. These muscles provide support for the bladder, intestine, and, in women, the uterus.  Enlarged prostate in men. The prostate is a gland near the bladder. When it gets too big, it can pinch the urethra. With the urethra blocked, the bladder can weaken and lose the ability to empty properly.  Surgery.  Emotional factors, such as anxiety, stress, or post-traumatic stress disorder (PTSD).  Pelvic organ prolapse. This happens in women when organs shift out of place and into the vagina. This shift can prevent the bladder and urethra from working properly. What increases the risk? The following factors may make you more likely to develop this condition:  Older age.  Obesity and physical inactivity.  Pregnancy and childbirth.  Menopause.  Diseases that affect the nerves or spinal cord (neurological diseases).  Long-term (chronic) coughing. This can increase pressure on the bladder and pelvic floor muscles. What are the signs or symptoms? Symptoms may vary depending on the type of urinary incontinence you have. They include:  A sudden urge to urinate, but passing urine involuntarily before you can get to a bathroom (urge incontinence).  Suddenly passing urine with any activity that forces urine to pass, such as coughing, laughing, exercise, or sneezing (stress incontinence).  Needing to urinate often, but urinating only a small amount, or constantly dribbling urine (overflow incontinence).  Urinating because you cannot get to the bathroom in time due to a physical disability, such as  arthritis or injury, or communication and thinking problems, such as Alzheimer disease (functional incontinence). How is this diagnosed? This condition may be diagnosed based on:  Your medical history.  A physical exam.  Tests, such as: ? Urine tests. ? X-rays of your kidney and bladder. ? Ultrasound. ? CT scan. ? Cystoscopy. In this procedure, a health care provider inserts a tube with a light and camera (cystoscope) through the urethra and into the bladder in order to check for problems. ? Urodynamic testing. These tests assess how well the bladder, urethra, and sphincter can store and release urine. There are different types of urodynamic tests, and they vary depending on what the test is measuring. To help diagnose your condition, your health care provider may recommend that you keep a log of when you urinate and how much you urinate. How is this treated? Treatment for this condition depends on the type of incontinence that you have and its cause. Treatment may include:  Lifestyle changes, such as: ? Quitting smoking. ? Maintaining a healthy weight. ? Staying active. Try to get 150 minutes of moderate-intensity exercise every week. Ask your health care provider which activities are safe for you. ? Eating a healthy diet.  Avoid high-fat foods, like fried foods.  Avoid refined carbohydrates like white bread and white rice.  Limit how much alcohol and caffeine you drink.  Increase your fiber intake. Foods such as fresh fruits, vegetables, beans, and whole grains are healthy sources of fiber.  Pelvic floor muscle exercises.  Bladder training, such as lengthening the amount of time between bathroom breaks, or using the bathroom at regular intervals.  Using techniques to suppress bladder urges.   This can include distraction techniques or controlled breathing exercises.  Medicines to relax the bladder muscles and prevent bladder spasms.  Medicines to help slow or prevent the  growth of a man's prostate.  Botox injections. These can help relax the bladder muscles.  Using pulses of electricity to help change bladder reflexes (electrical nerve stimulation).  For women, using a medical device to prevent urine leaks. This is a small, tampon-like, disposable device that is inserted into the urethra.  Injecting collagen or carbon beads (bulking agents) into the urinary sphincter. These can help thicken tissue and close the bladder opening.  Surgery. Follow these instructions at home: Lifestyle  Limit alcohol and caffeine. These can fill your bladder quickly and irritate it.  Keep yourself clean to help prevent odors and skin damage. Ask your doctor about special skin creams and cleansers that can protect the skin from urine.  Consider wearing pads or adult diapers. Make sure to change them regularly, and always change them right after experiencing incontinence. General instructions  Take over-the-counter and prescription medicines only as told by your health care provider.  Use the bathroom about every 3-4 hours, even if you do not feel the need to urinate. Try to empty your bladder completely every time. After urinating, wait a minute. Then try to urinate again.  Make sure you are in a relaxed position while urinating.  If your incontinence is caused by nerve problems, keep a log of the medicines you take and the times you go to the bathroom.  Keep all follow-up visits as told by your health care provider. This is important. Contact a health care provider if:  You have pain that gets worse.  Your incontinence gets worse. Get help right away if:  You have a fever or chills.  You are unable to urinate.  You have redness in your groin area or down your legs. Summary  Urinary incontinence refers to a condition in which a person is unable to control where and when to pass urine.  This condition may be caused by medicines, infection, weak bladder  muscles, weak pelvic floor muscles, enlargement of the prostate (in men), or surgery.  The following factors increase your risk for developing this condition: older age, obesity, pregnancy and childbirth, menopause, neurological diseases, and chronic coughing.  There are several types of urinary incontinence. They include urge incontinence, stress incontinence, overflow incontinence, and functional incontinence.  This condition is usually treated first with lifestyle and behavioral changes, such as quitting smoking, eating a healthier diet, and doing regular pelvic floor exercises. Other treatment options include medicines, bulking agents, medical devices, electrical nerve stimulation, or surgery. This information is not intended to replace advice given to you by your health care provider. Make sure you discuss any questions you have with your health care provider. Document Revised: 08/16/2017 Document Reviewed: 11/15/2016 Elsevier Patient Education  2020 Else[employer] Inc. Kegel Exercises  Kegel exercises can help strengthen your pelvic floor muscles. The pelvic floor is a group of muscles that support your rectum, small intestine, and bladder. In females, pelvic floor muscles also help support the womb (uterus). These muscles help you control the flow of urine and stool. Kegel exercises are painless and simple, and they do not require any equipment. Your provider may suggest Kegel exercises to:  Improve bladder and bowel control.  Improve sexual response.  Improve weak pelvic floor muscles after surgery to remove the uterus (hysterectomy) or pregnancy (females).  Improve weak pelvic floor muscles after prostate gland   removal or surgery (males). Kegel exercises involve squeezing your pelvic floor muscles, which are the same muscles you squeeze when you try to stop the flow of urine or keep from passing gas. The exercises can be done while sitting, standing, or lying down, but it is best to vary  your position. Exercises How to do Kegel exercises: 1. Squeeze your pelvic floor muscles tight. You should feel a tight lift in your rectal area. If you are a female, you should also feel a tightness in your vaginal area. Keep your stomach, buttocks, and legs relaxed. 2. Hold the muscles tight for up to 10 seconds. 3. Breathe normally. 4. Relax your muscles. 5. Repeat as told by your health care provider. Repeat this exercise daily as told by your health care provider. Continue to do this exercise for at least 4-6 weeks, or for as long as told by your health care provider. You may be referred to a physical therapist who can help you learn more about how to do Kegel exercises. Depending on your condition, your health care provider may recommend:  Varying how long you squeeze your muscles.  Doing several sets of exercises every day.  Doing exercises for several weeks.  Making Kegel exercises a part of your regular exercise routine. This information is not intended to replace advice given to you by your health care provider. Make sure you discuss any questions you have with your health care provider. Document Revised: 03/26/2018 Document Reviewed: 03/26/2018 Elsevier Patient Education  2020 Elsevier Inc.  

## 2020-05-02 NOTE — Progress Notes (Signed)
Established Patient Office Visit  Subjective:  Patient ID: Paula Kelly, female    DOB: [birth year]  Age: 64 y.o. MRN: [id number]  CC:  Chief Complaint  Patient presents with  . Follow-up    6 month follow up on BP and diabetes, pt not fasting.   Marland Kitchen Urinary Incontinence    becoming worse.     HPI Paula Kelly presents for follow-up of hypertension, elevated cholesterol and diabetes.  Has been taking her atorvastatin but is now taking it at night.  Currently having no issues taking her medications.  She is retired.  She is staying active by [facility] [employer] and spending time with her friends and grandson.  Had received prescription for Chantix from pulmonology but has since found that Chantix has been recalled.  She will continue to try to quit smoking.  She has follow-up scheduled with ophthalmology soon.  She has been having troubles with incontinence.  When she has the urge to urinate she does not always make it to the bathroom.  She is able to sleep throughout the night and sit through a movie without getting up to urinate.  Denies much urine loss with cough or sneeze.  Past Medical History:  Diagnosis Date  . Allergy    seasonal  . Arthritis   . Cataract   . Depression   . Diabetes mellitus without complication ([place])   . H/O fibromyalgia    had for over 10 years  . Hyperlipidemia   . Hypertension   . Post-operative nausea and vomiting     Past Surgical History:  Procedure Laterality Date  . APPENDECTOMY    . COLONOSCOPY    . kidney stone removal  2013   used a basket extraction    Family History  Problem Relation Age of Onset  . Diabetes Mother   . Heart disease Father   . Emphysema Father   . CVA Sister   . AAA (abdominal aortic aneurysm) Brother     Social History   Socioeconomic History  . Marital status: Married    Spouse name: Not on file  . Number of children: Not on file  . Years of education: Not on file  . Highest education level: Not on  file  Occupational History  . Not on file  Tobacco Use  . Smoking status: Current Every Day Smoker    Packs/day: 1.00    Years: 47.00    Pack years: 47.00    Types: Cigarettes  . Smokeless tobacco: Never Used  Substance and Sexual Activity  . Alcohol use: No  . Drug use: No  . Sexual activity: Not on file  Other Topics Concern  . Not on file  Social History Narrative  . Not on file   Social Determinants of Health   Financial Resource Strain:   . Difficulty of Paying Living Expenses: Not on file  Food Insecurity:   . Worried About Charity fundraiser in the Last Year: Not on file  . Ran Out of Food in the Last Year: Not on file  Transportation Needs:   . Lack of Transportation (Medical): Not on file  . Lack of Transportation (Non-Medical): Not on file  Physical Activity:   . Days of Exercise per Week: Not on file  . Minutes of Exercise per Session: Not on file  Stress:   . Feeling of Stress : Not on file  Social Connections:   . Frequency of Communication with Friends and Family:  Not on file  . Frequency of Social Gatherings with Friends and Family: Not on file  . Attends Religious Services: Not on file  . Active Member of Clubs or Organizations: Not on file  . Attends Archivist Meetings: Not on file  . Marital Status: Not on file  Intimate Partner Violence:   . Fear of Current or Ex-Partner: Not on file  . Emotionally Abused: Not on file  . Physically Abused: Not on file  . Sexually Abused: Not on file    Outpatient Medications Prior to Visit  Medication Sig Dispense Refill  . amphetamine-dextroamphetamine (ADDERALL XR) 30 MG 24 hr capsule Will start this prescription once the 20 mg prescription has run out.    . ARIPiprazole (ABILIFY) 15 MG tablet Take by mouth.    . Ascorbic Acid (VITAMIN C) 100 MG tablet Take 100 mg by mouth daily.    Marland Kitchen aspirin EC 81 MG tablet Take 1 tablet (81 mg total) by mouth daily. 365 tablet 0  . atorvastatin (LIPITOR) 40 MG  tablet TAKE 1 TABLET(40 MG) BY MOUTH DAILY 100 tablet 1  . candesartan (ATACAND) 32 MG tablet TAKE 1 TABLET(32 MG) BY MOUTH DAILY 100 tablet 1  . Cholecalciferol (VITAMIN D3) 50 MCG (2000 UT) TABS Take 1 tablet by mouth daily. 90 tablet 2  . Coenzyme Q-10 200 MG CAPS 1-2 capsules daily for cardiovascular health    . DULoxetine (CYMBALTA) 60 MG capsule Take 2 capsules (120 mg total) by mouth daily. 786 capsule 1  . folic acid (FOLVITE) 1 MG tablet Take 1 mg by mouth daily.    Marland Kitchen glucose blood (FREESTYLE LITE) test strip Use to test 1-2 times daily. 100 each 5  . hydrochlorothiazide (HYDRODIURIL) 25 MG tablet TAKE 1 TABLET(25 MG) BY MOUTH DAILY 100 tablet 1  . Ibuprofen-Famotidine 800-26.6 MG TABS Take 1 tablet by mouth 3 (three) times daily as needed. 90 tablet 3  . metFORMIN (GLUCOPHAGE) 500 MG tablet TAKE 1 TABLET(500 MG) BY MOUTH TWICE DAILY 180 tablet 0  . Multiple Vitamin (MULTI-VITAMINS) TABS Take 1 tablet by mouth daily.    . vitamin E 400 UNIT capsule Take 1 capsule by mouth daily.    . methocarbamol (ROBAXIN) 500 MG tablet Take 1 tablet (500 mg total) by mouth every 8 (eight) hours as needed for muscle spasms. (Patient not taking: Reported on 05/02/2020) 40 tablet 0  . nicotine (NICODERM CQ - DOSED IN MG/24 HOURS) 21 mg/24hr patch Place 21 mg onto the skin daily. (Patient not taking: Reported on 05/02/2020)    . nicotine polacrilex (NICOTINE MINI) 4 MG lozenge Take 4 mg by mouth as needed for smoking cessation. (Patient not taking: Reported on 05/02/2020)    . nitroGLYCERIN (NITRODUR - DOSED IN MG/24 HR) 0.2 mg/hr patch Cut and apply 1/4 patch to most painful area q24h. (Patient not taking: Reported on 11/19/2019) 30 patch 11  . varenicline (CHANTIX CONTINUING MONTH PAK) 1 MG tablet Take 1 tablet (1 mg total) by mouth 2 (two) times daily. 60 tablet 5   No facility-administered medications prior to visit.    Allergies  Allergen Reactions  . Metronidazole Rash    ROS Review of Systems    Constitutional: Negative.   HENT: Negative.   Eyes: Negative for photophobia and visual disturbance.  Respiratory: Negative.   Cardiovascular: Negative.   Gastrointestinal: Negative.   Endocrine: Negative for polyphagia and polyuria.  Genitourinary: Positive for urgency. Negative for difficulty urinating, dysuria and enuresis.  Musculoskeletal: Negative  for gait problem and joint swelling.  Skin: Negative for pallor and rash.  Allergic/Immunologic: Negative for immunocompromised state.  Neurological: Negative for light-headedness and headaches.  Hematological: Does not bruise/bleed easily.  Psychiatric/Behavioral: Negative.       Objective:    Physical Exam Vitals and nursing note reviewed.  Constitutional:      General: She is not in acute distress.    Appearance: Normal appearance. She is not ill-appearing, toxic-appearing or diaphoretic.  HENT:     Head: Normocephalic and atraumatic.     Right Ear: Tympanic membrane, ear canal and external ear normal.     Left Ear: Tympanic membrane, ear canal and external ear normal.     Mouth/Throat:     Mouth: Mucous membranes are moist.     Pharynx: Oropharynx is clear. No oropharyngeal exudate or posterior oropharyngeal erythema.  Eyes:     General: No scleral icterus.       Right eye: No discharge.        Left eye: No discharge.     Conjunctiva/sclera: Conjunctivae normal.     Pupils: Pupils are equal, round, and reactive to light.  Cardiovascular:     Rate and Rhythm: Normal rate and regular rhythm.     Pulses:          Dorsalis pedis pulses are 1+ on the right side and 2+ on the left side.       Posterior tibial pulses are 1+ on the right side and 1+ on the left side.  Pulmonary:     Effort: Pulmonary effort is normal.     Breath sounds: Normal breath sounds.  Abdominal:     General: Abdomen is flat. Bowel sounds are normal. There is no distension.     Palpations: Abdomen is soft. There is no mass.     Tenderness: There is  no abdominal tenderness. There is no guarding or rebound.     Hernia: No hernia is present.  Musculoskeletal:     Cervical back: No rigidity or tenderness.     Right lower leg: No edema.     Left lower leg: No edema.  Lymphadenopathy:     Cervical: No cervical adenopathy.  Skin:    General: Skin is warm.  Neurological:     Mental Status: She is alert and oriented to person, place, and time.  Psychiatric:        Mood and Affect: Mood normal.        Behavior: Behavior normal.    Diabetic Foot Exam - Simple   Simple Foot Form Visual Inspection No deformities, no ulcerations, no other skin breakdown bilaterally: Yes Sensation Testing Intact to touch and monofilament testing bilaterally: Yes Pulse Check Posterior Tibialis and Dorsalis pulse intact bilaterally: Yes Comments    BP (!) 148/72   Pulse (!) 113   Temp (!) 96.8 F (36 C) (Tympanic)   Ht 5\' 4"  (1.626 m)   Wt 220 lb 12.8 oz (100.2 kg)   SpO2 95%   BMI 37.90 kg/m  Wt Readings from Last 3 Encounters:  05/02/20 220 lb 12.8 oz (100.2 kg)  01/04/20 220 lb (99.8 kg)  11/16/19 230 lb 9.6 oz (104.6 kg)     Health Maintenance Due  Topic Date Due  . OPHTHALMOLOGY EXAM  08/30/2019  . INFLUENZA VACCINE  03/20/2020  . HEMOGLOBIN A1C  04/27/2020    There are no preventive care reminders to display for this patient.  Lab Results  Component Value Date  TSH 1.93 10/23/2017   Lab Results  Component Value Date   WBC 7.1 10/26/2019   HGB 13.5 10/26/2019   HCT 39.7 10/26/2019   MCV 90.1 10/26/2019   PLT 348.0 10/26/2019   Lab Results  Component Value Date   NA 138 10/26/2019   K 4.2 10/26/2019   CO2 29 10/26/2019   GLUCOSE 118 (H) 10/26/2019   BUN 9 10/26/2019   CREATININE 0.74 10/26/2019   BILITOT 0.4 10/26/2019   ALKPHOS 81 10/26/2019   AST 17 10/26/2019   ALT 21 10/26/2019   PROT 6.5 10/26/2019   ALBUMIN 4.3 10/26/2019   CALCIUM 9.3 10/26/2019   GFR 79.26 10/26/2019   Lab Results  Component Value  Date   CHOL 183 10/26/2019   Lab Results  Component Value Date   HDL 44.70 10/26/2019   Lab Results  Component Value Date   LDLCALC 98 10/24/2018   Lab Results  Component Value Date   TRIG 217.0 (H) 10/26/2019   Lab Results  Component Value Date   CHOLHDL 4 10/26/2019   Lab Results  Component Value Date   HGBA1C 6.7 (H) 10/26/2019      Assessment & Plan:   Problem List Items Addressed This Visit      Cardiovascular and Mediastinum   Essential hypertension - Primary   Relevant Orders   CBC   Comprehensive metabolic panel   Urinalysis, Routine w reflex microscopic   Microalbumin / creatinine urine ratio     Endocrine   Controlled type 2 diabetes mellitus without complication, without long-term current use of insulin (HCC)   Relevant Orders   Comprehensive metabolic panel   Urinalysis, Routine w reflex microscopic   Microalbumin / creatinine urine ratio   Hemoglobin A1c     Other   Elevated cholesterol   Relevant Orders   LDL cholesterol, direct   Tobacco use   Urge incontinence   Relevant Orders   Urine Culture      No orders of the defined types were placed in this encounter.   Follow-up: Return in about 6 months (around 10/30/2020).  Given information on urgent continence in the Kegel exercises.  She will follow-up with her urogynecologist as well.  Asked her to continue to try to quit smoking.  Libby Maw, MD

## 2020-05-05 LAB — URINE CULTURE
MICRO NUMBER:: 10942155
SPECIMEN QUALITY:: ADEQUATE

## 2020-05-06 MED ORDER — NITROFURANTOIN MONOHYD MACRO 100 MG PO CAPS: 100.0000 mg | ORAL_CAPSULE | Freq: Two times a day (BID) | ORAL | 0 refills | Status: AC

## 2020-05-06 NOTE — Addendum Note (Signed)
Addended by: Abelino Derrick A on: 05/06/2020 02:09 PM   Modules accepted: Orders

## 2020-05-09 DIAGNOSIS — F909 Attention-deficit hyperactivity disorder, unspecified type: Secondary | ICD-10-CM | POA: Diagnosis not present

## 2020-05-09 DIAGNOSIS — F329 Major depressive disorder, single episode, unspecified: Secondary | ICD-10-CM | POA: Diagnosis not present

## 2020-05-09 DIAGNOSIS — Z79899 Other long term (current) drug therapy: Secondary | ICD-10-CM | POA: Diagnosis not present

## 2020-05-26 ENCOUNTER — Other Ambulatory Visit: Payer: Self-pay | Admitting: Family Medicine

## 2020-05-26 DIAGNOSIS — E7800 Pure hypercholesterolemia, unspecified: Secondary | ICD-10-CM

## 2020-06-09 ENCOUNTER — Encounter: Payer: Self-pay | Admitting: Family Medicine

## 2020-06-09 ENCOUNTER — Other Ambulatory Visit: Payer: Self-pay

## 2020-06-09 ENCOUNTER — Ambulatory Visit: Payer: Federal, State, Local not specified - PPO | Admitting: Family Medicine

## 2020-06-09 VITALS — BP 136/68 | HR 101 | Temp 97.7°F | Ht 64.0 in | Wt 218.0 lb

## 2020-06-09 DIAGNOSIS — K120 Recurrent oral aphthae: Secondary | ICD-10-CM | POA: Diagnosis not present

## 2020-06-09 DIAGNOSIS — K117 Disturbances of salivary secretion: Secondary | ICD-10-CM

## 2020-06-09 DIAGNOSIS — B029 Zoster without complications: Secondary | ICD-10-CM

## 2020-06-09 DIAGNOSIS — S0300XA Dislocation of jaw, unspecified side, initial encounter: Secondary | ICD-10-CM | POA: Insufficient documentation

## 2020-06-09 MED ORDER — GABAPENTIN 100 MG PO CAPS: ORAL_CAPSULE | ORAL | 0 refills | Status: DC

## 2020-06-09 MED ORDER — VALACYCLOVIR HCL 500 MG PO TABS: 500.0000 mg | ORAL_TABLET | Freq: Two times a day (BID) | ORAL | 0 refills | Status: AC

## 2020-06-09 MED ORDER — TRIAMCINOLONE IN ABSORBASE 0.05 % EX OINT: TOPICAL_OINTMENT | CUTANEOUS | 0 refills | Status: DC

## 2020-06-09 NOTE — Progress Notes (Signed)
Established Patient Office Visit  Subjective:  Patient ID: Paula Kelly, female    DOB: 13-May-1956  Age: 64 y.o. MRN: [id number]  CC:  Chief Complaint  Patient presents with  . Pain    left ear pain left side of jaw pain x 3 weeks.     HPI Paula Kelly presents for evaluation of a 3-week history of pain in the left side of her mouth and jaw associated with left ear pain.  There is some sores in her mouth that are nonhealing.  History of xerostomia.  Abilify recently discontinued secondary to tardive dyskinesia.  She has had no chest pain shortness of breath.  Jaw pain is not exacerbated with activity.  Blood pressures been controlled.  She is not aware of bruxism and her dentist has not commented.  Past Medical History:  Diagnosis Date  . Allergy    seasonal  . Arthritis   . Cataract   . Depression   . Diabetes mellitus without complication ([place])   . H/O fibromyalgia    had for over 10 years  . Hyperlipidemia   . Hypertension   . Post-operative nausea and vomiting     Past Surgical History:  Procedure Laterality Date  . APPENDECTOMY    . COLONOSCOPY    . kidney stone removal  2013   used a basket extraction    Family History  Problem Relation Age of Onset  . Diabetes Mother   . Heart disease Father   . Emphysema Father   . CVA Sister   . AAA (abdominal aortic aneurysm) Brother     Social History   Socioeconomic History  . Marital status: Married    Spouse name: Not on file  . Number of children: Not on file  . Years of education: Not on file  . Highest education level: Not on file  Occupational History  . Not on file  Tobacco Use  . Smoking status: Current Every Day Smoker    Packs/day: 1.00    Years: 47.00    Pack years: 47.00    Types: Cigarettes  . Smokeless tobacco: Never Used  Substance and Sexual Activity  . Alcohol use: No  . Drug use: No  . Sexual activity: Not on file  Other Topics Concern  . Not on file  Social History  Narrative  . Not on file   Social Determinants of Health   Financial Resource Strain:   . Difficulty of Paying Living Expenses: Not on file  Food Insecurity:   . Worried About Charity fundraiser in the Last Year: Not on file  . Ran Out of Food in the Last Year: Not on file  Transportation Needs:   . Lack of Transportation (Medical): Not on file  . Lack of Transportation (Non-Medical): Not on file  Physical Activity:   . Days of Exercise per Week: Not on file  . Minutes of Exercise per Session: Not on file  Stress:   . Feeling of Stress : Not on file  Social Connections:   . Frequency of Communication with Friends and Family: Not on file  . Frequency of Social Gatherings with Friends and Family: Not on file  . Attends Religious Services: Not on file  . Active Member of Clubs or Organizations: Not on file  . Attends Archivist Meetings: Not on file  . Marital Status: Not on file  Intimate Partner Violence:   . Fear of Current or Ex-Partner: Not on file  .  Emotionally Abused: Not on file  . Physically Abused: Not on file  . Sexually Abused: Not on file    Outpatient Medications Prior to Visit  Medication Sig Dispense Refill  . amphetamine-dextroamphetamine (ADDERALL XR) 30 MG 24 hr capsule Will start this prescription once the 20 mg prescription has run out.    . Ascorbic Acid (VITAMIN C) 100 MG tablet Take 100 mg by mouth daily.    Marland Kitchen aspirin EC 81 MG tablet Take 1 tablet (81 mg total) by mouth daily. 365 tablet 0  . atorvastatin (LIPITOR) 40 MG tablet TAKE 1 TABLET(40 MG) BY MOUTH DAILY 100 tablet 1  . candesartan (ATACAND) 32 MG tablet TAKE 1 TABLET(32 MG) BY MOUTH DAILY 100 tablet 1  . Cholecalciferol (VITAMIN D3) 50 MCG (2000 UT) TABS Take 1 tablet by mouth daily. 90 tablet 2  . Coenzyme Q-10 200 MG CAPS 1-2 capsules daily for cardiovascular health    . DULoxetine (CYMBALTA) 60 MG capsule Take 2 capsules (120 mg total) by mouth daily. 829 capsule 1  . folic acid  (FOLVITE) 1 MG tablet Take 1 mg by mouth daily.    . hydrochlorothiazide (HYDRODIURIL) 25 MG tablet TAKE 1 TABLET(25 MG) BY MOUTH DAILY 100 tablet 1  . Ibuprofen-Famotidine 800-26.6 MG TABS Take 1 tablet by mouth 3 (three) times daily as needed. 90 tablet 3  . metFORMIN (GLUCOPHAGE) 500 MG tablet TAKE 1 TABLET(500 MG) BY MOUTH TWICE DAILY 180 tablet 0  . Multiple Vitamin (MULTI-VITAMINS) TABS Take 1 tablet by mouth daily.    . vitamin E 400 UNIT capsule Take 1 capsule by mouth daily.    . ARIPiprazole (ABILIFY) 15 MG tablet Take by mouth.    Marland Kitchen glucose blood (FREESTYLE LITE) test strip Use to test 1-2 times daily. 100 each 5  . varenicline (CHANTIX CONTINUING MONTH PAK) 1 MG tablet Take 1 tablet (1 mg total) by mouth 2 (two) times daily. (Patient not taking: Reported on 06/09/2020) 60 tablet 5  . methocarbamol (ROBAXIN) 500 MG tablet Take 1 tablet (500 mg total) by mouth every 8 (eight) hours as needed for muscle spasms. (Patient not taking: Reported on 05/02/2020) 40 tablet 0  . nicotine (NICODERM CQ - DOSED IN MG/24 HOURS) 21 mg/24hr patch Place 21 mg onto the skin daily. (Patient not taking: Reported on 05/02/2020)    . nicotine polacrilex (NICOTINE MINI) 4 MG lozenge Take 4 mg by mouth as needed for smoking cessation. (Patient not taking: Reported on 05/02/2020)    . nitroGLYCERIN (NITRODUR - DOSED IN MG/24 HR) 0.2 mg/hr patch Cut and apply 1/4 patch to most painful area q24h. (Patient not taking: Reported on 11/19/2019) 30 patch 11   No facility-administered medications prior to visit.    Allergies  Allergen Reactions  . Metronidazole Rash    ROS Review of Systems  Constitutional: Negative.   HENT: Positive for ear pain. Negative for dental problem.   Eyes: Negative for photophobia and visual disturbance.  Respiratory: Negative.  Negative for chest tightness, shortness of breath and wheezing.   Cardiovascular: Negative for chest pain and palpitations.  Gastrointestinal: Negative for  nausea and vomiting.  Psychiatric/Behavioral: Negative.       Objective:    Physical Exam  BP 136/68   Pulse (!) 101   Temp 97.7 F (36.5 C) (Tympanic)   Ht 5\' 4"  (1.626 m)   Wt 218 lb (98.9 kg)   SpO2 97%   BMI 37.42 kg/m  Wt Readings from Last 3 Encounters:  06/09/20 218 lb (98.9 kg)  05/02/20 220 lb 12.8 oz (100.2 kg)  01/04/20 220 lb (99.8 kg)     Health Maintenance Due  Topic Date Due  . OPHTHALMOLOGY EXAM  08/30/2019    There are no preventive care reminders to display for this patient.  Lab Results  Component Value Date   TSH 1.93 10/23/2017   Lab Results  Component Value Date   WBC 11.2 (H) 05/02/2020   HGB 14.9 05/02/2020   HCT 44.8 05/02/2020   MCV 90.0 05/02/2020   PLT 381.0 05/02/2020   Lab Results  Component Value Date   NA 138 05/02/2020   K 4.2 05/02/2020   CO2 28 05/02/2020   GLUCOSE 103 (H) 05/02/2020   BUN 11 05/02/2020   CREATININE 0.75 05/02/2020   BILITOT 0.4 05/02/2020   ALKPHOS 82 05/02/2020   AST 16 05/02/2020   ALT 20 05/02/2020   PROT 7.0 05/02/2020   ALBUMIN 4.8 05/02/2020   CALCIUM 10.3 05/02/2020   GFR 77.92 05/02/2020   Lab Results  Component Value Date   CHOL 183 10/26/2019   Lab Results  Component Value Date   HDL 44.70 10/26/2019   Lab Results  Component Value Date   LDLCALC 98 10/24/2018   Lab Results  Component Value Date   TRIG 217.0 (H) 10/26/2019   Lab Results  Component Value Date   CHOLHDL 4 10/26/2019   Lab Results  Component Value Date   HGBA1C 7.0 (H) 05/02/2020      Assessment & Plan:   Problem List Items Addressed This Visit      Digestive   Xerostomia - Primary     Other   Herpes zoster without complication   Relevant Medications   valACYclovir (VALTREX) 500 MG tablet   gabapentin (NEURONTIN) 100 MG capsule   Aphthous ulcer   Relevant Medications   Triamcinolone Acetonide (TRIAMCINOLONE IN ABSORBASE) 0.05 % OINT      Meds ordered this encounter  Medications  .  DISCONTD: Triamcinolone Acetonide (TRIAMCINOLONE IN ABSORBASE) 0.05 % OINT    Sig: Apply to lesions in mouth daily.    Dispense:  30 g    Refill:  0  . valACYclovir (VALTREX) 500 MG tablet    Sig: Take 1 tablet (500 mg total) by mouth 2 (two) times daily for 7 days.    Dispense:  14 tablet    Refill:  0  . DISCONTD: gabapentin (NEURONTIN) 100 MG capsule    Sig: Take 2 at night, one in the morning and one late afternoon.    Dispense:  30 capsule    Refill:  0  . gabapentin (NEURONTIN) 100 MG capsule    Sig: Take 2 at night, one in the morning and one late afternoon.    Dispense:  40 capsule    Refill:  0  . Triamcinolone Acetonide (TRIAMCINOLONE IN ABSORBASE) 0.05 % OINT    Sig: Apply to lesions in mouth daily.    Dispense:  30 g    Refill:  0    Is this equivalent to triamcinolone oral paste?    Follow-up: Return if symptoms worsen or fail to improve.   Consider ENT referral as needed.  Will see dentist next month. Libby Maw, MD

## 2020-06-09 NOTE — Patient Instructions (Signed)
Oral Ulcers Oral ulcers are small sores inside the mouth or near the mouth. They may occur on or inside the lips, inside the cheeks, on the tongue, or anywhere else inside or near the mouth. They may be called canker sores or cold sores, which are two types of oral ulcers. Many oral ulcers are harmless and go away on their own. In some cases, oral ulcers may require medical care to determine the cause and proper treatment. What are the causes? Common causes of this condition include:  Infections caused by viruses, bacteria, or fungi.  Emotional stress.  Foods or chemicals that irritate the mouth.  Injury or physical irritation of the mouth.  Medicines.  Allergies.  Tobacco use. Less common causes include:  Skin disease.  A type of herpes virus infection (herpes simplexor herpes zoster).  Oral cancer. In some cases, the cause may not be known. What increases the risk? You are more likely to develop this condition if:  You wear dental braces, dentures, or retainers.  You have poor oral hygiene.  You have sensitive skin.  You have a condition that affects the entire body (systemic condition), such as an immune disorder. What are the signs or symptoms? The main symptom of this condition is having one or more oval-shaped or round ulcers that have red borders. Symptoms may vary depending on the cause. This includes:  Location of the ulcers. Ulcers may be found inside the mouth, on the gums, or on the insides of the lips or cheeks. They may also be found on the lips or on skin that is near the mouth, such as the cheeks or chin.  Pain. Ulcers can be painful and uncomfortable, or they can be painless.  Appearance of the ulcers. They may look like red blisters and be filled with fluid, or they may be white or yellow patches.  Frequency of outbreaks. Ulcers may go away permanently after one outbreak, or they may come back (recur) often or rarely. How is this diagnosed? This  condition is diagnosed with a physical exam. Your health care provider may ask you questions about your lifestyle and your medical history. You may have tests, including:  Blood tests.  Removal of a small number of cells from an ulcer to be examined under a microscope (biopsy). How is this treated? Treatment depends on the severity and cause of the condition. Oral ulcers often go away on their own in 1-2 weeks. Treatment may include medicines, such as:  Medicines to treat a viral infection (antivirals), a bacterial infection (antibiotics), or a fungal infection (antifungals).  Medicines to help control pain. This may include: ? Over-the-counter pain medicines. ? Gel, cream, or spray to numb the area (topical anesthetic) if you have severe pain. ? Other medicines to coat or numb your mouth. Follow these instructions at home: Medicines  Take or use over-the-counter and prescription medicines only as told by your health care provider.  If you were prescribed an antibiotic medicine, take it as told by your health care provider. Do not stop taking the antibiotic even if you start to feel better.  Do not use products that contain benzocaine (including numbing gels) to treat teething or mouth pain in children who are younger than 2 years. These products may cause a rare but serious blood condition. Eating and drinking  Eat a balanced diet. Do not eat: ? Spicy foods. ? Citrus, such as oranges. ? Other foods and drinks that you think may cause or irritate your ulcers.    Drink enough fluid to keep your urine pale yellow.  Avoid drinking alcohol. Lifestyle   Practice good oral hygiene: ? Gently brush your teeth with a soft toothbrush two times a day. ? Floss your teeth every day. ? Get regular dental cleanings and checkups.  Do not use any products that contain nicotine or tobacco, such as cigarettes and e-cigarettes. If you need help quitting, ask your health care provider. Managing  pain  If directed, put ice on your face in the affected area to help reduce pain. ? Put ice in a plastic bag. ? Place a towel between your skin and the bag. ? Leave the ice on for 20 minutes, 2-3 times a day.  Avoid physical or chemical irritants that may have caused the ulcers or made them worse, such as mouthwashes that contain alcohol (ethanol). If you wear dental braces, dentures, or retainers, work with your health care provider to make sure these devices are fitted correctly.  If you were prescribed a prescription mouthwash to help reduce pain in your mouth, use it as told by your health care provider. General instructions  Rinse with a salt-water mixture 3-4 times a day or as told by your health care provider. To make a salt-water mixture, completely dissolve -1 tsp (3-6 g) of salt in 1 cup (237 mL) of warm water.  Keep all follow-up visits as told by your health care provider. This is important. Contact a health care provider if:  You have: ? Pain that gets worse or does not get better with medicine. ? Four or more ulcers at one time. ? A fever. ? New ulcers that look or feel different from other ulcers you have. ? Inflammation in one eye or both eyes. ? Ulcers that do not go away after 10 days.  You develop new symptoms in your mouth, such as: ? Bleeding or crusting around your lips or gums. ? Tooth pain. ? Difficulty swallowing.  You develop symptoms on your skin or genitals, such as: ? A rash or blisters. ? Burning or itching sensations.  Your ulcers begin or get worse after you start a new medicine. Get help right away if you have:  Difficulty breathing.  Swelling in your face or neck.  Excessive bleeding from your mouth.  Severe pain. Summary  Oral ulcers may occur anywhere inside or near the mouth.  They can be caused by many things, such as infections, stress, injury or irritation, or tobacco use.  Oral ulcers can be painful or painless.  Treatment  may include medicines to relieve pain or to treat an infection (if appropriate).  Most oral ulcers go away in 1-2 weeks. This information is not intended to replace advice given to you by your health care provider. Make sure you discuss any questions you have with your health care provider. Document Revised: 12/19/2017 Document Reviewed: 12/19/2017 Elsevier Patient Education  2020 [employer]  

## 2020-06-16 ENCOUNTER — Encounter: Payer: Self-pay | Admitting: Family Medicine

## 2020-06-16 ENCOUNTER — Ambulatory Visit: Payer: Federal, State, Local not specified - PPO | Admitting: Family Medicine

## 2020-06-16 ENCOUNTER — Ambulatory Visit: Payer: Self-pay

## 2020-06-16 ENCOUNTER — Other Ambulatory Visit: Payer: Self-pay

## 2020-06-16 VITALS — BP 150/70 | HR 105 | Ht 64.0 in | Wt 217.0 lb

## 2020-06-16 DIAGNOSIS — M778 Other enthesopathies, not elsewhere classified: Secondary | ICD-10-CM | POA: Diagnosis not present

## 2020-06-16 MED ORDER — TRIAMCINOLONE ACETONIDE 40 MG/ML IJ SUSP
40.0000 mg | Freq: Once | INTRAMUSCULAR | Status: AC
Start: 2020-06-16 — End: 2020-06-16
  Administered 2020-06-16: 40 mg via INTRA_ARTICULAR

## 2020-06-16 NOTE — Progress Notes (Signed)
Paula Kelly - 64 y.o. female MRN [id number]  Date of birth: 1956/08/17  SUBJECTIVE:  Including CC & ROS.  Chief Complaint  Patient presents with  . Shoulder Pain    left x 2 months    Paula Kelly is a 64 y.o. female that is presenting with acute on chronic left shoulder pain.  Denies any specific inciting event.  Pain is severe and is keeping her up at night.  It is painful to lie on that side.   Review of Systems See HPI   HISTORY: Past Medical, Surgical, Social, and Family History Reviewed & Updated per EMR.   Pertinent Historical Findings include:  Past Medical History:  Diagnosis Date  . Allergy    seasonal  . Arthritis   . Cataract   . Depression   . Diabetes mellitus without complication ([place])   . H/O fibromyalgia    had for over 10 years  . Hyperlipidemia   . Hypertension   . Post-operative nausea and vomiting     Past Surgical History:  Procedure Laterality Date  . APPENDECTOMY    . COLONOSCOPY    . kidney stone removal  2013   used a basket extraction    Family History  Problem Relation Age of Onset  . Diabetes Mother   . Heart disease Father   . Emphysema Father   . CVA Sister   . AAA (abdominal aortic aneurysm) Brother     Social History   Socioeconomic History  . Marital status: Married    Spouse name: Not on file  . Number of children: Not on file  . Years of education: Not on file  . Highest education level: Not on file  Occupational History  . Not on file  Tobacco Use  . Smoking status: Current Every Day Smoker    Packs/day: 1.00    Years: 47.00    Pack years: 47.00    Types: Cigarettes  . Smokeless tobacco: Never Used  Substance and Sexual Activity  . Alcohol use: No  . Drug use: No  . Sexual activity: Not on file  Other Topics Concern  . Not on file  Social History Narrative  . Not on file   Social Determinants of Health   Financial Resource Strain:   . Difficulty of Paying Living Expenses: Not on file    Food Insecurity:   . Worried About Charity fundraiser in the Last Year: Not on file  . Ran Out of Food in the Last Year: Not on file  Transportation Needs:   . Lack of Transportation (Medical): Not on file  . Lack of Transportation (Non-Medical): Not on file  Physical Activity:   . Days of Exercise per Week: Not on file  . Minutes of Exercise per Session: Not on file  Stress:   . Feeling of Stress : Not on file  Social Connections:   . Frequency of Communication with Friends and Family: Not on file  . Frequency of Social Gatherings with Friends and Family: Not on file  . Attends Religious Services: Not on file  . Active Member of Clubs or Organizations: Not on file  . Attends Archivist Meetings: Not on file  . Marital Status: Not on file  Intimate Partner Violence:   . Fear of Current or Ex-Partner: Not on file  . Emotionally Abused: Not on file  . Physically Abused: Not on file  . Sexually Abused: Not on file     PHYSICAL  EXAM:  VS: BP (!) 150/70   Pulse (!) 105   Ht 5\' 4"  (1.626 m)   Wt 217 lb (98.4 kg)   BMI 37.25 kg/m  Physical Exam Gen: NAD, alert, cooperative with exam, well-appearing MSK:  Left shoulder: Limited flexion and external rotation. Normal internal rotation. No significant pain with empty can testing. Normal speeds test. Neurovascular intact   Aspiration/Injection Procedure Note Paula Kelly 1956-02-09  Procedure: Injection Indications: Left shoulder pain  Procedure Details Consent: Risks of procedure as well as the alternatives and risks of each were explained to the (patient/caregiver).  Consent for procedure obtained. Time Out: Verified patient identification, verified procedure, site/side was marked, verified correct patient position, special equipment/implants available, medications/allergies/relevent history reviewed, required imaging and test results available.  Performed.  The area was cleaned with iodine and alcohol  swabs.    The left glenohumeral joint was injected using 1 cc's of 40 mg Kenalog and 4 cc's of 0.25% bupivacaine with a 22 1 1/2" needle.  Ultrasound was used. Images were obtained in short views showing the injection.     A sterile dressing was applied.  Patient did tolerate procedure well.     ASSESSMENT & PLAN:   Capsulitis of left shoulder Symptoms seem more suggestive of a capsulitis as opposed to the rotator cuff at this time. -Counseled on home exercise therapy and supportive care. -Injection. -Could consider subacromial injection or physical therapy versus lab work.

## 2020-06-16 NOTE — Addendum Note (Signed)
Addended by: Sherrie George F on: 06/16/2020 01:17 PM   Modules accepted: Orders

## 2020-06-16 NOTE — Assessment & Plan Note (Signed)
Symptoms seem more suggestive of a capsulitis as opposed to the rotator cuff at this time. -Counseled on home exercise therapy and supportive care. -Injection. -Could consider subacromial injection or physical therapy versus lab work.

## 2020-06-16 NOTE — Patient Instructions (Signed)
Good to see you Please try heat  Please try the exercises   Please send me a message in MyChart with any questions or updates.  Please see me back in 4-6 weeks.   --Dr. Raeford Razor

## 2020-06-20 DIAGNOSIS — F909 Attention-deficit hyperactivity disorder, unspecified type: Secondary | ICD-10-CM | POA: Diagnosis not present

## 2020-06-20 DIAGNOSIS — Z79899 Other long term (current) drug therapy: Secondary | ICD-10-CM | POA: Diagnosis not present

## 2020-06-20 DIAGNOSIS — F329 Major depressive disorder, single episode, unspecified: Secondary | ICD-10-CM | POA: Diagnosis not present

## 2020-07-21 ENCOUNTER — Other Ambulatory Visit: Payer: Self-pay

## 2020-07-21 ENCOUNTER — Encounter: Payer: Self-pay | Admitting: Family Medicine

## 2020-07-21 ENCOUNTER — Ambulatory Visit: Payer: Federal, State, Local not specified - PPO | Admitting: Family Medicine

## 2020-07-21 VITALS — BP 119/76 | HR 93 | Ht 64.0 in | Wt 219.0 lb

## 2020-07-21 DIAGNOSIS — M778 Other enthesopathies, not elsewhere classified: Secondary | ICD-10-CM

## 2020-07-21 DIAGNOSIS — H43813 Vitreous degeneration, bilateral: Secondary | ICD-10-CM | POA: Diagnosis not present

## 2020-07-21 DIAGNOSIS — H2513 Age-related nuclear cataract, bilateral: Secondary | ICD-10-CM | POA: Diagnosis not present

## 2020-07-21 DIAGNOSIS — M7531 Calcific tendinitis of right shoulder: Secondary | ICD-10-CM

## 2020-07-21 DIAGNOSIS — H04123 Dry eye syndrome of bilateral lacrimal glands: Secondary | ICD-10-CM | POA: Diagnosis not present

## 2020-07-21 DIAGNOSIS — H40033 Anatomical narrow angle, bilateral: Secondary | ICD-10-CM | POA: Diagnosis not present

## 2020-07-21 LAB — HM DIABETES EYE EXAM

## 2020-07-21 NOTE — Patient Instructions (Signed)
Good to see you I will call with the results from today  Please call to schedule the MRI   Please send me a message in MyChart with any questions or updates.  We will set up a virtual visit once the MRI is resulted. .   --Dr. Raeford Razor

## 2020-07-21 NOTE — Progress Notes (Signed)
Paula Kelly - 64 y.o. female MRN [id number]  Date of birth: Oct 27, 1955  SUBJECTIVE:  Including CC & ROS.  Chief Complaint  Patient presents with  . Follow-up    left shoulder    TIAHNA CURE is a 64 y.o. female that is presenting with acute worsening of her bilateral shoulder pain.  We have tried injections as well as therapy.  He has tried over-the-counter medications as well as prescription medications her pain is reoccurring despite therapy over the past year.   Review of Systems See HPI   HISTORY: Past Medical, Surgical, Social, and Family History Reviewed & Updated per EMR.   Pertinent Historical Findings include:  Past Medical History:  Diagnosis Date  . Allergy    seasonal  . Arthritis   . Cataract   . Depression   . Diabetes mellitus without complication ([place])   . H/O fibromyalgia    had for over 10 years  . Hyperlipidemia   . Hypertension   . Post-operative nausea and vomiting     Past Surgical History:  Procedure Laterality Date  . APPENDECTOMY    . COLONOSCOPY    . kidney stone removal  2013   used a basket extraction    Family History  Problem Relation Age of Onset  . Diabetes Mother   . Heart disease Father   . Emphysema Father   . CVA Sister   . AAA (abdominal aortic aneurysm) Brother     Social History   Socioeconomic History  . Marital status: Married    Spouse name: Not on file  . Number of children: Not on file  . Years of education: Not on file  . Highest education level: Not on file  Occupational History  . Not on file  Tobacco Use  . Smoking status: Current Every Day Smoker    Packs/day: 1.00    Years: 47.00    Pack years: 47.00    Types: Cigarettes  . Smokeless tobacco: Never Used  Substance and Sexual Activity  . Alcohol use: No  . Drug use: No  . Sexual activity: Not on file  Other Topics Concern  . Not on file  Social History Narrative  . Not on file   Social Determinants of Health   Financial  Resource Strain:   . Difficulty of Paying Living Expenses: Not on file  Food Insecurity:   . Worried About Charity fundraiser in the Last Year: Not on file  . Ran Out of Food in the Last Year: Not on file  Transportation Needs:   . Lack of Transportation (Medical): Not on file  . Lack of Transportation (Non-Medical): Not on file  Physical Activity:   . Days of Exercise per Week: Not on file  . Minutes of Exercise per Session: Not on file  Stress:   . Feeling of Stress : Not on file  Social Connections:   . Frequency of Communication with Friends and Family: Not on file  . Frequency of Social Gatherings with Friends and Family: Not on file  . Attends Religious Services: Not on file  . Active Member of Clubs or Organizations: Not on file  . Attends Archivist Meetings: Not on file  . Marital Status: Not on file  Intimate Partner Violence:   . Fear of Current or Ex-Partner: Not on file  . Emotionally Abused: Not on file  . Physically Abused: Not on file  . Sexually Abused: Not on file  PHYSICAL EXAM:  VS: BP 119/76   Pulse 93   Ht 5\' 4"  (1.626 m)   Wt 219 lb (99.3 kg)   BMI 37.59 kg/m  Physical Exam Gen: NAD, alert, cooperative with exam, well-appearing   ASSESSMENT & PLAN:   Capsulitis of left shoulder Acute on chronic in nature.  Pain is ongoing despite injections, physical therapy and medications. -Counseled on home exercise therapy and supportive care. -MRI to evaluate for adhesive capsulitis.  Calcific tendinitis of right shoulder Acute on chronic in nature.  Previous imaging was demonstrating enthesophytes.  We have tried injections medications and physical therapy.  Pain has reoccurred.  Less likely for polymyalgia. -Counseled on home exercise therapy and supportive care. -Uric acid, ANA, sed rate and CRP. -MRI to evaluate for rotator cuff tear secondary to calcific changes.

## 2020-07-21 NOTE — Assessment & Plan Note (Signed)
Acute on chronic in nature.  Pain is ongoing despite injections, physical therapy and medications. -Counseled on home exercise therapy and supportive care. -MRI to evaluate for adhesive capsulitis.

## 2020-07-21 NOTE — Assessment & Plan Note (Signed)
Acute on chronic in nature.  Previous imaging was demonstrating enthesophytes.  We have tried injections medications and physical therapy.  Pain has reoccurred.  Less likely for polymyalgia. -Counseled on home exercise therapy and supportive care. -Uric acid, ANA, sed rate and CRP. -MRI to evaluate for rotator cuff tear secondary to calcific changes.

## 2020-07-23 LAB — ANA,IFA RA DIAG PNL W/RFLX TIT/PATN
ANA Titer 1: NEGATIVE
Cyclic Citrullin Peptide Ab: 6 units (ref 0–19)
Rheumatoid fact SerPl-aCnc: <10 IU/mL (ref <14.0)

## 2020-07-23 LAB — C-REACTIVE PROTEIN: CRP: 3 mg/L (ref 0–10)

## 2020-07-23 LAB — URIC ACID: Uric Acid: 5.1 mg/dL (ref 3.0–7.2)

## 2020-07-23 LAB — SEDIMENTATION RATE: Sed Rate: 4 mm/hr (ref 0–40)

## 2020-07-29 ENCOUNTER — Other Ambulatory Visit: Payer: Self-pay | Admitting: Family Medicine

## 2020-07-29 DIAGNOSIS — E119 Type 2 diabetes mellitus without complications: Secondary | ICD-10-CM

## 2020-07-29 NOTE — Telephone Encounter (Signed)
Last OV 06/09/20 Last fill 04/22/20  #180/0

## 2020-08-01 ENCOUNTER — Telehealth: Payer: Self-pay | Admitting: Family Medicine

## 2020-08-01 DIAGNOSIS — Z79899 Other long term (current) drug therapy: Secondary | ICD-10-CM | POA: Diagnosis not present

## 2020-08-01 DIAGNOSIS — F329 Major depressive disorder, single episode, unspecified: Secondary | ICD-10-CM | POA: Diagnosis not present

## 2020-08-01 DIAGNOSIS — F909 Attention-deficit hyperactivity disorder, unspecified type: Secondary | ICD-10-CM | POA: Diagnosis not present

## 2020-08-01 NOTE — Telephone Encounter (Signed)
Informed of results.   Rosemarie Ax, MD [facility] 08/01/2020, 8:48 AM

## 2020-08-18 ENCOUNTER — Ambulatory Visit
Admission: RE | Admit: 2020-08-18 | Discharge: 2020-08-18 | Disposition: A | Discharge status: Home / Self Care | Payer: Federal, State, Local not specified - PPO | Source: Ambulatory Visit | Attending: Family Medicine | Admitting: Family Medicine

## 2020-08-18 ENCOUNTER — Other Ambulatory Visit: Payer: Self-pay

## 2020-08-18 DIAGNOSIS — M25511 Pain in right shoulder: Secondary | ICD-10-CM | POA: Diagnosis not present

## 2020-08-18 DIAGNOSIS — M7531 Calcific tendinitis of right shoulder: Secondary | ICD-10-CM

## 2020-08-18 DIAGNOSIS — M778 Other enthesopathies, not elsewhere classified: Secondary | ICD-10-CM

## 2020-08-18 DIAGNOSIS — S46012A Strain of muscle(s) and tendon(s) of the rotator cuff of left shoulder, initial encounter: Secondary | ICD-10-CM | POA: Diagnosis not present

## 2020-08-23 ENCOUNTER — Other Ambulatory Visit: Payer: Self-pay

## 2020-08-23 ENCOUNTER — Telehealth (INDEPENDENT_AMBULATORY_CARE_PROVIDER_SITE_OTHER): Payer: Federal, State, Local not specified - PPO | Admitting: Family Medicine

## 2020-08-23 DIAGNOSIS — M778 Other enthesopathies, not elsewhere classified: Secondary | ICD-10-CM | POA: Diagnosis not present

## 2020-08-23 NOTE — Progress Notes (Signed)
Virtual Visit via Telephone Note  I connected with Paula Kelly on 08/23/20 at  1:10 PM EST by telephone and verified that I am speaking with the correct person using two identifiers.  Location: Patient: vehicle  Provider: office   I discussed the limitations, risks, security and privacy concerns of performing an evaluation and management service by telephone and the availability of in person appointments. I also discussed with the patient that there may be a patient responsible charge related to this service. The patient expressed understanding and agreed to proceed.   History of Present Illness:  [name] is a 65 yo F that is following up after an MRI of her left and right shoulder. They were revealing for a synovitis and/or capsulitis. She has had a normal lab work up thus far. Has tried injections in the past.    Observations/Objective:   Assessment and Plan:  Capsulitis of left and right shoulder:  MRi was revealing for capsulitis. Lab work has been normal to this point.  - counseled on home exercise therapy and supportive care - consider GH injection or PT.   Follow Up Instructions:    I discussed the assessment and treatment plan with the patient. The patient was provided an opportunity to ask questions and all were answered. The patient agreed with the plan and demonstrated an understanding of the instructions.   The patient was advised to call back or seek an in-person evaluation if the symptoms worsen or if the condition fails to improve as anticipated.  I provided 5 minutes of non-face-to-face time during this encounter.   Clare Gandy, MD

## 2020-08-23 NOTE — Assessment & Plan Note (Signed)
MRi was revealing for capsulitis. Lab work has been normal to this point.  - counseled on home exercise therapy and supportive care - consider GH injection or PT.  

## 2020-08-23 NOTE — Assessment & Plan Note (Signed)
MRi was revealing for capsulitis. Lab work has been normal to this point.  - counseled on home exercise therapy and supportive care - consider GH injection or PT.

## 2020-09-06 ENCOUNTER — Other Ambulatory Visit: Payer: Self-pay | Admitting: Family Medicine

## 2020-09-06 DIAGNOSIS — I10 Essential (primary) hypertension: Secondary | ICD-10-CM

## 2020-09-06 DIAGNOSIS — E119 Type 2 diabetes mellitus without complications: Secondary | ICD-10-CM

## 2020-09-12 DIAGNOSIS — F329 Major depressive disorder, single episode, unspecified: Secondary | ICD-10-CM | POA: Diagnosis not present

## 2020-09-12 DIAGNOSIS — F909 Attention-deficit hyperactivity disorder, unspecified type: Secondary | ICD-10-CM | POA: Diagnosis not present

## 2020-09-12 DIAGNOSIS — Z79899 Other long term (current) drug therapy: Secondary | ICD-10-CM | POA: Diagnosis not present

## 2020-10-31 ENCOUNTER — Encounter: Payer: Self-pay | Admitting: Family Medicine

## 2020-10-31 ENCOUNTER — Ambulatory Visit: Payer: Federal, State, Local not specified - PPO | Admitting: Family Medicine

## 2020-10-31 ENCOUNTER — Other Ambulatory Visit: Payer: Self-pay

## 2020-10-31 VITALS — BP 144/68 | HR 110 | Temp 97.4°F | Ht 64.0 in | Wt 213.0 lb

## 2020-10-31 DIAGNOSIS — I10 Essential (primary) hypertension: Secondary | ICD-10-CM | POA: Diagnosis not present

## 2020-10-31 DIAGNOSIS — E119 Type 2 diabetes mellitus without complications: Secondary | ICD-10-CM | POA: Diagnosis not present

## 2020-10-31 DIAGNOSIS — E7800 Pure hypercholesterolemia, unspecified: Secondary | ICD-10-CM

## 2020-10-31 NOTE — Progress Notes (Signed)
Established Patient Office Visit  Subjective:  Patient ID: Paula Kelly, female    DOB: 1956-08-07  Age: 66 y.o. MRN: [id number]  CC:  Chief Complaint  Patient presents with  . Follow-up    Follow up on labs. No concerns.     HPI Paula Kelly presents for follow up of htn, diabetes, and elevated cholesterol.non fasting this am. Exercising and losing some weight. Htn well controlled with atacand and hctz. DM with hga1c under 7 on metformin. Ldl cholesterol well controlled with atorvastatin.  Daughter and son moved in with her.   Past Medical History:  Diagnosis Date  . Allergy    seasonal  . Arthritis   . Cataract   . Depression   . Diabetes mellitus without complication ([place])   . H/O fibromyalgia    had for over 10 years  . Hyperlipidemia   . Hypertension   . Post-operative nausea and vomiting     Past Surgical History:  Procedure Laterality Date  . APPENDECTOMY    . COLONOSCOPY    . kidney stone removal  2013   used a basket extraction    Family History  Problem Relation Age of Onset  . Diabetes Mother   . Heart disease Father   . Emphysema Father   . CVA Sister   . AAA (abdominal aortic aneurysm) Brother     Social History   Socioeconomic History  . Marital status: Married    Spouse name: Not on file  . Number of children: Not on file  . Years of education: Not on file  . Highest education level: Not on file  Occupational History  . Not on file  Tobacco Use  . Smoking status: Current Every Day Smoker    Packs/day: 1.00    Years: 47.00    Pack years: 47.00    Types: Cigarettes  . Smokeless tobacco: Never Used  Substance and Sexual Activity  . Alcohol use: No  . Drug use: No  . Sexual activity: Not on file  Other Topics Concern  . Not on file  Social History Narrative  . Not on file   Social Determinants of Health   Financial Resource Strain: Not on file  Food Insecurity: Not on file  Transportation Needs: Not on file   Physical Activity: Not on file  Stress: Not on file  Social Connections: Not on file  Intimate Partner Violence: Not on file    Outpatient Medications Prior to Visit  Medication Sig Dispense Refill  . amphetamine-dextroamphetamine (ADDERALL XR) 30 MG 24 hr capsule Will start this prescription once the 20 mg prescription has run out.    . Ascorbic Acid (VITAMIN C) 100 MG tablet Take 100 mg by mouth daily.    Marland Kitchen aspirin EC 81 MG tablet Take 1 tablet (81 mg total) by mouth daily. 365 tablet 0  . atorvastatin (LIPITOR) 40 MG tablet TAKE 1 TABLET(40 MG) BY MOUTH DAILY 100 tablet 1  . candesartan (ATACAND) 32 MG tablet TAKE 1 TABLET(32 MG) BY MOUTH DAILY 100 tablet 1  . Cholecalciferol (VITAMIN D3) 50 MCG (2000 UT) TABS Take 1 tablet by mouth daily. 90 tablet 2  . Coenzyme Q-10 200 MG CAPS 1-2 capsules daily for cardiovascular health    . DULoxetine (CYMBALTA) 60 MG capsule Take 2 capsules (120 mg total) by mouth daily. 025 capsule 1  . folic acid (FOLVITE) 1 MG tablet Take 1 mg by mouth daily.    Marland Kitchen glucose blood (FREESTYLE LITE)  test strip Use to test 1-2 times daily. 100 each 5  . hydrochlorothiazide (HYDRODIURIL) 25 MG tablet TAKE 1 TABLET(25 MG) BY MOUTH DAILY 90 tablet 1  . Ibuprofen-Famotidine 800-26.6 MG TABS Take 1 tablet by mouth 3 (three) times daily as needed. 90 tablet 3  . metFORMIN (GLUCOPHAGE) 500 MG tablet TAKE 1 TABLET(500 MG) BY MOUTH TWICE DAILY 180 tablet 0  . Multiple Vitamin (MULTI-VITAMINS) TABS Take 1 tablet by mouth daily.    . vitamin E 400 UNIT capsule Take 1 capsule by mouth daily.    . ARIPiprazole (ABILIFY) 15 MG tablet Take by mouth.    . gabapentin (NEURONTIN) 100 MG capsule Take 2 at night, one in the morning and one late afternoon. (Patient not taking: Reported on 10/31/2020) 40 capsule 0  . QUEtiapine (SEROQUEL) 50 MG tablet Take by mouth.    . Triamcinolone Acetonide (TRIAMCINOLONE IN ABSORBASE) 0.05 % OINT Apply to lesions in mouth daily. (Patient not taking:  Reported on 10/31/2020) 30 g 0  . varenicline (CHANTIX CONTINUING MONTH PAK) 1 MG tablet Take 1 tablet (1 mg total) by mouth 2 (two) times daily. (Patient not taking: No sig reported) 60 tablet 5   No facility-administered medications prior to visit.    Allergies  Allergen Reactions  . Metronidazole Rash    ROS Review of Systems  Constitutional: Negative.   HENT: Negative.   Eyes: Negative for photophobia and visual disturbance.  Respiratory: Negative.   Cardiovascular: Negative.   Gastrointestinal: Negative.   Endocrine: Negative for polyphagia and polyuria.  Genitourinary: Negative.   Musculoskeletal: Negative for gait problem and myalgias.  Neurological: Negative for speech difficulty and weakness.  Psychiatric/Behavioral: Negative.       Objective:    Physical Exam Vitals and nursing note reviewed.  Constitutional:      General: She is not in acute distress.    Appearance: Normal appearance. She is not ill-appearing or toxic-appearing.  HENT:     Head: Normocephalic and atraumatic.     Right Ear: Tympanic membrane, ear canal and external ear normal.     Left Ear: Tympanic membrane, ear canal and external ear normal.  Eyes:     General: No scleral icterus.    Extraocular Movements: Extraocular movements intact.     Conjunctiva/sclera: Conjunctivae normal.     Pupils: Pupils are equal, round, and reactive to light.  Cardiovascular:     Rate and Rhythm: Normal rate and regular rhythm.  Pulmonary:     Effort: Pulmonary effort is normal.     Breath sounds: Normal breath sounds.  Abdominal:     General: Bowel sounds are normal.  Musculoskeletal:     Cervical back: No rigidity or tenderness.     Right lower leg: No edema.     Left lower leg: No edema.  Lymphadenopathy:     Cervical: No cervical adenopathy.  Neurological:     General: No focal deficit present.     Mental Status: She is alert and oriented to person, place, and time.  Psychiatric:        Mood and  Affect: Mood normal.        Behavior: Behavior normal.     BP (!) 144/68   Pulse (!) 110   Temp (!) 97.4 F (36.3 C) (Temporal)   Ht 5\' 4"  (1.626 m)   Wt 213 lb (96.6 kg)   SpO2 97%   BMI 36.56 kg/m  Wt Readings from Last 3 Encounters:  10/31/20 213 lb (96.6  kg)  08/23/20 219 lb (99.3 kg)  07/21/20 219 lb (99.3 kg)     Health Maintenance Due  Topic Date Due  . PAP SMEAR-Modifier  10/09/2020  . HEMOGLOBIN A1C  10/30/2020    There are no preventive care reminders to display for this patient.  Lab Results  Component Value Date   TSH 1.93 10/23/2017   Lab Results  Component Value Date   WBC 11.2 (H) 05/02/2020   HGB 14.9 05/02/2020   HCT 44.8 05/02/2020   MCV 90.0 05/02/2020   PLT 381.0 05/02/2020   Lab Results  Component Value Date   NA 138 05/02/2020   K 4.2 05/02/2020   CO2 28 05/02/2020   GLUCOSE 103 (H) 05/02/2020   BUN 11 05/02/2020   CREATININE 0.75 05/02/2020   BILITOT 0.4 05/02/2020   ALKPHOS 82 05/02/2020   AST 16 05/02/2020   ALT 20 05/02/2020   PROT 7.0 05/02/2020   ALBUMIN 4.8 05/02/2020   CALCIUM 10.3 05/02/2020   GFR 77.92 05/02/2020   Lab Results  Component Value Date   CHOL 183 10/26/2019   Lab Results  Component Value Date   HDL 44.70 10/26/2019   Lab Results  Component Value Date   LDLCALC 98 10/24/2018   Lab Results  Component Value Date   TRIG 217.0 (H) 10/26/2019   Lab Results  Component Value Date   CHOLHDL 4 10/26/2019   Lab Results  Component Value Date   HGBA1C 7.0 (H) 05/02/2020      Assessment & Plan:   Problem List Items Addressed This Visit      Cardiovascular and Mediastinum   Essential hypertension - Primary   Relevant Orders   CBC   Comprehensive metabolic panel   Urinalysis, Routine w reflex microscopic     Endocrine   Controlled type 2 diabetes mellitus without complication, without long-term current use of insulin (HCC)   Relevant Orders   Comprehensive metabolic panel   Hemoglobin A1c      Other   Elevated cholesterol   Relevant Orders   Comprehensive metabolic panel   Lipid panel      No orders of the defined types were placed in this encounter.   Follow-up: Return in about 6 months (around 05/03/2021).   Continue weight loss efforts. Return fasting for labs. Continue current meds. May adjust pending labs. Please try to have pap smear done.  Libby Maw, MD

## 2020-10-31 NOTE — Patient Instructions (Signed)
Exercising to Stay Healthy To become healthy and stay healthy, it is recommended that you do moderate-intensity and vigorous-intensity exercise. You can tell that you are exercising at a moderate intensity if your heart starts beating faster and you start breathing faster but can still hold a conversation. You can tell that you are exercising at a vigorous intensity if you are breathing much harder and faster and cannot hold a conversation while exercising. Exercising regularly is important. It has many health benefits, such as:  Improving overall fitness, flexibility, and endurance.  Increasing bone density.  Helping with weight control.  Decreasing body fat.  Increasing muscle strength.  Reducing stress and tension.  Improving overall health. How often should I exercise? Choose an activity that you enjoy, and set realistic goals. Your health care provider can help you make an activity plan that works for you. Exercise regularly as told by your health care provider. This may include:  Doing strength training two times a week, such as: ? Lifting weights. ? Using resistance bands. ? Push-ups. ? Sit-ups. ? Yoga.  Doing a certain intensity of exercise for a given amount of time. Choose from these options: ? A total of 150 minutes of moderate-intensity exercise every week. ? A total of 75 minutes of vigorous-intensity exercise every week. ? A mix of moderate-intensity and vigorous-intensity exercise every week. Children, pregnant women, people who have not exercised regularly, people who are overweight, and older adults may need to talk with a health care provider about what activities are safe to do. If you have a medical condition, be sure to talk with your health care provider before you start a new exercise program. What are some exercise ideas? Moderate-intensity exercise ideas include:  Walking 1 mile (1.6 km) in about 15  minutes.  Biking.  Hiking.  Golfing.  Dancing.  Water aerobics. Vigorous-intensity exercise ideas include:  Walking 4.5 miles (7.2 km) or more in about 1 hour.  Jogging or running 5 miles (8 km) in about 1 hour.  Biking 10 miles (16.1 km) or more in about 1 hour.  Lap swimming.  Roller-skating or in-line skating.  Cross-country skiing.  Vigorous competitive sports, such as football, basketball, and soccer.  Jumping rope.  Aerobic dancing.   What are some everyday activities that can help me to get exercise?  Yard work, such as: ? Pushing a lawn mower. ? Raking and bagging leaves.  Washing your car.  Pushing a stroller.  Shoveling snow.  Gardening.  Washing windows or floors. How can I be more active in my day-to-day activities?  Use stairs instead of an elevator.  Take a walk during your lunch break.  If you drive, park your car farther away from your work or school.  If you take public transportation, get off one stop early and walk the rest of the way.  Stand up or walk around during all of your indoor phone calls.  Get up, stretch, and walk around every 30 minutes throughout the day.  Enjoy exercise with a friend. Support to continue exercising will help you keep a regular routine of activity. What guidelines can I follow while exercising?  Before you start a new exercise program, talk with your health care provider.  Do not exercise so much that you hurt yourself, feel dizzy, or get very short of breath.  Wear comfortable clothes and wear shoes with good support.  Drink plenty of water while you exercise to prevent dehydration or heat stroke.  Work out until   your breathing and your heartbeat get faster. Where to find more information  [place] Department of Health and Human Services: www.hhs.gov  Centers for Disease Control and Prevention (CDC): www.cdc.gov Summary  Exercising regularly is important. It will improve your overall fitness,  flexibility, and endurance.  Regular exercise also will improve your overall health. It can help you control your weight, reduce stress, and improve your bone density.  Do not exercise so much that you hurt yourself, feel dizzy, or get very short of breath.  Before you start a new exercise program, talk with your health care provider. This information is not intended to replace advice given to you by your health care provider. Make sure you discuss any questions you have with your health care provider. Document Revised: 07/19/2017 Document Reviewed: 06/27/2017 Elsevier Patient Education  2021 [employer]  

## 2020-11-01 ENCOUNTER — Other Ambulatory Visit (INDEPENDENT_AMBULATORY_CARE_PROVIDER_SITE_OTHER): Payer: Federal, State, Local not specified - PPO

## 2020-11-01 DIAGNOSIS — I10 Essential (primary) hypertension: Secondary | ICD-10-CM | POA: Diagnosis not present

## 2020-11-01 DIAGNOSIS — E119 Type 2 diabetes mellitus without complications: Secondary | ICD-10-CM

## 2020-11-01 DIAGNOSIS — E7800 Pure hypercholesterolemia, unspecified: Secondary | ICD-10-CM | POA: Diagnosis not present

## 2020-11-01 LAB — LIPID PANEL
Cholesterol: 181 mg/dL (ref 0–200)
HDL: 42.7 mg/dL (ref >39.00)
NonHDL: 138.51
Total CHOL/HDL Ratio: 4
Triglycerides: 321 mg/dL — ABNORMAL HIGH (ref 0.0–149.0)
VLDL: 64.2 mg/dL — ABNORMAL HIGH (ref 0.0–40.0)

## 2020-11-01 LAB — CBC
HCT: 41.3 % (ref 36.0–46.0)
Hemoglobin: 14 g/dL (ref 12.0–15.0)
MCHC: 33.9 g/dL (ref 30.0–36.0)
MCV: 89.8 fl (ref 78.0–100.0)
Platelets: 371 10*3/uL (ref 150.0–400.0)
RBC: 4.6 Mil/uL (ref 3.87–5.11)
RDW: 13.7 % (ref 11.5–15.5)
WBC: 8.7 10*3/uL (ref 4.0–10.5)

## 2020-11-01 LAB — COMPREHENSIVE METABOLIC PANEL
ALT: 24 U/L (ref 0–35)
AST: 18 U/L (ref 0–37)
Albumin: 4.4 g/dL (ref 3.5–5.2)
Alkaline Phosphatase: 84 U/L (ref 39–117)
BUN: 11 mg/dL (ref 6–23)
CO2: 29 mEq/L (ref 19–32)
Calcium: 9.6 mg/dL (ref 8.4–10.5)
Chloride: 102 mEq/L (ref 96–112)
Creatinine, Ser: 0.75 mg/dL (ref 0.40–1.20)
GFR: 84.37 mL/min (ref >60.00)
Glucose, Bld: 114 mg/dL — ABNORMAL HIGH (ref 70–99)
Potassium: 4.8 mEq/L (ref 3.5–5.1)
Sodium: 139 mEq/L (ref 135–145)
Total Bilirubin: 0.3 mg/dL (ref 0.2–1.2)
Total Protein: 6.8 g/dL (ref 6.0–8.3)

## 2020-11-01 LAB — URINALYSIS, ROUTINE W REFLEX MICROSCOPIC
Bilirubin Urine: NEGATIVE
Hgb urine dipstick: NEGATIVE
Ketones, ur: NEGATIVE
Leukocytes,Ua: NEGATIVE
Nitrite: NEGATIVE
RBC / HPF: NONE SEEN (ref 0–?)
Specific Gravity, Urine: >=1.03 — AB (ref 1.000–1.030)
Total Protein, Urine: NEGATIVE
Urine Glucose: NEGATIVE
Urobilinogen, UA: 0.2 (ref 0.0–1.0)
WBC, UA: NONE SEEN (ref 0–?)
pH: 5.5 (ref 5.0–8.0)

## 2020-11-01 LAB — HEMOGLOBIN A1C: Hgb A1c MFr Bld: 6.8 % — ABNORMAL HIGH (ref 4.6–6.5)

## 2020-11-01 LAB — LDL CHOLESTEROL, DIRECT: Direct LDL: 96 mg/dL

## 2020-11-13 ENCOUNTER — Other Ambulatory Visit: Payer: Self-pay | Admitting: Family Medicine

## 2020-11-13 DIAGNOSIS — E119 Type 2 diabetes mellitus without complications: Secondary | ICD-10-CM

## 2020-11-13 DIAGNOSIS — E7800 Pure hypercholesterolemia, unspecified: Secondary | ICD-10-CM

## 2020-12-08 DIAGNOSIS — F909 Attention-deficit hyperactivity disorder, unspecified type: Secondary | ICD-10-CM | POA: Diagnosis not present

## 2020-12-08 DIAGNOSIS — Z79899 Other long term (current) drug therapy: Secondary | ICD-10-CM | POA: Diagnosis not present

## 2020-12-08 DIAGNOSIS — F329 Major depressive disorder, single episode, unspecified: Secondary | ICD-10-CM | POA: Diagnosis not present

## 2021-02-11 ENCOUNTER — Other Ambulatory Visit: Payer: Self-pay | Admitting: Family Medicine

## 2021-02-11 DIAGNOSIS — E119 Type 2 diabetes mellitus without complications: Secondary | ICD-10-CM

## 2021-03-06 DIAGNOSIS — Z79899 Other long term (current) drug therapy: Secondary | ICD-10-CM | POA: Diagnosis not present

## 2021-03-06 DIAGNOSIS — F909 Attention-deficit hyperactivity disorder, unspecified type: Secondary | ICD-10-CM | POA: Diagnosis not present

## 2021-03-06 DIAGNOSIS — F329 Major depressive disorder, single episode, unspecified: Secondary | ICD-10-CM | POA: Diagnosis not present

## 2021-03-12 ENCOUNTER — Other Ambulatory Visit: Payer: Self-pay | Admitting: Family

## 2021-03-12 DIAGNOSIS — I10 Essential (primary) hypertension: Secondary | ICD-10-CM

## 2021-03-22 ENCOUNTER — Other Ambulatory Visit: Payer: Self-pay | Admitting: Family

## 2021-03-22 DIAGNOSIS — I10 Essential (primary) hypertension: Secondary | ICD-10-CM

## 2021-03-22 DIAGNOSIS — E119 Type 2 diabetes mellitus without complications: Secondary | ICD-10-CM

## 2021-03-28 DIAGNOSIS — M545 Low back pain, unspecified: Secondary | ICD-10-CM | POA: Diagnosis not present

## 2021-03-28 DIAGNOSIS — M25511 Pain in right shoulder: Secondary | ICD-10-CM | POA: Diagnosis not present

## 2021-03-28 DIAGNOSIS — M542 Cervicalgia: Secondary | ICD-10-CM | POA: Diagnosis not present

## 2021-03-28 DIAGNOSIS — M25512 Pain in left shoulder: Secondary | ICD-10-CM | POA: Diagnosis not present

## 2021-03-30 DIAGNOSIS — M25512 Pain in left shoulder: Secondary | ICD-10-CM | POA: Diagnosis not present

## 2021-03-30 DIAGNOSIS — M545 Low back pain, unspecified: Secondary | ICD-10-CM | POA: Diagnosis not present

## 2021-03-30 DIAGNOSIS — M542 Cervicalgia: Secondary | ICD-10-CM | POA: Diagnosis not present

## 2021-03-30 DIAGNOSIS — M25511 Pain in right shoulder: Secondary | ICD-10-CM | POA: Diagnosis not present

## 2021-04-04 ENCOUNTER — Telehealth: Payer: Self-pay | Admitting: Family Medicine

## 2021-04-04 DIAGNOSIS — I10 Essential (primary) hypertension: Secondary | ICD-10-CM

## 2021-04-04 DIAGNOSIS — E119 Type 2 diabetes mellitus without complications: Secondary | ICD-10-CM

## 2021-04-04 MED ORDER — HYDROCHLOROTHIAZIDE 25 MG PO TABS: ORAL_TABLET | ORAL | 1 refills | Status: DC

## 2021-04-04 MED ORDER — CANDESARTAN CILEXETIL 32 MG PO TABS: ORAL_TABLET | ORAL | 1 refills | Status: DC

## 2021-04-04 NOTE — Telephone Encounter (Signed)
What is the name of the medication? hydrochlorothiazide (HYDRODIURIL) 25 MG tablet [id number]  and candesartan (ATACAND) 32 MG tablet [id number]  Have you contacted your pharmacy to request a refill? Pt is needing refills with these scripts, she only has 2 days left.   Which pharmacy would you like this sent to? [phone] - [place], [place] - [address] AT [address]  [address], [place] [address]  Phone:  [phone]  Fax:  [phone]  DEA #:  [id number]     Patient notified that their request is being sent to the clinical staff for review and that they should receive a call once it is complete. If they do not receive a call within 72 hours they can check with their pharmacy or our office.

## 2021-04-04 NOTE — Telephone Encounter (Signed)
Refill sent in

## 2021-04-05 DIAGNOSIS — M542 Cervicalgia: Secondary | ICD-10-CM | POA: Diagnosis not present

## 2021-04-05 DIAGNOSIS — M545 Low back pain, unspecified: Secondary | ICD-10-CM | POA: Diagnosis not present

## 2021-04-05 DIAGNOSIS — M25511 Pain in right shoulder: Secondary | ICD-10-CM | POA: Diagnosis not present

## 2021-04-05 DIAGNOSIS — M25512 Pain in left shoulder: Secondary | ICD-10-CM | POA: Diagnosis not present

## 2021-04-06 DIAGNOSIS — H01115 Allergic dermatitis of left lower eyelid: Secondary | ICD-10-CM | POA: Diagnosis not present

## 2021-04-06 DIAGNOSIS — H01112 Allergic dermatitis of right lower eyelid: Secondary | ICD-10-CM | POA: Diagnosis not present

## 2021-04-06 DIAGNOSIS — H01114 Allergic dermatitis of left upper eyelid: Secondary | ICD-10-CM | POA: Diagnosis not present

## 2021-04-06 DIAGNOSIS — H01111 Allergic dermatitis of right upper eyelid: Secondary | ICD-10-CM | POA: Diagnosis not present

## 2021-04-10 DIAGNOSIS — M25511 Pain in right shoulder: Secondary | ICD-10-CM | POA: Diagnosis not present

## 2021-04-10 DIAGNOSIS — M25512 Pain in left shoulder: Secondary | ICD-10-CM | POA: Diagnosis not present

## 2021-04-10 DIAGNOSIS — M545 Low back pain, unspecified: Secondary | ICD-10-CM | POA: Diagnosis not present

## 2021-04-10 DIAGNOSIS — M542 Cervicalgia: Secondary | ICD-10-CM | POA: Diagnosis not present

## 2021-04-19 DIAGNOSIS — M25512 Pain in left shoulder: Secondary | ICD-10-CM | POA: Diagnosis not present

## 2021-04-19 DIAGNOSIS — M542 Cervicalgia: Secondary | ICD-10-CM | POA: Diagnosis not present

## 2021-04-19 DIAGNOSIS — M545 Low back pain, unspecified: Secondary | ICD-10-CM | POA: Diagnosis not present

## 2021-04-19 DIAGNOSIS — M25511 Pain in right shoulder: Secondary | ICD-10-CM | POA: Diagnosis not present

## 2021-04-21 DIAGNOSIS — M25511 Pain in right shoulder: Secondary | ICD-10-CM | POA: Diagnosis not present

## 2021-04-21 DIAGNOSIS — M545 Low back pain, unspecified: Secondary | ICD-10-CM | POA: Diagnosis not present

## 2021-04-21 DIAGNOSIS — M25512 Pain in left shoulder: Secondary | ICD-10-CM | POA: Diagnosis not present

## 2021-04-21 DIAGNOSIS — M542 Cervicalgia: Secondary | ICD-10-CM | POA: Diagnosis not present

## 2021-04-26 DIAGNOSIS — M542 Cervicalgia: Secondary | ICD-10-CM | POA: Diagnosis not present

## 2021-04-26 DIAGNOSIS — M25511 Pain in right shoulder: Secondary | ICD-10-CM | POA: Diagnosis not present

## 2021-04-26 DIAGNOSIS — M25512 Pain in left shoulder: Secondary | ICD-10-CM | POA: Diagnosis not present

## 2021-04-26 DIAGNOSIS — M545 Low back pain, unspecified: Secondary | ICD-10-CM | POA: Diagnosis not present

## 2021-04-28 ENCOUNTER — Encounter: Payer: Self-pay | Admitting: Family Medicine

## 2021-04-28 ENCOUNTER — Other Ambulatory Visit: Payer: Self-pay

## 2021-04-28 ENCOUNTER — Ambulatory Visit: Payer: Federal, State, Local not specified - PPO | Admitting: Family Medicine

## 2021-04-28 VITALS — BP 142/70 | HR 107 | Temp 98.0°F | Ht 64.0 in | Wt 197.6 lb

## 2021-04-28 DIAGNOSIS — Z0000 Encounter for general adult medical examination without abnormal findings: Secondary | ICD-10-CM

## 2021-04-28 DIAGNOSIS — I10 Essential (primary) hypertension: Secondary | ICD-10-CM

## 2021-04-28 DIAGNOSIS — E7800 Pure hypercholesterolemia, unspecified: Secondary | ICD-10-CM

## 2021-04-28 DIAGNOSIS — Z23 Encounter for immunization: Secondary | ICD-10-CM

## 2021-04-28 DIAGNOSIS — E119 Type 2 diabetes mellitus without complications: Secondary | ICD-10-CM

## 2021-04-28 NOTE — Progress Notes (Signed)
Established Patient Office Visit  Subjective:  Patient ID: Paula Kelly, female    DOB: 1955-11-10  Age: 65 y.o. MRN: [id number]  CC:  Chief Complaint  Patient presents with   Follow-up    Follow up no concerns.     HPI Paula Kelly presents for follow up of diabetes, hypertension and elevated cholesterol.  She has been able to lose some weight.  She has been stressed recently.  Daughter had weight loss surgery in [place] and there were complications.  Daughter is doing better.  Patient is staying active gardening around her condominium.  Continues to smoke.  Compliant with all of her medications.  Past Medical History:  Diagnosis Date   Allergy    seasonal   Arthritis    Cataract    Depression    Diabetes mellitus without complication ([place])    H/O fibromyalgia    had for over 10 years   Hyperlipidemia    Hypertension    Post-operative nausea and vomiting     Past Surgical History:  Procedure Laterality Date   APPENDECTOMY     COLONOSCOPY     kidney stone removal  2013   used a basket extraction    Family History  Problem Relation Age of Onset   Diabetes Mother    Heart disease Father    Emphysema Father    CVA Sister    AAA (abdominal aortic aneurysm) Brother     Social History   Socioeconomic History   Marital status: Married    Spouse name: Not on file   Number of children: Not on file   Years of education: Not on file   Highest education level: Not on file  Occupational History   Not on file  Tobacco Use   Smoking status: Every Day    Packs/day: 1.00    Years: 47.00    Pack years: 47.00    Types: Cigarettes   Smokeless tobacco: Never  Substance and Sexual Activity   Alcohol use: No   Drug use: No   Sexual activity: Not on file  Other Topics Concern   Not on file  Social History Narrative   Not on file   Social Determinants of Health   Financial Resource Strain: Not on file  Food Insecurity: Not on file  Transportation  Needs: Not on file  Physical Activity: Not on file  Stress: Not on file  Social Connections: Not on file  Intimate Partner Violence: Not on file    Outpatient Medications Prior to Visit  Medication Sig Dispense Refill   amphetamine-dextroamphetamine (ADDERALL XR) 30 MG 24 hr capsule Will start this prescription once the 20 mg prescription has run out.     Ascorbic Acid (VITAMIN C) 100 MG tablet Take 100 mg by mouth daily.     aspirin EC 81 MG tablet Take 1 tablet (81 mg total) by mouth daily. 365 tablet 0   atorvastatin (LIPITOR) 40 MG tablet TAKE 1 TABLET(40 MG) BY MOUTH DAILY 100 tablet 1   candesartan (ATACAND) 32 MG tablet TAKE 1 TABLET(32 MG) BY MOUTH DAILY 100 tablet 1   Cholecalciferol (VITAMIN D3) 50 MCG (2000 UT) TABS Take 1 tablet by mouth daily. 90 tablet 2   Cyanocobalamin (VITAMIN B 12 PO) Take by mouth.     DULoxetine (CYMBALTA) 60 MG capsule Take 2 capsules (120 mg total) by mouth daily. 99991111 capsule 1   folic acid (FOLVITE) 1 MG tablet Take 1 mg by mouth daily.  glucose blood (FREESTYLE LITE) test strip Use to test 1-2 times daily. 100 each 5   hydrochlorothiazide (HYDRODIURIL) 25 MG tablet TAKE 1 TABLET(25 MG) BY MOUTH DAILY 90 tablet 1   metFORMIN (GLUCOPHAGE) 500 MG tablet TAKE 1 TABLET(500 MG) BY MOUTH TWICE DAILY 180 tablet 0   Multiple Vitamin (MULTI-VITAMINS) TABS Take 1 tablet by mouth daily.     vitamin E 400 UNIT capsule Take 1 capsule by mouth daily.     Ibuprofen-Famotidine 800-26.6 MG TABS Take 1 tablet by mouth 3 (three) times daily as needed. 90 tablet 3   ARIPiprazole (ABILIFY) 15 MG tablet Take by mouth.     Coenzyme Q-10 200 MG CAPS 1-2 capsules daily for cardiovascular health (Patient not taking: Reported on 04/28/2021)     QUEtiapine (SEROQUEL) 50 MG tablet Take by mouth. (Patient not taking: Reported on 04/28/2021)     gabapentin (NEURONTIN) 100 MG capsule Take 2 at night, one in the morning and one late afternoon. (Patient not taking: No sig reported) 40  capsule 0   Triamcinolone Acetonide (TRIAMCINOLONE IN ABSORBASE) 0.05 % OINT Apply to lesions in mouth daily. (Patient not taking: Reported on 10/31/2020) 30 g 0   varenicline (CHANTIX CONTINUING MONTH PAK) 1 MG tablet Take 1 tablet (1 mg total) by mouth 2 (two) times daily. (Patient not taking: No sig reported) 60 tablet 5   No facility-administered medications prior to visit.    Allergies  Allergen Reactions   Metronidazole Rash    ROS Review of Systems  Constitutional:  Negative for chills, diaphoresis, fatigue, fever and unexpected weight change.  HENT: Negative.    Eyes:  Negative for photophobia and visual disturbance.  Respiratory: Negative.    Cardiovascular: Negative.   Gastrointestinal: Negative.   Endocrine: Negative for polyphagia and polyuria.  Genitourinary: Negative.   Neurological:  Negative for speech difficulty and weakness.     Objective:    Physical Exam Vitals and nursing note reviewed.  Constitutional:      General: She is not in acute distress.    Appearance: Normal appearance. She is not ill-appearing, toxic-appearing or diaphoretic.  HENT:     Head: Normocephalic and atraumatic.     Right Ear: Tympanic membrane, ear canal and external ear normal.     Left Ear: Tympanic membrane, ear canal and external ear normal.     Mouth/Throat:     Mouth: Mucous membranes are moist.     Pharynx: Oropharynx is clear. No oropharyngeal exudate or posterior oropharyngeal erythema.  Eyes:     General: No scleral icterus.       Right eye: No discharge.        Left eye: No discharge.     Extraocular Movements: Extraocular movements intact.     Conjunctiva/sclera: Conjunctivae normal.     Pupils: Pupils are equal, round, and reactive to light.  Neck:     Vascular: No carotid bruit.  Cardiovascular:     Rate and Rhythm: Normal rate and regular rhythm.  Pulmonary:     Effort: Pulmonary effort is normal.     Breath sounds: Normal breath sounds.  Abdominal:      General: Bowel sounds are normal.  Musculoskeletal:     Cervical back: No rigidity or tenderness.  Lymphadenopathy:     Cervical: No cervical adenopathy.  Skin:    General: Skin is warm and dry.  Neurological:     Mental Status: She is alert and oriented to person, place, and time.  Psychiatric:  Mood and Affect: Mood normal.        Behavior: Behavior normal.    BP (!) 142/70 (BP Location: Right Arm, Patient Position: Sitting, Cuff Size: Large)   Pulse (!) 107   Temp 98 F (36.7 C) (Temporal)   Ht '5\' 4"'$  (1.626 m)   Wt 197 lb 9.6 oz (89.6 kg)   SpO2 98%   BMI 33.92 kg/m  Wt Readings from Last 3 Encounters:  04/28/21 197 lb 9.6 oz (89.6 kg)  10/31/20 213 lb (96.6 kg)  08/23/20 219 lb (99.3 kg)     Health Maintenance Due  Topic Date Due   Zoster Vaccines- Shingrix (1 of 2) Never done   Pneumococcal Vaccine 71-15 Years old (2 - PCV) 07/25/2018   PAP SMEAR-Modifier  10/09/2020   INFLUENZA VACCINE  03/20/2021    There are no preventive care reminders to display for this patient.  Lab Results  Component Value Date   TSH 1.93 10/23/2017   Lab Results  Component Value Date   WBC 8.7 11/01/2020   HGB 14.0 11/01/2020   HCT 41.3 11/01/2020   MCV 89.8 11/01/2020   PLT 371.0 11/01/2020   Lab Results  Component Value Date   NA 139 11/01/2020   K 4.8 11/01/2020   CO2 29 11/01/2020   GLUCOSE 114 (H) 11/01/2020   BUN 11 11/01/2020   CREATININE 0.75 11/01/2020   BILITOT 0.3 11/01/2020   ALKPHOS 84 11/01/2020   AST 18 11/01/2020   ALT 24 11/01/2020   PROT 6.8 11/01/2020   ALBUMIN 4.4 11/01/2020   CALCIUM 9.6 11/01/2020   GFR 84.37 11/01/2020   Lab Results  Component Value Date   CHOL 181 11/01/2020   Lab Results  Component Value Date   HDL 42.70 11/01/2020   Lab Results  Component Value Date   LDLCALC 98 10/24/2018   Lab Results  Component Value Date   TRIG 321.0 (H) 11/01/2020   Lab Results  Component Value Date   CHOLHDL 4 11/01/2020   Lab  Results  Component Value Date   HGBA1C 6.8 (H) 11/01/2020      Assessment & Plan:   Problem List Items Addressed This Visit       Cardiovascular and Mediastinum   Essential hypertension - Primary   Relevant Orders   Comprehensive metabolic panel   Microalbumin / creatinine urine ratio   Urinalysis, Routine w reflex microscopic   CBC     Endocrine   Controlled type 2 diabetes mellitus without complication, without long-term current use of insulin (HCC)   Relevant Orders   Comprehensive metabolic panel   Hemoglobin A1c   Microalbumin / creatinine urine ratio   Urinalysis, Routine w reflex microscopic     Other   Elevated cholesterol   Relevant Orders   Comprehensive metabolic panel   LDL cholesterol, direct   Lipid panel   Other Visit Diagnoses     Healthcare maintenance       Relevant Orders   Flu Vaccine QUAD 6+ mos PF IM (Fluarix Quad PF)       No orders of the defined types were placed in this encounter. Discussed of blood pressure goal of less than 140 over less than 90.  She will have her daughter start checking her blood pressures.  Believe that it should trend lower with her continued weight loss.  Discussed smoking cessation and advised her to continue to try to quit.  Information given about preventing high cholesterol, the Shingrix vaccine and managing  hypertension.  Follow-up: Return in about 6 months (around 10/26/2021), or if symptoms worsen or fail to improve.    Libby Maw, MD

## 2021-05-01 DIAGNOSIS — M25511 Pain in right shoulder: Secondary | ICD-10-CM | POA: Diagnosis not present

## 2021-05-01 DIAGNOSIS — M545 Low back pain, unspecified: Secondary | ICD-10-CM | POA: Diagnosis not present

## 2021-05-01 DIAGNOSIS — M25512 Pain in left shoulder: Secondary | ICD-10-CM | POA: Diagnosis not present

## 2021-05-01 DIAGNOSIS — M542 Cervicalgia: Secondary | ICD-10-CM | POA: Diagnosis not present

## 2021-05-02 ENCOUNTER — Other Ambulatory Visit (INDEPENDENT_AMBULATORY_CARE_PROVIDER_SITE_OTHER): Payer: Federal, State, Local not specified - PPO

## 2021-05-02 ENCOUNTER — Other Ambulatory Visit: Payer: Self-pay

## 2021-05-02 DIAGNOSIS — E119 Type 2 diabetes mellitus without complications: Secondary | ICD-10-CM

## 2021-05-02 DIAGNOSIS — E7800 Pure hypercholesterolemia, unspecified: Secondary | ICD-10-CM

## 2021-05-02 DIAGNOSIS — I10 Essential (primary) hypertension: Secondary | ICD-10-CM

## 2021-05-02 DIAGNOSIS — M25511 Pain in right shoulder: Secondary | ICD-10-CM | POA: Diagnosis not present

## 2021-05-02 DIAGNOSIS — M542 Cervicalgia: Secondary | ICD-10-CM | POA: Diagnosis not present

## 2021-05-02 DIAGNOSIS — M25512 Pain in left shoulder: Secondary | ICD-10-CM | POA: Diagnosis not present

## 2021-05-02 DIAGNOSIS — M545 Low back pain, unspecified: Secondary | ICD-10-CM | POA: Diagnosis not present

## 2021-05-02 LAB — URINALYSIS, ROUTINE W REFLEX MICROSCOPIC
Bilirubin Urine: NEGATIVE
Hgb urine dipstick: NEGATIVE
Ketones, ur: NEGATIVE
Leukocytes,Ua: NEGATIVE
Nitrite: NEGATIVE
Specific Gravity, Urine: <=1.005 — AB (ref 1.000–1.030)
Total Protein, Urine: NEGATIVE
Urine Glucose: NEGATIVE
Urobilinogen, UA: 0.2 (ref 0.0–1.0)
pH: 6.5 (ref 5.0–8.0)

## 2021-05-02 LAB — COMPREHENSIVE METABOLIC PANEL
ALT: 20 U/L (ref 0–35)
AST: 17 U/L (ref 0–37)
Albumin: 4.9 g/dL (ref 3.5–5.2)
Alkaline Phosphatase: 82 U/L (ref 39–117)
BUN: 16 mg/dL (ref 6–23)
CO2: 29 mEq/L (ref 19–32)
Calcium: 10.5 mg/dL (ref 8.4–10.5)
Chloride: 98 mEq/L (ref 96–112)
Creatinine, Ser: 0.84 mg/dL (ref 0.40–1.20)
GFR: 73.39 mL/min (ref >60.00)
Glucose, Bld: 80 mg/dL (ref 70–99)
Potassium: 4.1 mEq/L (ref 3.5–5.1)
Sodium: 139 mEq/L (ref 135–145)
Total Bilirubin: 0.5 mg/dL (ref 0.2–1.2)
Total Protein: 7.5 g/dL (ref 6.0–8.3)

## 2021-05-02 LAB — LIPID PANEL
Cholesterol: 165 mg/dL (ref 0–200)
HDL: 44.9 mg/dL (ref >39.00)
NonHDL: 120.36
Total CHOL/HDL Ratio: 4
Triglycerides: 244 mg/dL — ABNORMAL HIGH (ref 0.0–149.0)
VLDL: 48.8 mg/dL — ABNORMAL HIGH (ref 0.0–40.0)

## 2021-05-02 LAB — CBC
HCT: 42.7 % (ref 36.0–46.0)
Hemoglobin: 14.1 g/dL (ref 12.0–15.0)
MCHC: 33.1 g/dL (ref 30.0–36.0)
MCV: 91 fl (ref 78.0–100.0)
Platelets: 384 10*3/uL (ref 150.0–400.0)
RBC: 4.7 Mil/uL (ref 3.87–5.11)
RDW: 13.9 % (ref 11.5–15.5)
WBC: 10.1 10*3/uL (ref 4.0–10.5)

## 2021-05-02 LAB — MICROALBUMIN / CREATININE URINE RATIO
Creatinine,U: 59 mg/dL
Microalb Creat Ratio: 1.2 mg/g (ref 0.0–30.0)
Microalb, Ur: <0.7 mg/dL (ref 0.0–1.9)

## 2021-05-02 LAB — HEMOGLOBIN A1C: Hgb A1c MFr Bld: 6.7 % — ABNORMAL HIGH (ref 4.6–6.5)

## 2021-05-02 LAB — LDL CHOLESTEROL, DIRECT: Direct LDL: 88 mg/dL

## 2021-05-02 NOTE — Addendum Note (Signed)
Addended by: Lynnea Ferrier on: 05/02/2021 08:08 AM   Modules accepted: Orders

## 2021-05-04 DIAGNOSIS — M25511 Pain in right shoulder: Secondary | ICD-10-CM | POA: Diagnosis not present

## 2021-05-04 DIAGNOSIS — M25512 Pain in left shoulder: Secondary | ICD-10-CM | POA: Diagnosis not present

## 2021-05-04 DIAGNOSIS — M545 Low back pain, unspecified: Secondary | ICD-10-CM | POA: Diagnosis not present

## 2021-05-04 DIAGNOSIS — M542 Cervicalgia: Secondary | ICD-10-CM | POA: Diagnosis not present

## 2021-05-04 MED ORDER — ATORVASTATIN CALCIUM 80 MG PO TABS: 80.0000 mg | ORAL_TABLET | Freq: Every day | ORAL | 3 refills | Status: DC

## 2021-05-04 NOTE — Addendum Note (Signed)
Addended by: Jon Billings on: 05/04/2021 05:05 PM   Modules accepted: Orders

## 2021-05-17 ENCOUNTER — Other Ambulatory Visit: Payer: Self-pay | Admitting: Family Medicine

## 2021-05-17 DIAGNOSIS — E7800 Pure hypercholesterolemia, unspecified: Secondary | ICD-10-CM

## 2021-05-17 DIAGNOSIS — E119 Type 2 diabetes mellitus without complications: Secondary | ICD-10-CM

## 2021-05-29 DIAGNOSIS — F909 Attention-deficit hyperactivity disorder, unspecified type: Secondary | ICD-10-CM | POA: Diagnosis not present

## 2021-05-29 DIAGNOSIS — F329 Major depressive disorder, single episode, unspecified: Secondary | ICD-10-CM | POA: Diagnosis not present

## 2021-05-29 DIAGNOSIS — Z79899 Other long term (current) drug therapy: Secondary | ICD-10-CM | POA: Diagnosis not present

## 2021-06-21 DIAGNOSIS — M25512 Pain in left shoulder: Secondary | ICD-10-CM | POA: Diagnosis not present

## 2021-06-21 DIAGNOSIS — M542 Cervicalgia: Secondary | ICD-10-CM | POA: Diagnosis not present

## 2021-06-21 DIAGNOSIS — M545 Low back pain, unspecified: Secondary | ICD-10-CM | POA: Diagnosis not present

## 2021-06-21 DIAGNOSIS — M25511 Pain in right shoulder: Secondary | ICD-10-CM | POA: Diagnosis not present

## 2021-07-21 DIAGNOSIS — H40033 Anatomical narrow angle, bilateral: Secondary | ICD-10-CM | POA: Diagnosis not present

## 2021-07-21 DIAGNOSIS — H16223 Keratoconjunctivitis sicca, not specified as Sjogren's, bilateral: Secondary | ICD-10-CM | POA: Diagnosis not present

## 2021-07-21 DIAGNOSIS — H43813 Vitreous degeneration, bilateral: Secondary | ICD-10-CM | POA: Diagnosis not present

## 2021-07-21 DIAGNOSIS — H2513 Age-related nuclear cataract, bilateral: Secondary | ICD-10-CM | POA: Diagnosis not present

## 2021-07-24 LAB — HM DIABETES EYE EXAM

## 2021-08-15 ENCOUNTER — Other Ambulatory Visit: Payer: Self-pay | Admitting: Family Medicine

## 2021-08-15 DIAGNOSIS — I10 Essential (primary) hypertension: Secondary | ICD-10-CM

## 2021-08-16 ENCOUNTER — Encounter: Payer: Self-pay | Admitting: Family Medicine

## 2021-08-29 DIAGNOSIS — F909 Attention-deficit hyperactivity disorder, unspecified type: Secondary | ICD-10-CM | POA: Diagnosis not present

## 2021-08-29 DIAGNOSIS — Z79899 Other long term (current) drug therapy: Secondary | ICD-10-CM | POA: Diagnosis not present

## 2021-08-29 DIAGNOSIS — F329 Major depressive disorder, single episode, unspecified: Secondary | ICD-10-CM | POA: Diagnosis not present

## 2021-08-30 ENCOUNTER — Ambulatory Visit (INDEPENDENT_AMBULATORY_CARE_PROVIDER_SITE_OTHER): Payer: Federal, State, Local not specified - PPO

## 2021-08-30 ENCOUNTER — Ambulatory Visit: Payer: Federal, State, Local not specified - PPO | Admitting: Family Medicine

## 2021-08-30 ENCOUNTER — Encounter: Payer: Self-pay | Admitting: Family Medicine

## 2021-08-30 ENCOUNTER — Other Ambulatory Visit: Payer: Self-pay

## 2021-08-30 VITALS — BP 134/70 | HR 96 | Temp 96.0°F | Ht 64.0 in | Wt 197.0 lb

## 2021-08-30 DIAGNOSIS — R5383 Other fatigue: Secondary | ICD-10-CM

## 2021-08-30 DIAGNOSIS — R634 Abnormal weight loss: Secondary | ICD-10-CM | POA: Diagnosis not present

## 2021-08-30 DIAGNOSIS — R5381 Other malaise: Secondary | ICD-10-CM | POA: Diagnosis not present

## 2021-08-30 DIAGNOSIS — R058 Other specified cough: Secondary | ICD-10-CM | POA: Insufficient documentation

## 2021-08-30 DIAGNOSIS — S76011A Strain of muscle, fascia and tendon of right hip, initial encounter: Secondary | ICD-10-CM | POA: Diagnosis not present

## 2021-08-30 DIAGNOSIS — Z6379 Other stressful life events affecting family and household: Secondary | ICD-10-CM

## 2021-08-30 DIAGNOSIS — F339 Major depressive disorder, recurrent, unspecified: Secondary | ICD-10-CM | POA: Insufficient documentation

## 2021-08-30 DIAGNOSIS — M76899 Other specified enthesopathies of unspecified lower limb, excluding foot: Secondary | ICD-10-CM | POA: Insufficient documentation

## 2021-08-30 DIAGNOSIS — R059 Cough, unspecified: Secondary | ICD-10-CM | POA: Diagnosis not present

## 2021-08-30 DIAGNOSIS — R0981 Nasal congestion: Secondary | ICD-10-CM

## 2021-08-30 LAB — CBC
HCT: 40.5 % (ref 36.0–46.0)
Hemoglobin: 13.1 g/dL (ref 12.0–15.0)
MCHC: 32.5 g/dL (ref 30.0–36.0)
MCV: 89.8 fl (ref 78.0–100.0)
Platelets: 359 10*3/uL (ref 150.0–400.0)
RBC: 4.51 Mil/uL (ref 3.87–5.11)
RDW: 13.7 % (ref 11.5–15.5)
WBC: 11.1 10*3/uL — ABNORMAL HIGH (ref 4.0–10.5)

## 2021-08-30 LAB — URINALYSIS, ROUTINE W REFLEX MICROSCOPIC
Bilirubin Urine: NEGATIVE
Hgb urine dipstick: NEGATIVE
Ketones, ur: NEGATIVE
Leukocytes,Ua: NEGATIVE
Nitrite: NEGATIVE
RBC / HPF: NONE SEEN (ref 0–?)
Specific Gravity, Urine: 1.01 (ref 1.000–1.030)
Total Protein, Urine: NEGATIVE
Urine Glucose: NEGATIVE
Urobilinogen, UA: 0.2 (ref 0.0–1.0)
WBC, UA: NONE SEEN (ref 0–?)
pH: 6 (ref 5.0–8.0)

## 2021-08-30 LAB — TSH: TSH: 2.13 u[IU]/mL (ref 0.35–5.50)

## 2021-08-30 LAB — BASIC METABOLIC PANEL
BUN: 14 mg/dL (ref 6–23)
CO2: 28 mEq/L (ref 19–32)
Calcium: 9.9 mg/dL (ref 8.4–10.5)
Chloride: 102 mEq/L (ref 96–112)
Creatinine, Ser: 0.76 mg/dL (ref 0.40–1.20)
GFR: 82.56 mL/min (ref >60.00)
Glucose, Bld: 109 mg/dL — ABNORMAL HIGH (ref 70–99)
Potassium: 4.3 mEq/L (ref 3.5–5.1)
Sodium: 139 mEq/L (ref 135–145)

## 2021-08-30 MED ORDER — MOMETASONE FUROATE 50 MCG/ACT NA SUSP: 2.0000 | Freq: Every day | NASAL | 12 refills | Status: DC

## 2021-08-30 MED ORDER — PREDNISONE 10 MG PO TABS: 10.0000 mg | ORAL_TABLET | Freq: Two times a day (BID) | ORAL | 0 refills | Status: AC

## 2021-08-30 NOTE — Progress Notes (Signed)
Established Patient Office Visit  Subjective:  Patient ID: Paula Kelly, female    DOB: 03/30/1956  Age: 66 y.o. MRN: [id number]  CC:  Chief Complaint  Patient presents with   Nausea    Nauseous, fatigue no feeling well x few months.     HPI Paula Kelly presents for evaluation of a 2 to 29-month history of fatigue, malaise.  There has been some weight loss.  Denies night sweats.  She has ongoing nasal congestion with postnasal drip and a dry nonproductive cough.  Denies tightness or wheezing in the chest.  There is no fever or chills.  Phlegm or rhinorrhea.  No facial pressure or teeth pain.  Denies chest pain or difficulty breathing.  There has been no change in her bowel habits.  Denies any problems with urination other than ongoing stress incontinence.  She has been in physical therapy for this issue but has been unable to go.  It is stressful for her at home.  Her daughter and her 18-year-old grandson are currently living with her.  Her daughter has bipolar disorder and can be difficult.  Reminds me that her daughter had flown to [place] for gastric bypass surgery.  There were complications and she remained in the hospital from this past October through November.  She is doing better now.  Feels unappreciated.  Depression has increased.  She is seeing her psychiatrist about this and therapy for talking is pending.  Pulled a muscle in her right buttock after tripping on a piece of carpet at her home.  It is slowly improving.  Up-to-date on health maintenance.  Is due for Pap smear.  Past Medical History:  Diagnosis Date   Allergy    seasonal   Arthritis    Cataract    Depression    Diabetes mellitus without complication (HCC)    H/O fibromyalgia    had for over 10 years   Hyperlipidemia    Hypertension    Post-operative nausea and vomiting     Past Surgical History:  Procedure Laterality Date   APPENDECTOMY     COLONOSCOPY     kidney stone removal  2013   used a  basket extraction    Family History  Problem Relation Age of Onset   Diabetes Mother    Heart disease Father    Emphysema Father    CVA Sister    AAA (abdominal aortic aneurysm) Brother     Social History   Socioeconomic History   Marital status: Married    Spouse name: Not on file   Number of children: Not on file   Years of education: Not on file   Highest education level: Not on file  Occupational History   Not on file  Tobacco Use   Smoking status: Every Day    Packs/day: 1.00    Years: 47.00    Pack years: 47.00    Types: Cigarettes   Smokeless tobacco: Never  Substance and Sexual Activity   Alcohol use: No   Drug use: No   Sexual activity: Not on file  Other Topics Concern   Not on file  Social History Narrative   Not on file   Social Determinants of Health   Financial Resource Strain: Not on file  Food Insecurity: Not on file  Transportation Needs: Not on file  Physical Activity: Not on file  Stress: Not on file  Social Connections: Not on file  Intimate Partner Violence: Not on file  Outpatient Medications Prior to Visit  Medication Sig Dispense Refill   amphetamine-dextroamphetamine (ADDERALL XR) 30 MG 24 hr capsule Will start this prescription once the 20 mg prescription has run out.     Ascorbic Acid (VITAMIN C) 100 MG tablet Take 100 mg by mouth daily.     aspirin EC 81 MG tablet Take 1 tablet (81 mg total) by mouth daily. 365 tablet 0   atorvastatin (LIPITOR) 80 MG tablet Take 1 tablet (80 mg total) by mouth daily. 90 tablet 3   candesartan (ATACAND) 32 MG tablet TAKE 1 TABLET(32 MG) BY MOUTH DAILY 100 tablet 1   Cholecalciferol (VITAMIN D3) 50 MCG (2000 UT) TABS Take 1 tablet by mouth daily. 90 tablet 2   Cyanocobalamin (VITAMIN B 12 PO) Take by mouth.     DULoxetine (CYMBALTA) 60 MG capsule Take 2 capsules (120 mg total) by mouth daily. 470 capsule 1   folic acid (FOLVITE) 1 MG tablet Take 1 mg by mouth daily.     glucose blood (FREESTYLE  LITE) test strip Use to test 1-2 times daily. 100 each 5   hydrochlorothiazide (HYDRODIURIL) 25 MG tablet TAKE 1 TABLET(25 MG) BY MOUTH DAILY 90 tablet 1   metFORMIN (GLUCOPHAGE) 500 MG tablet TAKE 1 TABLET(500 MG) BY MOUTH TWICE DAILY 180 tablet 2   Multiple Vitamin (MULTI-VITAMINS) TABS Take 1 tablet by mouth daily.     QUEtiapine (SEROQUEL) 25 MG tablet Take 25 mg by mouth at bedtime.     vitamin E 400 UNIT capsule Take 1 capsule by mouth daily.     QUEtiapine (SEROQUEL) 50 MG tablet Take by mouth.     ARIPiprazole (ABILIFY) 15 MG tablet Take by mouth.     Coenzyme Q-10 200 MG CAPS 1-2 capsules daily for cardiovascular health (Patient not taking: Reported on 04/28/2021)     No facility-administered medications prior to visit.    Allergies  Allergen Reactions   Metronidazole Rash    ROS Review of Systems  Constitutional:  Positive for fatigue. Negative for chills, diaphoresis, fever and unexpected weight change.  HENT:  Positive for congestion and postnasal drip. Negative for rhinorrhea, sinus pressure and sinus pain.   Eyes:  Negative for photophobia and visual disturbance.  Respiratory:  Positive for cough. Negative for shortness of breath and wheezing.   Cardiovascular:  Negative for chest pain.  Gastrointestinal:  Negative for blood in stool, constipation, diarrhea and vomiting.  Genitourinary:  Negative for difficulty urinating, dysuria, frequency and urgency.  Musculoskeletal:  Positive for myalgias.  Skin:  Negative for pallor and rash.  Neurological:  Negative for speech difficulty and weakness.     Depression screen [facility] 2/9 08/30/2021 04/28/2021 10/31/2020  Decreased Interest 0 0 0  Down, Depressed, Hopeless 1 0 0  PHQ - 2 Score 1 0 0     Objective:    Physical Exam Constitutional:      General: She is not in acute distress.    Appearance: Normal appearance. She is not ill-appearing, toxic-appearing or diaphoretic.  HENT:     Head: Normocephalic and atraumatic.      Right Ear: Tympanic membrane, ear canal and external ear normal.     Left Ear: Tympanic membrane, ear canal and external ear normal.     Mouth/Throat:     Mouth: Mucous membranes are moist.     Pharynx: Oropharynx is clear. No oropharyngeal exudate or posterior oropharyngeal erythema.  Eyes:     General: No scleral icterus.  Right eye: No discharge.        Left eye: No discharge.     Extraocular Movements: Extraocular movements intact.     Conjunctiva/sclera: Conjunctivae normal.     Pupils: Pupils are equal, round, and reactive to light.  Neck:     Vascular: No carotid bruit.  Cardiovascular:     Rate and Rhythm: Normal rate and regular rhythm.  Pulmonary:     Effort: Pulmonary effort is normal. No respiratory distress.     Breath sounds: Normal breath sounds. No wheezing or rales.  Abdominal:     General: Bowel sounds are normal.  Musculoskeletal:     Cervical back: No rigidity or tenderness.  Lymphadenopathy:     Cervical: No cervical adenopathy.  Skin:    General: Skin is warm and dry.  Neurological:     Mental Status: She is alert and oriented to person, place, and time.  Psychiatric:        Mood and Affect: Mood normal.        Behavior: Behavior normal.    BP 134/70 (BP Location: Right Arm, Patient Position: Sitting, Cuff Size: Normal)    Pulse 96    Temp (!) 96 F (35.6 C) (Temporal)    Ht 5\' 4"  (1.626 m)    Wt 197 lb (89.4 kg)    SpO2 97%    BMI 33.81 kg/m  Wt Readings from Last 3 Encounters:  08/30/21 197 lb (89.4 kg)  04/28/21 197 lb 9.6 oz (89.6 kg)  10/31/20 213 lb (96.6 kg)     Health Maintenance Due  Topic Date Due   Zoster Vaccines- Shingrix (1 of 2) Never done   Pneumococcal Vaccine 15-53 Years old (2 - PCV) 07/25/2018   PAP SMEAR-Modifier  10/09/2020   FOOT EXAM  05/02/2021    There are no preventive care reminders to display for this patient.  Lab Results  Component Value Date   TSH 1.93 10/23/2017   Lab Results  Component Value Date    WBC 10.1 05/02/2021   HGB 14.1 05/02/2021   HCT 42.7 05/02/2021   MCV 91.0 05/02/2021   PLT 384.0 05/02/2021   Lab Results  Component Value Date   NA 139 05/02/2021   K 4.1 05/02/2021   CO2 29 05/02/2021   GLUCOSE 80 05/02/2021   BUN 16 05/02/2021   CREATININE 0.84 05/02/2021   BILITOT 0.5 05/02/2021   ALKPHOS 82 05/02/2021   AST 17 05/02/2021   ALT 20 05/02/2021   PROT 7.5 05/02/2021   ALBUMIN 4.9 05/02/2021   CALCIUM 10.5 05/02/2021   GFR 73.39 05/02/2021   Lab Results  Component Value Date   CHOL 165 05/02/2021   Lab Results  Component Value Date   HDL 44.90 05/02/2021   Lab Results  Component Value Date   LDLCALC 98 10/24/2018   Lab Results  Component Value Date   TRIG 244.0 (H) 05/02/2021   Lab Results  Component Value Date   CHOLHDL 4 05/02/2021   Lab Results  Component Value Date   HGBA1C 6.7 (H) 05/02/2021      Assessment & Plan:   Problem List Items Addressed This Visit       Respiratory   Post-viral cough syndrome - Primary   Relevant Medications   predniSONE (DELTASONE) 10 MG tablet   Other Relevant Orders   CBC   DG Chest 2 View     Musculoskeletal and Integument   Muscle strain of right gluteal region   Relevant  Medications   predniSONE (DELTASONE) 10 MG tablet     Other   Malaise   Relevant Orders   CBC   Other fatigue   Relevant Orders   Basic metabolic panel   CBC   TSH   Urinalysis, Routine w reflex microscopic   Stress due to illness of family member   Other Visit Diagnoses     Nasal congestion       Relevant Medications   mometasone (NASONEX) 50 MCG/ACT nasal spray   predniSONE (DELTASONE) 10 MG tablet       Meds ordered this encounter  Medications   mometasone (NASONEX) 50 MCG/ACT nasal spray    Sig: Place 2 sprays into the nose daily.    Dispense:  1 each    Refill:  12   predniSONE (DELTASONE) 10 MG tablet    Sig: Take 1 tablet (10 mg total) by mouth 2 (two) times daily with a meal for 7 days.     Dispense:  14 tablet    Refill:  0    Follow-up: Return in about 3 months (around 11/28/2021), or if symptoms worsen or fail to improve, for schedule follow up Gyn check with provider. .  Information was given on fatigue.  Encouraged her to pursue counseling as directed by psychiatry and see her GYN provider.  Follow-up in 3 months or sooner if worse or other problems develop.  Libby Maw, MD

## 2021-09-05 IMAGING — CT CT CHEST LUNG CANCER SCREENING LOW DOSE W/O CM
1 series · 10 of 10 positions shown, 13 images · non-contrast
Comparison: 08/06/2017

CLINICAL DATA: 64-year-old female with 47 pack-year history of
smoking. Lung cancer screening.

EXAM:
CT CHEST WITHOUT CONTRAST LOW-DOSE FOR LUNG CANCER SCREENING
TECHNIQUE: Multidetector CT imaging of the chest was performed following the
standard protocol without IV contrast.

[ct lung segmentation data · axial · 0.69mm/px · z∈[-350,-350]mm · 10 of 336 frames shown]
[frame 1/336  mediastinal]
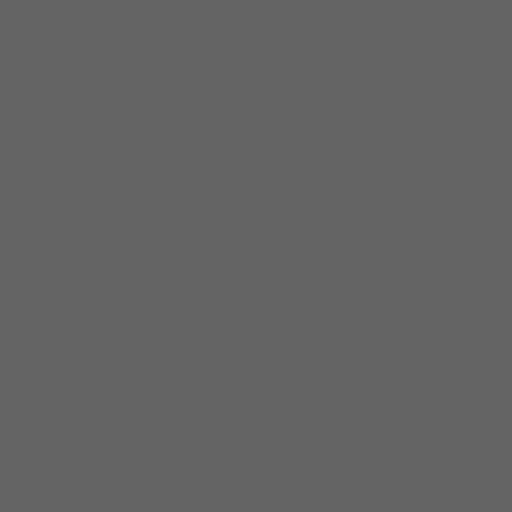
[frame 1/336  lung]
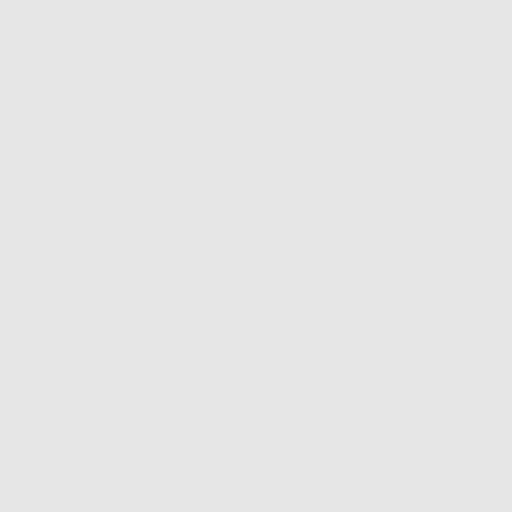
[frame 38/336  lung]
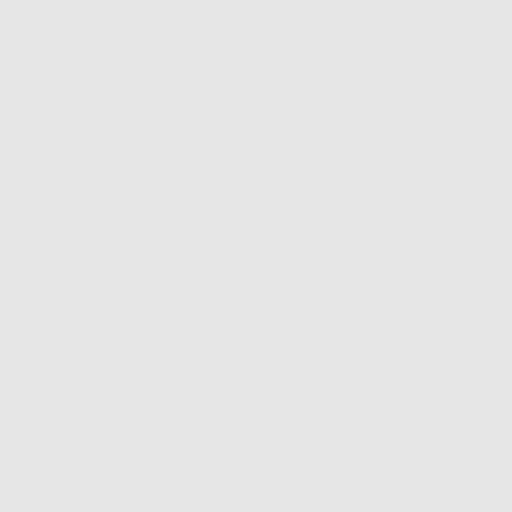
[frame 75/336  lung]
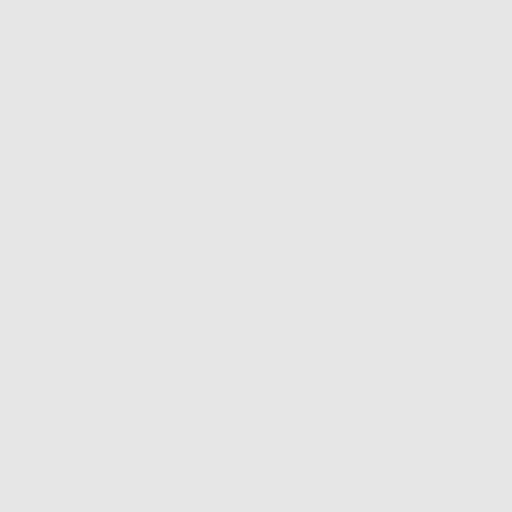
[frame 112/336  lung]
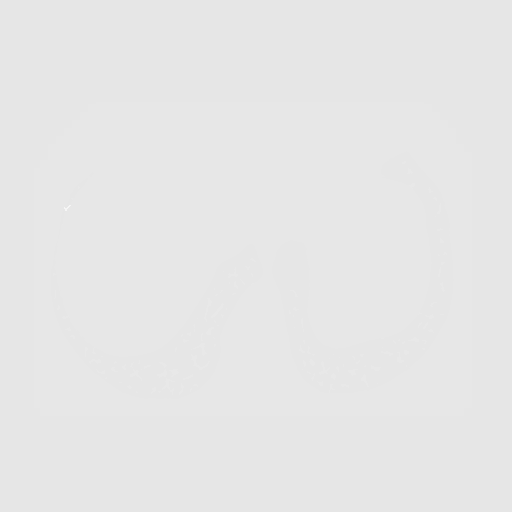
[frame 149/336  mediastinal]
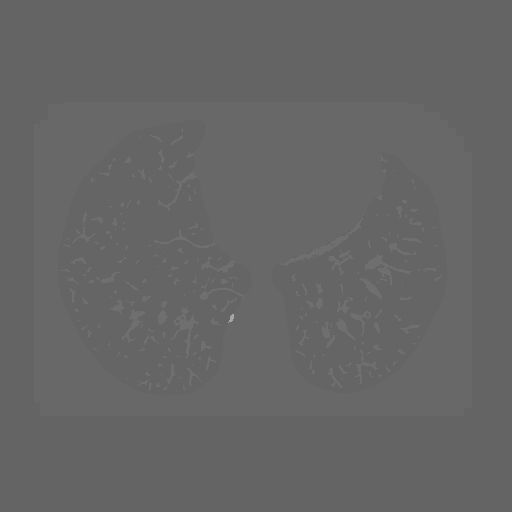
[frame 149/336  lung]
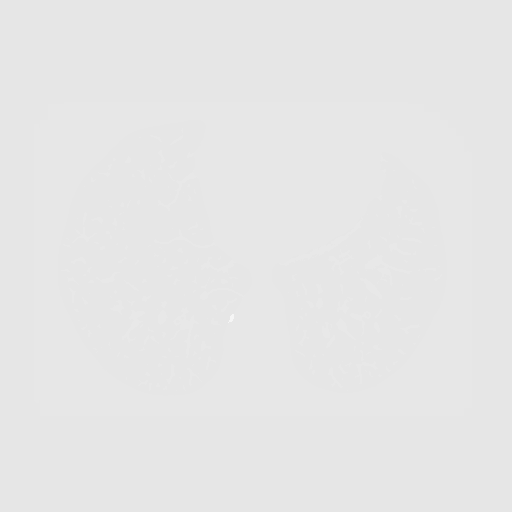
[frame 187/336  lung]
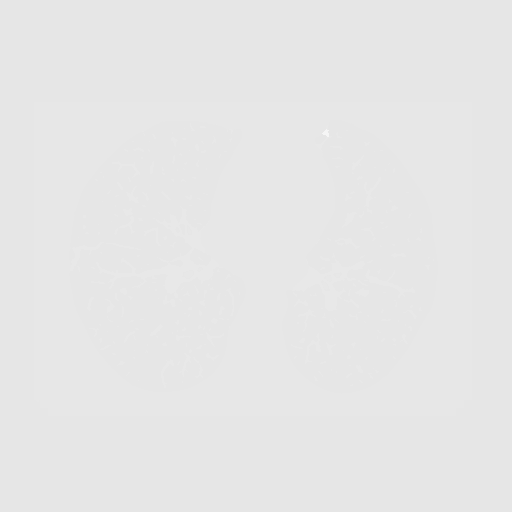
[frame 224/336  lung]
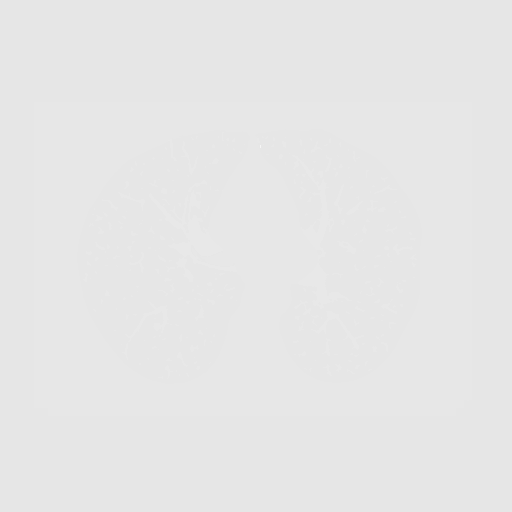
[frame 261/336  lung]
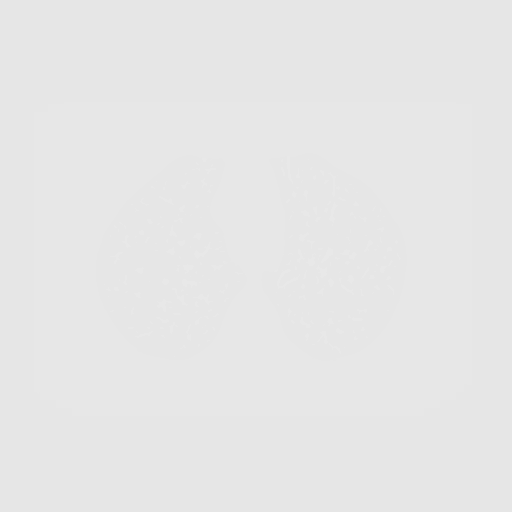
[frame 298/336  mediastinal]
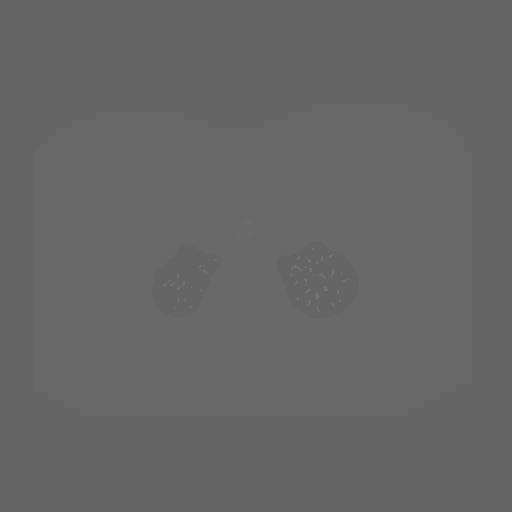
[frame 298/336  lung]
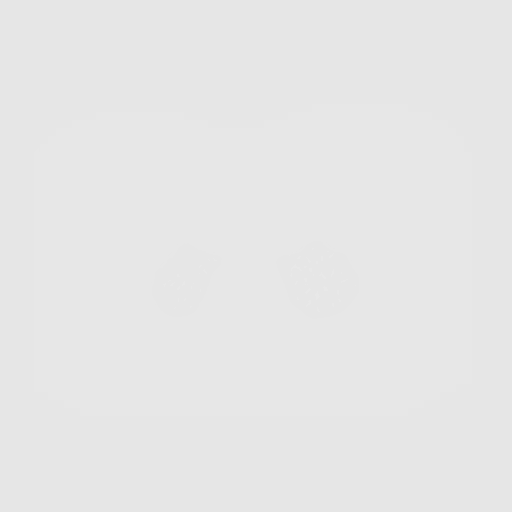
[frame 336/336  lung]
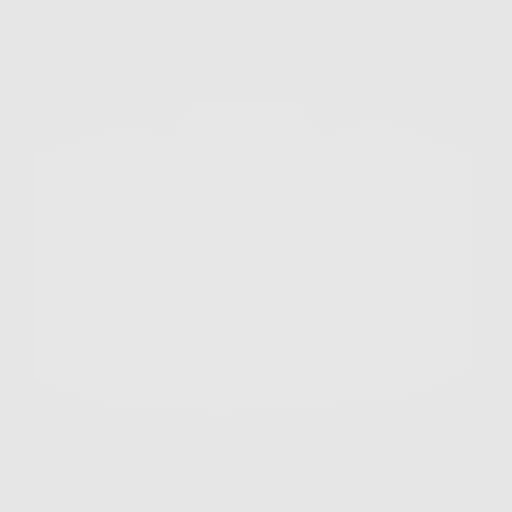

[10 of 10 positions shown; findings below may reference images not displayed]

FINDINGS: Cardiovascular: The heart size is normal. No substantial pericardial
effusion. Atherosclerotic calcification is noted in the wall of the
thoracic aorta.

Mediastinum/Nodes: No mediastinal lymphadenopathy. No evidence for
gross hilar lymphadenopathy although assessment is limited by the
lack of intravenous contrast on today's study. The esophagus has
normal imaging features. There is no axillary lymphadenopathy.

Lungs/Pleura: Centrilobular emphsyema noted. Similar to previous,
there are scattered areas of atelectasis and/or scarring. No
suspicious pulmonary nodule or mass. No focal airspace
consolidation. Tiny left lower lobe nodule measures 2.3 mm.

Upper Abdomen: Unremarkable.

Musculoskeletal: No worrisome lytic or sclerotic osseous
abnormality.
IMPRESSION: 1. Lung-RADS 2, benign appearance or behavior. Continue annual
screening with low-dose chest CT without contrast in 12 months.
2.  Emphysema (TMUKZ-8YM.U) and Aortic Atherosclerosis (TMUKZ-170.0)

## 2021-10-10 DIAGNOSIS — F909 Attention-deficit hyperactivity disorder, unspecified type: Secondary | ICD-10-CM | POA: Diagnosis not present

## 2021-10-10 DIAGNOSIS — F329 Major depressive disorder, single episode, unspecified: Secondary | ICD-10-CM | POA: Diagnosis not present

## 2021-10-10 DIAGNOSIS — Z79899 Other long term (current) drug therapy: Secondary | ICD-10-CM | POA: Diagnosis not present

## 2021-10-26 ENCOUNTER — Ambulatory Visit: Payer: Federal, State, Local not specified - PPO | Admitting: Family Medicine

## 2021-11-06 ENCOUNTER — Ambulatory Visit (INDEPENDENT_AMBULATORY_CARE_PROVIDER_SITE_OTHER): Payer: Medicare Other

## 2021-11-06 ENCOUNTER — Other Ambulatory Visit: Payer: Self-pay

## 2021-11-06 ENCOUNTER — Ambulatory Visit: Payer: Medicare Other | Admitting: Family Medicine

## 2021-11-06 ENCOUNTER — Encounter: Payer: Self-pay | Admitting: Family Medicine

## 2021-11-06 ENCOUNTER — Ambulatory Visit: Payer: Federal, State, Local not specified - PPO | Admitting: Family Medicine

## 2021-11-06 VITALS — BP 150/78 | HR 108 | Temp 97.7°F | Ht 64.0 in | Wt 194.6 lb

## 2021-11-06 DIAGNOSIS — G5701 Lesion of sciatic nerve, right lower limb: Secondary | ICD-10-CM | POA: Diagnosis not present

## 2021-11-06 DIAGNOSIS — S86912A Strain of unspecified muscle(s) and tendon(s) at lower leg level, left leg, initial encounter: Secondary | ICD-10-CM

## 2021-11-06 DIAGNOSIS — Z23 Encounter for immunization: Secondary | ICD-10-CM | POA: Diagnosis not present

## 2021-11-06 DIAGNOSIS — M25562 Pain in left knee: Secondary | ICD-10-CM | POA: Diagnosis not present

## 2021-11-06 MED ORDER — DICLOFENAC SODIUM 1 % EX GEL: CUTANEOUS | 1 refills | Status: AC

## 2021-11-06 NOTE — Progress Notes (Signed)
? ?Established Patient Office Visit ? ?Subjective:  ?Patient ID: Paula Kelly, female    DOB: Mar 06, 1956  Age: 66 y.o. MRN: [id number] ? ?CC:  ?Chief Complaint  ?Patient presents with  ? Knee Pain  ?  Pt. C/o  left knee pain x 4 days ago. Rt buttocks pain x 3 months.  ? ? ?HPI ?[name] presents for evaluation of left knee pain and right buttock pain.  The pain occurred after a dog ran into the lateral side of her left knee and knocked her down.  No prior history of injury to the left knee.  She believes that she has been told that there is arthritis in this knee.  24-monthhistory of pain in her right buttock.  Pain can be exacerbated by dorsi flexion and extension of her right foot.  Denies ongoing lower back pain.  There is no numbness or tingling.  No weakness. ? ?Past Medical History:  ?Diagnosis Date  ? Allergy   ? seasonal  ? Arthritis   ? Cataract   ? Depression   ? [place]   ? H/O fibromyalgia   ? had for over 10 years  ? Hyperlipidemia   ? Hypertension   ? Post-operative nausea and vomiting   ? ? ?Past Surgical History:  ?Procedure Laterality Date  ? APPENDECTOMY    ? COLONOSCOPY    ? kidney stone removal  2013  ? used a basket extraction  ? ? ?Family History  ?Problem Relation Age of Onset  ? Diabetes Mother   ? Heart disease Father   ? Emphysema Father   ? CVA Sister   ? AAA (abdominal aortic aneurysm) Brother   ? ? ?Social History  ? ?Socioeconomic History  ? Marital status: Married  ?  Spouse name: Not on file  ? Number of children: Not on file  ? Years of education: Not on file  ? Highest education level: Not on file  ?Occupational History  ? Not on file  ?Tobacco Use  ? Smoking status: Every Day  ?  Packs/day: 1.00  ?  Years: 47.00  ?  Pack years: 47.00  ?  Types: Cigarettes  ? Smokeless tobacco: Never  ?Substance and Sexual Activity  ? Alcohol use: No  ? Drug use: No  ? Sexual activity: Not on file  ?Other Topics Concern  ? Not on file  ?Social  History Narrative  ? Not on file  ? ?Social Determinants of Health  ? ?Financial Resource Strain: Not on file  ?Food Insecurity: Not on file  ?Transportation Needs: Not on file  ?Physical Activity: Not on file  ?Stress: Not on file  ?Social Connections: Not on file  ?Intimate Partner Violence: Not on file  ? ? ?Outpatient Medications Prior to Visit  ?Medication Sig Dispense Refill  ? buPROPion (WELLBUTRIN XL) 150 MG 24 hr tablet Take 1 tablet by mouth daily.    ? amphetamine-dextroamphetamine (ADDERALL XR) 30 MG 24 hr capsule Will start this prescription once the 20 mg prescription has run out.    ? Ascorbic Acid (VITAMIN C) 100 MG tablet Take 100 mg by mouth daily.    ? aspirin EC 81 MG tablet Take 1 tablet (81 mg total) by mouth daily. 365 tablet 0  ? atorvastatin (LIPITOR) 80 MG tablet Take 1 tablet (80 mg total) by mouth daily. 90 tablet 3  ? candesartan (ATACAND) 32 MG tablet TAKE 1 TABLET(32 MG) BY MOUTH DAILY 100 tablet 1  ?  Cholecalciferol (VITAMIN D3) 50 MCG (2000 UT) TABS Take 1 tablet by mouth daily. 90 tablet 2  ? Cyanocobalamin (VITAMIN B 12 PO) Take by mouth.    ? DULoxetine (CYMBALTA) 60 MG capsule Take 2 capsules (120 mg total) by mouth daily. 180 capsule 1  ? folic acid (FOLVITE) 1 MG tablet Take 1 mg by mouth daily.    ? glucose blood (FREESTYLE LITE) test strip Use to test 1-2 times daily. 100 each 5  ? hydrochlorothiazide (HYDRODIURIL) 25 MG tablet TAKE 1 TABLET(25 MG) BY MOUTH DAILY 90 tablet 1  ? metFORMIN (GLUCOPHAGE) 500 MG tablet TAKE 1 TABLET(500 MG) BY MOUTH TWICE DAILY 180 tablet 2  ? mometasone (NASONEX) 50 MCG/ACT nasal spray Place 2 sprays into the nose daily. 1 each 12  ? Multiple Vitamin (MULTI-VITAMINS) TABS Take 1 tablet by mouth daily.    ? QUEtiapine (SEROQUEL) 25 MG tablet Take 25 mg by mouth at bedtime.    ? vitamin E 400 UNIT capsule Take 1 capsule by mouth daily.    ? ?No facility-administered medications prior to visit.  ? ? ?Allergies  ?Allergen Reactions  ? Metronidazole  Rash  ? ? ?ROS ?Review of Systems  ?Constitutional: Negative.   ?Respiratory: Negative.    ?Cardiovascular: Negative.   ?Gastrointestinal: Negative.   ?Musculoskeletal:  Positive for arthralgias and joint swelling. Negative for back pain.  ?Neurological:  Negative for weakness and numbness.  ? ?  ?Objective:  ?  ?Physical Exam ?Vitals and nursing note reviewed.  ?Constitutional:   ?   General: She is not in acute distress. ?   Appearance: Normal appearance. She is not ill-appearing, toxic-appearing or diaphoretic.  ?HENT:  ?   Head: Normocephalic and atraumatic.  ?   Right Ear: External ear normal.  ?   Left Ear: External ear normal.  ?Eyes:  ?   General: No scleral icterus.    ?   Right eye: No discharge.     ?   Left eye: No discharge.  ?   Extraocular Movements: Extraocular movements intact.  ?   Conjunctiva/sclera: Conjunctivae normal.  ?Pulmonary:  ?   Effort: Pulmonary effort is normal.  ?Musculoskeletal:  ?   Lumbar back: No tenderness. Normal range of motion (pain in left knee with flexion.). Negative right straight leg raise test and negative left straight leg raise test.  ?   Left knee: Swelling and ecchymosis present. No effusion. Tenderness present over the medial joint line.  ?   Comments: Weak positive grind test of left knee.  ?Neurological:  ?   Mental Status: She is alert and oriented to person, place, and time.  ?   Motor: No weakness.  ?Psychiatric:     ?   Mood and Affect: Mood normal.     ?   Behavior: Behavior normal.  ? ? ?BP (!) 150/78 (BP Location: Left Arm, Patient Position: Sitting, Cuff Size: Large)   Pulse (!) 108   Temp 97.7 ?F (36.5 ?C) (Temporal)   Ht '5\' 4"'$  (1.626 m)   Wt 194 lb 9.6 oz (88.3 kg)   SpO2 98%   BMI 33.40 kg/m?  ?Wt Readings from Last 3 Encounters:  ?11/06/21 194 lb 9.6 oz (88.3 kg)  ?08/30/21 197 lb (89.4 kg)  ?04/28/21 197 lb 9.6 oz (89.6 kg)  ? ? ? ?Health Maintenance Due  ?Topic Date Due  ? Pneumonia Vaccine 21+ Years old (2 - PCV) 07/25/2018  ? PAP  SMEAR-Modifier  10/09/2020  ? FOOT  EXAM  05/02/2021  ? MAMMOGRAM  11/05/2021  ? HEMOGLOBIN A1C  10/30/2021  ? ? ?There are no preventive care reminders to display for this patient. ? ?Lab Results  ?Component Value Date  ? TSH 2.13 08/30/2021  ? ?Lab Results  ?Component Value Date  ? WBC 11.1 (H) 08/30/2021  ? HGB 13.1 08/30/2021  ? HCT 40.5 08/30/2021  ? MCV 89.8 08/30/2021  ? PLT 359.0 08/30/2021  ? ?Lab Results  ?Component Value Date  ? NA 139 08/30/2021  ? K 4.3 08/30/2021  ? CO2 28 08/30/2021  ? GLUCOSE 109 (H) 08/30/2021  ? BUN 14 08/30/2021  ? CREATININE 0.76 08/30/2021  ? BILITOT 0.5 05/02/2021  ? ALKPHOS 82 05/02/2021  ? AST 17 05/02/2021  ? ALT 20 05/02/2021  ? PROT 7.5 05/02/2021  ? ALBUMIN 4.9 05/02/2021  ? CALCIUM 9.9 08/30/2021  ? GFR 82.56 08/30/2021  ? ?Lab Results  ?Component Value Date  ? CHOL 165 05/02/2021  ? ?Lab Results  ?Component Value Date  ? HDL 44.90 05/02/2021  ? ?Lab Results  ?Component Value Date  ? [place] 98 10/24/2018  ? ?Lab Results  ?Component Value Date  ? TRIG 244.0 (H) 05/02/2021  ? ?Lab Results  ?Component Value Date  ? CHOLHDL 4 05/02/2021  ? ?Lab Results  ?Component Value Date  ? HGBA1C 6.7 (H) 05/02/2021  ? ? ?  ?Assessment & Plan:  ? ?Problem List Items Addressed This Visit   ?None ?Visit Diagnoses   ? ? Need for shingles vaccine    -  Primary  ? Relevant Orders  ? Varicella-zoster vaccine IM (Shingrix) (Completed)  ? Piriformis syndrome of right side      ? Relevant Medications  ? buPROPion (WELLBUTRIN XL) 150 MG 24 hr tablet  ? Other Relevant Orders  ? Ambulatory referral to Sports Medicine  ? Strain of left knee, initial encounter      ? Relevant Medications  ? diclofenac Sodium (VOLTAREN) 1 % GEL  ? Other Relevant Orders  ? Ambulatory referral to Sports Medicine  ? DG Knee Complete 4 Views Left  ? ?  ? ? ?Meds ordered this encounter  ?Medications  ? diclofenac Sodium (VOLTAREN) 1 % GEL  ?  Sig: Apply a small grape sized dollop to the left knee every 6 hours as needed.  ?   Dispense:  150 g  ?  Refill:  1  ? ? ?Follow-up: Return if symptoms worsen or fail to improve.  ? ? ?Libby Maw, MD ?

## 2021-11-07 ENCOUNTER — Other Ambulatory Visit: Payer: Self-pay | Admitting: Family Medicine

## 2021-11-07 DIAGNOSIS — E119 Type 2 diabetes mellitus without complications: Secondary | ICD-10-CM

## 2021-11-07 DIAGNOSIS — I10 Essential (primary) hypertension: Secondary | ICD-10-CM

## 2021-11-13 ENCOUNTER — Other Ambulatory Visit (HOSPITAL_BASED_OUTPATIENT_CLINIC_OR_DEPARTMENT_OTHER): Payer: Self-pay | Admitting: Family Medicine

## 2021-11-13 ENCOUNTER — Ambulatory Visit: Payer: Medicare Other | Admitting: Family Medicine

## 2021-11-13 ENCOUNTER — Ambulatory Visit: Payer: Self-pay

## 2021-11-13 ENCOUNTER — Encounter: Payer: Self-pay | Admitting: Family Medicine

## 2021-11-13 VITALS — BP 148/60 | Ht 64.0 in | Wt 194.0 lb

## 2021-11-13 DIAGNOSIS — M76899 Other specified enthesopathies of unspecified lower limb, excluding foot: Secondary | ICD-10-CM | POA: Diagnosis not present

## 2021-11-13 DIAGNOSIS — S82122A Displaced fracture of lateral condyle of left tibia, initial encounter for closed fracture: Secondary | ICD-10-CM

## 2021-11-13 DIAGNOSIS — Z1231 Encounter for screening mammogram for malignant neoplasm of breast: Secondary | ICD-10-CM

## 2021-11-13 DIAGNOSIS — M25562 Pain in left knee: Secondary | ICD-10-CM

## 2021-11-13 MED ORDER — METHYLPREDNISOLONE ACETATE 40 MG/ML IJ SUSP
40.0000 mg | Freq: Once | INTRAMUSCULAR | Status: AC
  Administered 2021-11-13: 40 mg via INTRA_ARTICULAR

## 2021-11-13 NOTE — Assessment & Plan Note (Signed)
Acutely occurring.  Initial injury around 3/16.  Findings on ultrasound imaging most consistent with a nondisplaced fracture of the tibial plateau. ?-Counseled on home exercise therapy and supportive care ?-Counseled on weightbearing ?-Could consider further imaging and physical therapy. ?

## 2021-11-13 NOTE — Assessment & Plan Note (Signed)
Acute on chronic in nature.  Has changes at the proximal hamstring. ?-Counseled on home exercise therapy and supportive care. ?-Injection today. ?-Could consider shockwave therapy or nitro patches. ?

## 2021-11-13 NOTE — Patient Instructions (Signed)
Good to see you ?Please try ice on the knee  ?Please try heat on the hamstring  ?Please try the exercises   ?Please send me a message in MyChart with any questions or updates.  ?Please see me back in 3 weeks.  ? ?--Dr. Raeford Razor ? ?

## 2021-11-13 NOTE — Progress Notes (Signed)
?Paula Kelly - 66 y.o. female MRN [id number]  Date of birth: 12/02/1955 ? ?SUBJECTIVE:  Including CC & ROS.  ?No chief complaint on file. ? ? ?Paula Kelly is a 65 y.o. female that is presenting with acute left knee pain and acute on chronic right gluteal pain.  She was at a dog park recently and her dogs ran into her left knee.  She had exquisite pain at that time.  The pain is lateral in nature.  It has been doing better.  She has had right gluteal pain that is worsened with driving.  She initially felt the pain in December 2022.  No improvement with modalities today.. ? ?Review of the note from 3/20 shows she was provided an x-ray and Voltaren gel. ?Independent review of the left knee x-ray from 3/20 shows mild to moderate medial joint space narrowing. ?Review of the lumbar spine x-ray from 2004 reports a bilateral degenerative change at the facet joint injections 1. ? ?Review of Systems ?See HPI  ? ?HISTORY: Past Medical, Surgical, Social, and Family History Reviewed & Updated per EMR.   ?Pertinent Historical Findings include: ? ?Past Medical History:  ?Diagnosis Date  ? Allergy   ? seasonal  ? Arthritis   ? Cataract   ? Depression   ? Diabetes mellitus without complication ([place])   ? H/O fibromyalgia   ? had for over 10 years  ? Hyperlipidemia   ? Hypertension   ? Post-operative nausea and vomiting   ? ? ?Past Surgical History:  ?Procedure Laterality Date  ? APPENDECTOMY    ? COLONOSCOPY    ? kidney stone removal  2013  ? used a basket extraction  ? ? ? ?PHYSICAL EXAM:  ?VS: BP (!) 148/60 (BP Location: Left Arm, Patient Position: Sitting)   Ht '5\' 4"'$  (1.626 m)   Wt 194 lb (88 kg)   BMI 33.30 kg/m?  ?Physical Exam ?Gen: NAD, alert, cooperative with exam, well-appearing ?MSK:  ?Neurovascularly intact   ? ?Limited ultrasound: Left knee, right gluteus: ? ?Left knee: ?Mild effusion suprapatellar pouch. ?Normal-appearing quadricep and patellar tendon. ?Mild degenerative changes of the medial joint  space. ?Increased hyperemia over the lateral tibial plateau to indicate nondisplaced fracture. ?Mild degenerative changes of the lateral compartment ? ?Right gluteus: ?Increased hyperemia and irregularity at the ischial tuberosity to indicate tendinopathy ? ?Summary: Left knee with lateral tibial plateau fracture and right proximal hamstring tendinopathy ? ?Ultrasound and interpretation by Clearance Coots, MD ? ? ?Aspiration/Injection Procedure Note ?Paula Kelly ?1956/07/06 ? ?Procedure: Injection ?Indications: Right hamstring pain ? ?Procedure Details ?Consent: Risks of procedure as well as the alternatives and risks of each were explained to the (patient/caregiver).  Consent for procedure obtained. ?Time Out: Verified patient identification, verified procedure, site/side was marked, verified correct patient position, special equipment/implants available, medications/allergies/relevent history reviewed, required imaging and test results available.  Performed.  The area was cleaned with iodine and alcohol swabs.   ? ?The right proximal hamstring at the ischial tuberosity was injected using 1 cc's of 40 mg Depo-Medrol and 4 cc's of 0.25% bupivacaine with a 22 1 1/2" needle.  Ultrasound was used. Images were obtained in short views showing the injection.   ? ? ?A sterile dressing was applied. ? ?Patient did tolerate procedure well. ? ? ? ?ASSESSMENT & PLAN:  ? ?Closed fracture of lateral portion of left tibial plateau ?Acutely occurring.  Initial injury around 3/16.  Findings on ultrasound imaging most consistent with a nondisplaced fracture of  the tibial plateau. ?-Counseled on home exercise therapy and supportive care ?-Counseled on weightbearing ?-Could consider further imaging and physical therapy. ? ?Hamstring tendinitis at origin ?Acute on chronic in nature.  Has changes at the proximal hamstring. ?-Counseled on home exercise therapy and supportive care. ?-Injection today. ?-Could consider shockwave therapy  or nitro patches. ? ? ?. ? ?

## 2021-11-14 ENCOUNTER — Ambulatory Visit (HOSPITAL_BASED_OUTPATIENT_CLINIC_OR_DEPARTMENT_OTHER)
Admission: RE | Admit: 2021-11-14 | Discharge: 2021-11-14 | Disposition: A | Discharge status: Home / Self Care | Payer: Medicare Other | Source: Ambulatory Visit | Attending: Family Medicine | Admitting: Family Medicine

## 2021-11-14 ENCOUNTER — Encounter (HOSPITAL_BASED_OUTPATIENT_CLINIC_OR_DEPARTMENT_OTHER): Payer: Self-pay

## 2021-11-14 ENCOUNTER — Other Ambulatory Visit: Payer: Self-pay

## 2021-11-14 DIAGNOSIS — Z1231 Encounter for screening mammogram for malignant neoplasm of breast: Secondary | ICD-10-CM | POA: Diagnosis not present

## 2021-11-28 ENCOUNTER — Ambulatory Visit: Payer: Federal, State, Local not specified - PPO | Admitting: Family Medicine

## 2021-11-29 ENCOUNTER — Encounter: Payer: Self-pay | Admitting: Family Medicine

## 2021-11-29 ENCOUNTER — Ambulatory Visit (INDEPENDENT_AMBULATORY_CARE_PROVIDER_SITE_OTHER): Payer: Medicare Other | Admitting: Family Medicine

## 2021-11-29 VITALS — BP 162/70 | HR 100 | Temp 97.6°F | Ht 64.0 in | Wt 193.8 lb

## 2021-11-29 DIAGNOSIS — E7800 Pure hypercholesterolemia, unspecified: Secondary | ICD-10-CM

## 2021-11-29 DIAGNOSIS — R0989 Other specified symptoms and signs involving the circulatory and respiratory systems: Secondary | ICD-10-CM | POA: Diagnosis not present

## 2021-11-29 DIAGNOSIS — E119 Type 2 diabetes mellitus without complications: Secondary | ICD-10-CM

## 2021-11-29 DIAGNOSIS — I10 Essential (primary) hypertension: Secondary | ICD-10-CM | POA: Diagnosis not present

## 2021-11-29 DIAGNOSIS — Z720 Tobacco use: Secondary | ICD-10-CM

## 2021-11-29 LAB — COMPREHENSIVE METABOLIC PANEL
ALT: 11 U/L (ref 0–35)
AST: 12 U/L (ref 0–37)
Albumin: 4.7 g/dL (ref 3.5–5.2)
Alkaline Phosphatase: 90 U/L (ref 39–117)
BUN: 14 mg/dL (ref 6–23)
CO2: 30 mEq/L (ref 19–32)
Calcium: 10 mg/dL (ref 8.4–10.5)
Chloride: 104 mEq/L (ref 96–112)
Creatinine, Ser: 0.76 mg/dL (ref 0.40–1.20)
GFR: 82.42 mL/min (ref >60.00)
Glucose, Bld: 71 mg/dL (ref 70–99)
Potassium: 4.8 mEq/L (ref 3.5–5.1)
Sodium: 142 mEq/L (ref 135–145)
Total Bilirubin: 0.4 mg/dL (ref 0.2–1.2)
Total Protein: 6.8 g/dL (ref 6.0–8.3)

## 2021-11-29 LAB — LIPID PANEL
Cholesterol: 154 mg/dL (ref 0–200)
HDL: 51.6 mg/dL (ref >39.00)
LDL Cholesterol: 70 mg/dL (ref 0–99)
NonHDL: 102.65
Total CHOL/HDL Ratio: 3
Triglycerides: 161 mg/dL — ABNORMAL HIGH (ref 0.0–149.0)
VLDL: 32.2 mg/dL (ref 0.0–40.0)

## 2021-11-29 LAB — CBC WITH DIFFERENTIAL/PLATELET
Basophils Absolute: 0.1 10*3/uL (ref 0.0–0.1)
Basophils Relative: 0.7 % (ref 0.0–3.0)
Eosinophils Absolute: 0.2 10*3/uL (ref 0.0–0.7)
Eosinophils Relative: 2.6 % (ref 0.0–5.0)
HCT: 41.7 % (ref 36.0–46.0)
Hemoglobin: 14 g/dL (ref 12.0–15.0)
Lymphocytes Relative: 26.3 % (ref 12.0–46.0)
Lymphs Abs: 2.1 10*3/uL (ref 0.7–4.0)
MCHC: 33.5 g/dL (ref 30.0–36.0)
MCV: 90 fl (ref 78.0–100.0)
Monocytes Absolute: 0.8 10*3/uL (ref 0.1–1.0)
Monocytes Relative: 9.7 % (ref 3.0–12.0)
Neutro Abs: 4.9 10*3/uL (ref 1.4–7.7)
Neutrophils Relative %: 60.7 % (ref 43.0–77.0)
Platelets: 359 10*3/uL (ref 150.0–400.0)
RBC: 4.64 Mil/uL (ref 3.87–5.11)
RDW: 13.6 % (ref 11.5–15.5)
WBC: 8.1 10*3/uL (ref 4.0–10.5)

## 2021-11-29 LAB — LDL CHOLESTEROL, DIRECT: Direct LDL: 86 mg/dL

## 2021-11-29 LAB — URINALYSIS
Bilirubin Urine: NEGATIVE
Hgb urine dipstick: NEGATIVE
Ketones, ur: NEGATIVE
Leukocytes,Ua: NEGATIVE
Nitrite: NEGATIVE
Specific Gravity, Urine: 1.01 (ref 1.000–1.030)
Total Protein, Urine: NEGATIVE
Urine Glucose: NEGATIVE
Urobilinogen, UA: 0.2 (ref 0.0–1.0)
pH: 7 (ref 5.0–8.0)

## 2021-11-29 LAB — HEMOGLOBIN A1C: Hgb A1c MFr Bld: 6.9 % — ABNORMAL HIGH (ref 4.6–6.5)

## 2021-11-29 NOTE — Progress Notes (Signed)
? ?Established Patient Office Visit ? ?Subjective:  ?Patient ID: Paula Kelly, female    DOB: [birth year]  Age: 66 y.o. MRN: [id number] ? ?CC:  ?Chief Complaint  ?Patient presents with  ? Follow-up  ?  3 month follow up, no concerns. Patient fasting.   ? ? ?HPI ?Paula Kelly presents for follow-up of hypertension, diabetes and elevated cholesterol.  Exercise and driving have been limited by hamstring tendinitis and tibial plateau fracture.  Occasional pain in her calfs when walking long distance is. ? ?Past Medical History:  ?Diagnosis Date  ? Allergy   ? seasonal  ? Arthritis   ? Cataract   ? Depression   ? Diabetes mellitus without complication ([place])   ? H/O fibromyalgia   ? had for over 10 years  ? Hyperlipidemia   ? Hypertension   ? Post-operative nausea and vomiting   ? ? ?Past Surgical History:  ?Procedure Laterality Date  ? APPENDECTOMY    ? COLONOSCOPY    ? kidney stone removal  2013  ? used a basket extraction  ? ? ?Family History  ?Problem Relation Age of Onset  ? Diabetes Mother   ? Heart disease Father   ? Emphysema Father   ? CVA Sister   ? AAA (abdominal aortic aneurysm) Brother   ? ? ?Social History  ? ?Socioeconomic History  ? Marital status: Married  ?  Spouse name: Not on file  ? Number of children: Not on file  ? Years of education: Not on file  ? Highest education level: Not on file  ?Occupational History  ? Not on file  ?Tobacco Use  ? Smoking status: Every Day  ?  Packs/day: 1.00  ?  Years: 47.00  ?  Pack years: 47.00  ?  Types: Cigarettes  ? Smokeless tobacco: Never  ?Substance and Sexual Activity  ? Alcohol use: No  ? Drug use: No  ? Sexual activity: Not on file  ?Other Topics Concern  ? Not on file  ?Social History Narrative  ? Not on file  ? ?Social Determinants of Health  ? ?Financial Resource Strain: Not on file  ?Food Insecurity: Not on file  ?Transportation Needs: Not on file  ?Physical Activity: Not on file  ?Stress: Not on file  ?Social Connections: Not on file  ?Intimate  Partner Violence: Not on file  ? ? ?Outpatient Medications Prior to Visit  ?Medication Sig Dispense Refill  ? amphetamine-dextroamphetamine (ADDERALL XR) 30 MG 24 hr capsule Will start this prescription once the 20 mg prescription has run out.    ? Ascorbic Acid (VITAMIN C) 100 MG tablet Take 100 mg by mouth daily.    ? aspirin EC 81 MG tablet Take 1 tablet (81 mg total) by mouth daily. 365 tablet 0  ? atorvastatin (LIPITOR) 80 MG tablet Take 1 tablet (80 mg total) by mouth daily. 90 tablet 3  ? buPROPion (WELLBUTRIN XL) 150 MG 24 hr tablet Take 1 tablet by mouth daily.    ? candesartan (ATACAND) 32 MG tablet TAKE 1 TABLET(32 MG) BY MOUTH DAILY 100 tablet 1  ? Cholecalciferol (VITAMIN D3) 50 MCG (2000 UT) TABS Take 1 tablet by mouth daily. 90 tablet 2  ? Cyanocobalamin (VITAMIN B 12 PO) Take by mouth.    ? diclofenac Sodium (VOLTAREN) 1 % GEL Apply a small grape sized dollop to the left knee every 6 hours as needed. 150 g 1  ? DULoxetine (CYMBALTA) 60 MG capsule Take 2 capsules (120 mg  total) by mouth daily. 180 capsule 1  ? folic acid (FOLVITE) 1 MG tablet Take 1 mg by mouth daily.    ? glucose blood (FREESTYLE LITE) test strip Use to test 1-2 times daily. 100 each 5  ? hydrochlorothiazide (HYDRODIURIL) 25 MG tablet TAKE 1 TABLET(25 MG) BY MOUTH DAILY 90 tablet 1  ? metFORMIN (GLUCOPHAGE) 500 MG tablet TAKE 1 TABLET(500 MG) BY MOUTH TWICE DAILY 180 tablet 2  ? mometasone (NASONEX) 50 MCG/ACT nasal spray Place 2 sprays into the nose daily. 1 each 12  ? Multiple Vitamin (MULTI-VITAMINS) TABS Take 1 tablet by mouth daily.    ? QUEtiapine (SEROQUEL) 25 MG tablet Take 25 mg by mouth at bedtime.    ? vitamin E 400 UNIT capsule Take 1 capsule by mouth daily.    ? ?No facility-administered medications prior to visit.  ? ? ?Allergies  ?Allergen Reactions  ? Metronidazole Rash  ? ? ?ROS ?Review of Systems  ?Constitutional: Negative.   ?HENT: Negative.    ?Eyes:  Negative for photophobia and visual disturbance.  ?Respiratory:  Negative.    ?Cardiovascular: Negative.   ?Gastrointestinal: Negative.   ?Genitourinary: Negative.   ?Musculoskeletal:  Positive for gait problem.  ?Neurological:  Negative for speech difficulty, weakness and light-headedness.  ? ?  ?Objective:  ?  ?Physical Exam ?Vitals and nursing note reviewed.  ?Constitutional:   ?   General: She is not in acute distress. ?   Appearance: Normal appearance. She is not ill-appearing, toxic-appearing or diaphoretic.  ?HENT:  ?   Head: Normocephalic and atraumatic.  ?   Right Ear: External ear normal.  ?   Left Ear: External ear normal.  ?   Mouth/Throat:  ?   Mouth: Mucous membranes are moist.  ?   Pharynx: Oropharynx is clear. No oropharyngeal exudate or posterior oropharyngeal erythema.  ?Eyes:  ?   Extraocular Movements: Extraocular movements intact.  ?   Conjunctiva/sclera: Conjunctivae normal.  ?   Pupils: Pupils are equal, round, and reactive to light.  ?Cardiovascular:  ?   Rate and Rhythm: Normal rate and regular rhythm.  ?   Pulses:     ?     Popliteal pulses are 1+ on the right side and 1+ on the left side.  ?     Dorsalis pedis pulses are 0 on the right side and 0 on the left side.  ?   Comments: Capillary refill less than 3 seconds both great toes. ?Pulmonary:  ?   Effort: Pulmonary effort is normal.  ?   Breath sounds: Normal breath sounds. Decreased air movement present.  ?Abdominal:  ?   General: Bowel sounds are normal.  ?Musculoskeletal:  ?   Cervical back: No rigidity or tenderness.  ?   Right lower leg: No edema.  ?   Left lower leg: No edema.  ?Lymphadenopathy:  ?   Cervical: No cervical adenopathy.  ?Skin: ?   General: Skin is warm and dry.  ?Neurological:  ?   Mental Status: She is alert and oriented to person, place, and time.  ?Psychiatric:     ?   Mood and Affect: Mood normal.     ?   Behavior: Behavior normal.  ? ?Diabetic Foot Exam - Simple   ?Simple Foot Form ?Diabetic Foot exam was performed with the following findings: Yes 11/29/2021  9:45 AM  ?Visual  Inspection ?See comments: Yes ?Sensation Testing ?Intact to touch and monofilament testing bilaterally: Yes ?Pulse Check ?See comments: Yes ?Comments ?  Left foot dorsalis pedis pulse 1+ posterior tibial 0, right foot dorsalis pedis pulse 0 posterior tibial ?.  Capillary refill less than 3 seconds both great toes. ?  ?Followed by G ?BP (!) 162/70 (BP Location: Left Arm, Patient Position: Sitting, Cuff Size: Large)   Pulse 100   Temp 97.6 ?F (36.4 ?C) (Temporal)   Ht '5\' 4"'$  (1.626 m)   Wt 193 lb 12.8 oz (87.9 kg)   SpO2 98%   BMI 33.27 kg/m?  ?Wt Readings from Last 3 Encounters:  ?11/29/21 193 lb 12.8 oz (87.9 kg)  ?11/13/21 194 lb (88 kg)  ?11/06/21 194 lb 9.6 oz (88.3 kg)  ? ? ? ?Health Maintenance Due  ?Topic Date Due  ? HEMOGLOBIN A1C  10/30/2021  ? ? ?There are no preventive care reminders to display for this patient. ? ?Lab Results  ?Component Value Date  ? TSH 2.13 08/30/2021  ? ?Lab Results  ?Component Value Date  ? WBC 11.1 (H) 08/30/2021  ? HGB 13.1 08/30/2021  ? HCT 40.5 08/30/2021  ? MCV 89.8 08/30/2021  ? PLT 359.0 08/30/2021  ? ?Lab Results  ?Component Value Date  ? NA 139 08/30/2021  ? K 4.3 08/30/2021  ? CO2 28 08/30/2021  ? GLUCOSE 109 (H) 08/30/2021  ? BUN 14 08/30/2021  ? CREATININE 0.76 08/30/2021  ? BILITOT 0.5 05/02/2021  ? ALKPHOS 82 05/02/2021  ? AST 17 05/02/2021  ? ALT 20 05/02/2021  ? PROT 7.5 05/02/2021  ? ALBUMIN 4.9 05/02/2021  ? CALCIUM 9.9 08/30/2021  ? GFR 82.56 08/30/2021  ? ?Lab Results  ?Component Value Date  ? CHOL 165 05/02/2021  ? ?Lab Results  ?Component Value Date  ? HDL 44.90 05/02/2021  ? ?Lab Results  ?Component Value Date  ? [place] 98 10/24/2018  ? ?Lab Results  ?Component Value Date  ? TRIG 244.0 (H) 05/02/2021  ? ?Lab Results  ?Component Value Date  ? CHOLHDL 4 05/02/2021  ? ?Lab Results  ?Component Value Date  ? HGBA1C 6.7 (H) 05/02/2021  ? ? ?  ?Assessment & Plan:  ? ?Problem List Items Addressed This Visit   ? ?  ? Cardiovascular and Mediastinum  ? Essential  hypertension - Primary  ? Relevant Orders  ? CBC with Differential/Platelet  ? Urinalysis  ? Comprehensive metabolic panel  ?  ? Endocrine  ? Controlled type 2 diabetes mellitus without complication, without long

## 2021-11-30 MED ORDER — EZETIMIBE 10 MG PO TABS: 10.0000 mg | ORAL_TABLET | Freq: Every day | ORAL | 3 refills | Status: DC

## 2021-11-30 NOTE — Addendum Note (Signed)
Addended by: Jon Billings on: 11/30/2021 08:29 AM ? ? Modules accepted: Orders ? ?

## 2021-12-06 ENCOUNTER — Encounter: Payer: Self-pay | Admitting: Family Medicine

## 2021-12-06 ENCOUNTER — Ambulatory Visit (INDEPENDENT_AMBULATORY_CARE_PROVIDER_SITE_OTHER): Payer: Medicare Other | Admitting: Family Medicine

## 2021-12-06 VITALS — BP 140/60 | Ht 64.0 in | Wt 193.0 lb

## 2021-12-06 DIAGNOSIS — M76899 Other specified enthesopathies of unspecified lower limb, excluding foot: Secondary | ICD-10-CM | POA: Diagnosis not present

## 2021-12-06 MED ORDER — NITROGLYCERIN 0.2 MG/HR TD PT24
MEDICATED_PATCH | TRANSDERMAL | 11 refills | Status: DC
Start: 2021-12-06 — End: 2022-10-31

## 2021-12-06 NOTE — Assessment & Plan Note (Signed)
Acutely occurring.  Still having pain with sitting and certain motions.  Did get a few days of relief with the steroid injection. ?-Counseled on home exercise therapy and supportive care. ?-Nitro patches. ?-Initiate shockwave therapy. ?-Could consider prolotherapy. ?

## 2021-12-06 NOTE — Patient Instructions (Signed)
Good to see you ?Please try heat  ?Please try the nitro patches   ?Please send me a message in MyChart with any questions or updates.  ?Please see me back to continue shockwave therapy.  ? ?--Dr. Raeford Razor ? ?Nitroglycerin Protocol ? ?Apply 1/4 nitroglycerin patch to affected area daily. ?Change position of patch within the affected area every 24 hours. ?You may experience a headache during the first 1-2 weeks of using the patch, these should subside. ?If you experience headaches after beginning nitroglycerin patch treatment, you may take your preferred over the counter pain reliever. ?Another side effect of the nitroglycerin patch is skin irritation or rash related to patch adhesive. ?Please notify our office if you develop more severe headaches or rash, and stop the patch. ?Tendon healing with nitroglycerin patch may require 12 to 24 weeks depending on the extent of injury. ?Men should not use if taking Viagra, Cialis, or Levitra.  ?Do not use if you have migraines or rosacea.  ? ?

## 2021-12-06 NOTE — Progress Notes (Signed)
?  Paula Kelly - 66 y.o. female MRN [id number]  Date of birth: 12/14/1955 ? ?SUBJECTIVE:  Including CC & ROS.  ?No chief complaint on file. ? ? ?Paula Kelly is a 66 y.o. female that is following up for her left hamstring pain.  She got about 3 days of relief with the steroid injection.  She still having pain with sitting and driving.  The initial injury was in December. ? ? ? ?Review of Systems ?See HPI  ? ?HISTORY: Past Medical, Surgical, Social, and Family History Reviewed & Updated per EMR.   ?Pertinent Historical Findings include: ? ?Past Medical History:  ?Diagnosis Date  ? Allergy   ? seasonal  ? Arthritis   ? Cataract   ? Depression   ? Diabetes mellitus without complication ([place])   ? H/O fibromyalgia   ? had for over 10 years  ? Hyperlipidemia   ? Hypertension   ? Post-operative nausea and vomiting   ? ? ?Past Surgical History:  ?Procedure Laterality Date  ? APPENDECTOMY    ? COLONOSCOPY    ? kidney stone removal  2013  ? used a basket extraction  ? ? ? ?PHYSICAL EXAM:  ?VS: BP 140/60 (BP Location: Left Arm, Patient Position: Sitting)   Ht '5\' 4"'$  (1.626 m)   Wt 193 lb (87.5 kg)   BMI 33.13 kg/m?  ?Physical Exam ?Gen: NAD, alert, cooperative with exam, well-appearing ?MSK:  ?Neurovascularly intact   ? ?ECSWT Note ?Paula Kelly ?1955-12-18 ? ?Procedure: ECSWT ?Indications: Right hamstring pain ? ?Procedure Details ?Consent: Risks of procedure as well as the alternatives and risks of each were explained to the (patient/caregiver).  Consent for procedure obtained. ?Time Out: Verified patient identification, verified procedure, site/side was marked, verified correct patient position, special equipment/implants available, medications/allergies/relevent history reviewed, required imaging and test results available.  Performed.  The area was cleaned with iodine and alcohol swabs.   ? ?The right hamstring origin  was targeted for Extracorporeal shockwave therapy.  ? ?Preset: patellar tendinopathy   ?Power Level: 90 ?Frequency: 10 ?Impulse/cycles: 2200 ?Head size: large  ?Session: 1st ? ?Patient did tolerate procedure well. ? ? ? ?ASSESSMENT & PLAN:  ? ?Hamstring tendinitis at origin ?Acutely occurring.  Still having pain with sitting and certain motions.  Did get a few days of relief with the steroid injection. ?-Counseled on home exercise therapy and supportive care. ?-Nitro patches. ?-Initiate shockwave therapy. ?-Could consider prolotherapy. ? ? ? ? ?

## 2021-12-12 ENCOUNTER — Encounter: Payer: Self-pay | Admitting: Family Medicine

## 2021-12-12 ENCOUNTER — Other Ambulatory Visit (HOSPITAL_BASED_OUTPATIENT_CLINIC_OR_DEPARTMENT_OTHER): Payer: Self-pay

## 2021-12-12 ENCOUNTER — Ambulatory Visit (INDEPENDENT_AMBULATORY_CARE_PROVIDER_SITE_OTHER): Payer: Self-pay | Admitting: Family Medicine

## 2021-12-12 DIAGNOSIS — M76899 Other specified enthesopathies of unspecified lower limb, excluding foot: Secondary | ICD-10-CM

## 2021-12-12 NOTE — Progress Notes (Signed)
?  Paula Kelly - 66 y.o. female MRN [id number]  Date of birth: 1956-07-05 ? ?SUBJECTIVE:  Including CC & ROS.  ?No chief complaint on file. ? ? ?Paula Kelly is a 66 y.o. female that is here for shockwave therapy. ? ? ? ?Review of Systems ?See HPI  ? ?HISTORY: Past Medical, Surgical, Social, and Family History Reviewed & Updated per EMR.   ?Pertinent Historical Findings include: ? ?Past Medical History:  ?Diagnosis Date  ? Allergy   ? seasonal  ? Arthritis   ? Cataract   ? Depression   ? Diabetes mellitus without complication ([place])   ? H/O fibromyalgia   ? had for over 10 years  ? Hyperlipidemia   ? Hypertension   ? Post-operative nausea and vomiting   ? ? ?Past Surgical History:  ?Procedure Laterality Date  ? APPENDECTOMY    ? COLONOSCOPY    ? kidney stone removal  2013  ? used a basket extraction  ? ? ? ?PHYSICAL EXAM:  ?VS: Ht '5\' 4"'$  (1.626 m)   Wt 193 lb (87.5 kg)   BMI 33.13 kg/m?  ?Physical Exam ?Gen: NAD, alert, cooperative with exam, well-appearing ?MSK:  ?Neurovascularly intact   ? ?ECSWT Note ?Paula Kelly ?[birth year] ? ?Procedure: ECSWT ?Indications: left hamstring pain ? ?Procedure Details ?Consent: Risks of procedure as well as the alternatives and risks of each were explained to the (patient/caregiver).  Consent for procedure obtained. ?Time Out: Verified patient identification, verified procedure, site/side was marked, verified correct patient position, special equipment/implants available, medications/allergies/relevent history reviewed, required imaging and test results available.  Performed.  The area was cleaned with iodine and alcohol swabs.   ? ?The left hamstring origin was targeted for Extracorporeal shockwave therapy.  ? ?Preset: Patellar tendinopathy ?Power Level: 90 ?Frequency: 10 ?Impulse/cycles: 2200 ?Head size: Large ?Session: 2nd ? ?Patient did tolerate procedure well. ? ? ? ?ASSESSMENT & PLAN:  ? ?Hamstring tendinitis at origin ?Completed shockwave therapy.  ? ? ? ? ?

## 2021-12-12 NOTE — Assessment & Plan Note (Signed)
Completed shockwave therapy  

## 2021-12-15 ENCOUNTER — Ambulatory Visit (INDEPENDENT_AMBULATORY_CARE_PROVIDER_SITE_OTHER): Payer: Self-pay | Admitting: Family Medicine

## 2021-12-15 DIAGNOSIS — M76899 Other specified enthesopathies of unspecified lower limb, excluding foot: Secondary | ICD-10-CM

## 2021-12-15 NOTE — Assessment & Plan Note (Signed)
Completed shockwave therapy  ?Referral for TenJet ?

## 2021-12-15 NOTE — Progress Notes (Signed)
?  Paula Kelly - 66 y.o. female MRN [id number]  Date of birth: Feb 15, 1956 ? ?SUBJECTIVE:  Including CC & ROS.  ?No chief complaint on file. ? ? ?Paula Kelly is a 66 y.o. female that is here for shockwave therapy. ? ? ?Review of Systems ?See HPI  ? ?HISTORY: Past Medical, Surgical, Social, and Family History Reviewed & Updated per EMR.   ?Pertinent Historical Findings include: ? ?Past Medical History:  ?Diagnosis Date  ? Allergy   ? seasonal  ? Arthritis   ? Cataract   ? Depression   ? Diabetes mellitus without complication ([place])   ? H/O fibromyalgia   ? had for over 10 years  ? Hyperlipidemia   ? Hypertension   ? Post-operative nausea and vomiting   ? ? ?Past Surgical History:  ?Procedure Laterality Date  ? APPENDECTOMY    ? COLONOSCOPY    ? kidney stone removal  2013  ? used a basket extraction  ? ? ? ?PHYSICAL EXAM:  ?VS: Ht '5\' 4"'$  (1.626 m)   Wt 193 lb (87.5 kg)   BMI 33.13 kg/m?  ?Physical Exam ?Gen: NAD, alert, cooperative with exam, well-appearing ?MSK:  ?Neurovascularly intact   ? ?ECSWT Note ?Paula Kelly ?18-Mar-1956 ? ?Procedure: ECSWT ?Indications: right hamstring pain ? ?Procedure Details ?Consent: Risks of procedure as well as the alternatives and risks of each were explained to the (patient/caregiver).  Consent for procedure obtained. ?Time Out: Verified patient identification, verified procedure, site/side was marked, verified correct patient position, special equipment/implants available, medications/allergies/relevent history reviewed, required imaging and test results available.  Performed.  The area was cleaned with iodine and alcohol swabs.   ? ?The right hamstring was targeted for Extracorporeal shockwave therapy.  ? ?Preset: patellar tendinopathy  ?Power Level: 100 ?Frequency: 10 ?Impulse/cycles: 2300 ?Head size: large  ?Session: 3 ? ?Patient did tolerate procedure well. ? ? ? ?ASSESSMENT & PLAN:  ? ?Hamstring tendinitis at origin ?Completed shockwave therapy  ?Referral for  TenJet ? ? ? ? ?

## 2021-12-22 ENCOUNTER — Ambulatory Visit: Payer: Medicare Other | Admitting: Family Medicine

## 2022-01-08 DIAGNOSIS — F909 Attention-deficit hyperactivity disorder, unspecified type: Secondary | ICD-10-CM | POA: Diagnosis not present

## 2022-01-08 DIAGNOSIS — Z79899 Other long term (current) drug therapy: Secondary | ICD-10-CM | POA: Diagnosis not present

## 2022-01-08 DIAGNOSIS — F329 Major depressive disorder, single episode, unspecified: Secondary | ICD-10-CM | POA: Diagnosis not present

## 2022-01-10 DIAGNOSIS — M76899 Other specified enthesopathies of unspecified lower limb, excluding foot: Secondary | ICD-10-CM | POA: Diagnosis not present

## 2022-02-13 ENCOUNTER — Encounter: Payer: Self-pay | Admitting: Family Medicine

## 2022-03-01 ENCOUNTER — Telehealth: Payer: Self-pay | Admitting: Family Medicine

## 2022-03-01 NOTE — Telephone Encounter (Signed)
Caller Name: [name] Call back phone #: [phone]  Reason for Call: Pt called to see if she needs to schedule 2nd shingrix. She said she had 1st one in the office. Pt has Medicare primary ins.

## 2022-03-13 ENCOUNTER — Other Ambulatory Visit: Payer: Self-pay | Admitting: Family Medicine

## 2022-03-13 DIAGNOSIS — E119 Type 2 diabetes mellitus without complications: Secondary | ICD-10-CM

## 2022-03-14 NOTE — Telephone Encounter (Signed)
Spoke with patient informed patient that medicare does not cover Shingles vaccines at offices in most cases BUT with patient having BCBS I was able to call them to see if they would cover patients 1st shingles vaccine that was given. Per [employer]BS they will cover leaving patient with no cost out of pocket for having vaccine in office. Also explained to patient that with patient having medicare as primary it would be best to have 2nd shingles vaccine at her local pharmacy. Patient verbalized understanding and states that she is fine with having 2nd dose at pharmacy, will give them a call to schedule and give [place] the date to put in patients chart when she receives 2nd vaccine at pharmacy.

## 2022-04-19 ENCOUNTER — Other Ambulatory Visit: Payer: Self-pay | Admitting: Family Medicine

## 2022-04-19 DIAGNOSIS — I10 Essential (primary) hypertension: Secondary | ICD-10-CM

## 2022-06-20 ENCOUNTER — Other Ambulatory Visit: Payer: Self-pay | Admitting: Family Medicine

## 2022-06-20 DIAGNOSIS — I10 Essential (primary) hypertension: Secondary | ICD-10-CM

## 2022-06-20 DIAGNOSIS — E7800 Pure hypercholesterolemia, unspecified: Secondary | ICD-10-CM

## 2022-06-20 DIAGNOSIS — E119 Type 2 diabetes mellitus without complications: Secondary | ICD-10-CM

## 2022-10-26 ENCOUNTER — Ambulatory Visit (INDEPENDENT_AMBULATORY_CARE_PROVIDER_SITE_OTHER): Payer: Medicare Other

## 2022-10-26 VITALS — Ht 63.5 in | Wt 200.0 lb

## 2022-10-26 DIAGNOSIS — Z0000 Encounter for general adult medical examination without abnormal findings: Secondary | ICD-10-CM

## 2022-10-26 NOTE — Progress Notes (Signed)
I connected with  Paula Kelly on 10/26/22 by a audio enabled telemedicine application and verified that I am speaking with the correct person using two identifiers.  Patient Location: Home  Provider Location: Office/Clinic  I discussed the limitations of evaluation and management by telemedicine. The patient expressed understanding and agreed to proceed.  Subjective:   Paula Kelly is a 67 y.o. female who presents for an Initial Medicare Annual Wellness Visit.  Review of Systems     Cardiac Risk Factors include: advanced age (>20mn, >>43women);diabetes mellitus;hypertension;obesity (BMI >30kg/m2);smoking/ tobacco exposure     Objective:    Today's Vitals   10/26/22 1256 10/26/22 1258  Weight: 200 lb (90.7 kg)   Height: 5' 3.5" (1.613 m)   PainSc:  2    Body mass index is 34.87 kg/m.     10/26/2022    1:04 PM 11/19/2019    1:14 PM  Advanced Directives  Does Patient Have a Medical Advance Directive? Yes Yes  Type of [place]  Does patient want to make changes to medical advance directive?  No - Patient declined  Copy of [place] Chart? No - copy requested No - copy requested    Current Medications (verified) Outpatient Encounter Medications as of 10/26/2022  Medication Sig   amphetamine-dextroamphetamine (ADDERALL XR) 30 MG 24 hr capsule Will start this prescription once the 20 mg prescription has run out.   Ascorbic Acid (VITAMIN C) 100 MG tablet Take 100 mg by mouth daily.   aspirin EC 81 MG tablet Take 1 tablet (81 mg total) by mouth daily.   atorvastatin (LIPITOR) 80 MG tablet TAKE 1 TABLET(80 MG) BY MOUTH DAILY   buPROPion (WELLBUTRIN XL) 150 MG 24 hr tablet Take 1 tablet by mouth daily.   candesartan (ATACAND) 32 MG tablet TAKE 1 TABLET(32 MG) BY MOUTH DAILY   Cholecalciferol (VITAMIN D3) 50 MCG (2000 UT) TABS Take 1 tablet by mouth daily.    diclofenac Sodium (VOLTAREN) 1 % GEL Apply a small grape sized dollop to the left knee every 6 hours as needed.   DULoxetine (CYMBALTA) 60 MG capsule Take 2 capsules (120 mg total) by mouth daily.   ezetimibe (ZETIA) 10 MG tablet Take 1 tablet (10 mg total) by mouth daily.   folic acid (FOLVITE) 1 MG tablet Take 1 mg by mouth daily.   glucose blood (FREESTYLE LITE) test strip Use to test 1-2 times daily.   metFORMIN (GLUCOPHAGE) 500 MG tablet TAKE 1 TABLET(500 MG) BY MOUTH TWICE DAILY   mometasone (NASONEX) 50 MCG/ACT nasal spray Place 2 sprays into the nose daily.   Multiple Vitamin (MULTI-VITAMINS) TABS Take 1 tablet by mouth daily.   QUEtiapine (SEROQUEL) 25 MG tablet Take 25 mg by mouth at bedtime.   vitamin E 400 UNIT capsule Take 1 capsule by mouth daily.   Cyanocobalamin (VITAMIN B 12 PO) Take by mouth. (Patient not taking: Reported on 10/26/2022)   hydrochlorothiazide (HYDRODIURIL) 25 MG tablet TAKE 1 TABLET(25 MG) BY MOUTH DAILY (Patient not taking: Reported on 10/26/2022)   nitroGLYCERIN (NITRODUR - DOSED IN MG/24 HR) 0.2 mg/hr patch Cut and apply 1/4 patch to most painful area q24h. (Patient not taking: Reported on 10/26/2022)   No facility-administered encounter medications on file as of 10/26/2022.    Allergies (verified) Metronidazole   History: Past Medical History:  Diagnosis Date   Allergy    seasonal   Arthritis  Cataract    Depression    Diabetes mellitus without complication ([facility])    H/O fibromyalgia    had for over 10 years   Hyperlipidemia    Hypertension    Post-operative nausea and vomiting    Past Surgical History:  Procedure Laterality Date   APPENDECTOMY     COLONOSCOPY     kidney stone removal  2013   used a basket extraction   Family History  Problem Relation Age of Onset   Diabetes Mother    Heart disease Father    Emphysema Father    CVA Sister    AAA (abdominal aortic aneurysm) Brother    Social History   Socioeconomic History   Marital  status: Married    Spouse name: Not on file   Number of children: Not on file   Years of education: Not on file   Highest education level: Not on file  Occupational History   Not on file  Tobacco Use   Smoking status: Every Day    Packs/day: 1.00    Years: 47.00    Total pack years: 47.00    Types: Cigarettes   Smokeless tobacco: Never  Vaping Use   Vaping Use: Never used  Substance and Sexual Activity   Alcohol use: No   Drug use: No   Sexual activity: Not on file  Other Topics Concern   Not on file  Social History Narrative   Not on file   Social Determinants of Health   Financial Resource Strain: Low Risk  (10/26/2022)   Overall Financial Resource Strain (CARDIA)    Difficulty of Paying Living Expenses: Not hard at all  Food Insecurity: No Food Insecurity (10/26/2022)   Hunger Vital Sign    Worried About Running Out of Food in the Last Year: Never true    Ran Out of Food in the Last Year: Never true  Transportation Needs: No Transportation Needs (10/26/2022)   PRAPARE - Hydrologist (Medical): No    Lack of Transportation (Non-Medical): No  Physical Activity: Sufficiently Active (10/26/2022)   Exercise Vital Sign    Days of Exercise per Week: 7 days    Minutes of Exercise per Session: 30 min  Stress: No Stress Concern Present (10/26/2022)   [place]    Feeling of Stress : Only a little  Social Connections: Not on file    Tobacco Counseling Ready to quit: Yes Counseling given: Not Answered   Clinical Intake:  Pre-visit preparation completed: Yes  Pain : 0-10 Pain Score: 2  Pain Type: Chronic pain Pain Location: Generalized Pain Descriptors / Indicators: Aching Pain Onset: More than a month ago Pain Frequency: Constant     Nutritional Status: BMI > 30  Obese Nutritional Risks: None Diabetes: Yes  How often do you need to have someone help you when you read  instructions, pamphlets, or other written materials from your doctor or pharmacy?: 1 - Never  Diabetic? Yes Nutrition Risk Assessment:  Has the patient had any N/V/D within the last 2 months?  No  Does the patient have any non-healing wounds?  No  Has the patient had any unintentional weight loss or weight gain?  No   Diabetes:  Is the patient diabetic?  Yes  If diabetic, was a CBG obtained today?  No  Did the patient bring in their glucometer from home?  No  How often do you monitor your CBG's? Does  not.   Financial Strains and Diabetes Management:  Are you having any financial strains with the device, your supplies or your medication? No .  Does the patient want to be seen by Chronic Care Management for management of their diabetes?  No  Would the patient like to be referred to a Nutritionist or for Diabetic Management?  No   Diabetic Exams:  Diabetic Eye Exam: Overdue for diabetic eye exam. Pt has been advised about the importance in completing this exam. Patient advised to call and schedule an eye exam. Diabetic Foot Exam: Completed 11/29/2021   Interpreter Needed?: No  Information entered by :: NAllen LPN   Activities of Daily Living    10/26/2022    1:08 PM  In your present state of health, do you have any difficulty performing the following activities:  Hearing? 1  Vision? 0  Difficulty concentrating or making decisions? 0  Walking or climbing stairs? 0  Dressing or bathing? 0  Doing errands, shopping? 0  Preparing Food and eating ? N  Using the Toilet? N  In the past six months, have you accidently leaked urine? Y  Do you have problems with loss of bowel control? N  Managing your Medications? N  Managing your Finances? N  Housekeeping or managing your Housekeeping? N    Patient Care Team: Libby Maw, MD as PCP - General (Family Medicine)  Indicate any recent Medical Services you may have received from other than [facility] providers in the past year  (date may be approximate).     Assessment:   This is a routine wellness examination for [place].  Hearing/Vision screen Vision Screening - Comments:: Regular eye exams, [facility]  Dietary issues and exercise activities discussed: Current Exercise Habits: Home exercise routine, Type of exercise: walking, Time (Minutes): 30, Frequency (Times/Week): 7, Weekly Exercise (Minutes/Week): 210   Goals Addressed             This Visit's Progress    Patient Stated       10/26/2022, wants to find an urologist       Depression Screen    10/26/2022    1:07 PM 11/29/2021    9:25 AM 08/30/2021   10:35 AM 04/28/2021    3:48 PM 10/31/2020    9:11 AM 10/24/2018    8:15 AM  PHQ 2/9 Scores  PHQ - 2 Score 1 0 1 0 0   Exception Documentation      Other- indicate reason in comment box  Not completed      Patient is followed by Psychiatry    Fall Risk    10/26/2022    1:06 PM 11/29/2021    9:25 AM 08/30/2021   10:35 AM 04/28/2021    3:48 PM 10/31/2020    9:11 AM  Fall Risk   Falls in the past year? 1 0 1 0 0  Comment tripped, knocked over by dogs      Number falls in past yr: 1 0 1    Injury with Fall? 1  0    Comment torn tendon, broke knee      Risk for fall due to : History of fall(s);Medication side effect      Follow up Falls prevention discussed;Education provided;Falls evaluation completed        FALL RISK PREVENTION PERTAINING TO THE HOME:  Any stairs in or around the home? Yes  If so, are there any without handrails? No  Home free of loose throw rugs in  walkways, pet beds, electrical cords, etc? Yes  Adequate lighting in your home to reduce risk of falls? Yes   ASSISTIVE DEVICES UTILIZED TO PREVENT FALLS:  Life alert? No  Use of a cane, walker or w/c? No  Grab bars in the bathroom? Yes  Shower chair or bench in shower? Yes  Elevated toilet seat or a handicapped toilet? No   TIMED UP AND GO:  Was the test performed? No .      Cognitive Function:         10/26/2022    1:10 PM  6CIT Screen  What Year? 0 points  What month? 0 points  What time? 0 points  Count back from 20 0 points  Months in reverse 0 points  Repeat phrase 2 points  Total Score 2 points    Immunizations Immunization History  Administered Date(s) Administered   Fluad Quad(high Dose 65+) 06/29/2022   Influenza,inj,Quad PF,6+ Mos 04/25/2018, 04/28/2019, 05/02/2020, 04/28/2021   Influenza-Unspecified 05/12/2016, 04/17/2017   Janssen (J&J) SARS-COV-2 Vaccination 08/01/2022   Moderna Sars-Covid-2 Vaccination 10/28/2019, 11/27/2019, 08/18/2020   Pneumococcal Polysaccharide-23 07/25/2017   Tdap 10/19/2015   Zoster Recombinat (Shingrix) 11/06/2021, 06/29/2022    TDAP status: Up to date  Flu Vaccine status: Up to date  Pneumococcal vaccine status: Up to date  Covid-19 vaccine status: Completed vaccines  Qualifies for Shingles Vaccine? Yes   Zostavax completed Yes   Shingrix Completed?: Yes  Screening Tests Health Maintenance  Topic Date Due   Lung Cancer Screening  11/15/2020   Diabetic kidney evaluation - Urine ACR  05/02/2022   HEMOGLOBIN A1C  05/31/2022   OPHTHALMOLOGY EXAM  07/24/2022   COLONOSCOPY (Pts 45-31yr Insurance coverage will need to be confirmed)  08/12/2022   Pneumonia Vaccine 66 Years old (2 of 2 - PCV) 11/30/2022 (Originally 07/25/2018)   Diabetic kidney evaluation - eGFR measurement  11/30/2022   FOOT EXAM  11/30/2022   Medicare Annual Wellness (AWV)  10/26/2023   MAMMOGRAM  11/15/2023   DTaP/Tdap/Td (2 - Td or Tdap) 10/18/2025   INFLUENZA VACCINE  Completed   DEXA SCAN  Completed   Hepatitis C Screening  Completed   Zoster Vaccines- Shingrix  Completed   HPV VACCINES  Aged Out   COVID-19 Vaccine  Discontinued    Health Maintenance  Health Maintenance Due  Topic Date Due   Lung Cancer Screening  11/15/2020   Diabetic kidney evaluation - Urine ACR  05/02/2022   HEMOGLOBIN A1C  05/31/2022   OPHTHALMOLOGY EXAM  07/24/2022    COLONOSCOPY (Pts 45-475yrInsurance coverage will need to be confirmed)  08/12/2022    Colorectal cancer screening: Type of screening: Colonoscopy. Completed 08/12/2017. Repeat every 5 years  Mammogram status: Completed 11/14/2021. Repeat every year  Bone Density status: Completed 10/10/2017.   Lung Cancer Screening: (Low Dose CT Chest recommended if Age 66-80ears, 30 pack-year currently smoking OR have quit w/in 15years.) does qualify.   Lung Cancer Screening Referral: insurance won't pay  Additional Screening:  Hepatitis C Screening: does qualify; Completed 10/23/2017  Vision Screening: Recommended annual ophthalmology exams for early detection of glaucoma and other disorders of the eye. Is the patient up to date with their annual eye exam?  Yes  Who is the provider or what is the name of the office in which the patient attends annual eye exams? [facility] If pt is not established with a provider, would they like to be referred to a provider to establish care? No .  Dental Screening: Recommended annual dental exams for proper oral hygiene  Community Resource Referral / Chronic Care Management: CRR required this visit?  No   CCM required this visit?  No      Plan:     I have personally reviewed and noted the following in the patient's chart:   Medical and social history Use of alcohol, tobacco or illicit drugs  Current medications and supplements including opioid prescriptions. Patient is not currently taking opioid prescriptions. Functional ability and status Nutritional status Physical activity Advanced directives List of other physicians Hospitalizations, surgeries, and ER visits in previous 12 months Vitals Screenings to include cognitive, depression, and falls Referrals and appointments  In addition, I have reviewed and discussed with patient certain preventive protocols, quality metrics, and best practice recommendations. A written personalized care  plan for preventive services as well as general preventive health recommendations were provided to patient.     Kellie Simmering, LPN   [id number]   Nurse Notes: none  Due to this being a virtual visit, the after visit summary with patients personalized plan was offered to patient via mail or my-chart.  to pick up at office at next visit

## 2022-10-26 NOTE — Patient Instructions (Signed)
[name] , Thank you for taking time to come for your Medicare Wellness Visit. I appreciate your ongoing commitment to your health goals. Please review the following plan we discussed and let me know if I can assist you in the future.   These are the goals we discussed:  Goals      Patient Stated     10/26/2022, wants to find an urologist        This is a list of the screening recommended for you and due dates:  Health Maintenance  Topic Date Due   Screening for Lung Cancer  11/15/2020   Yearly kidney health urinalysis for diabetes  05/02/2022   Hemoglobin A1C  05/31/2022   Eye exam for diabetics  07/24/2022   Colon Cancer Screening  08/12/2022   Pneumonia Vaccine (2 of 2 - PCV) 11/30/2022*   Yearly kidney function blood test for diabetes  11/30/2022   Complete foot exam   11/30/2022   Medicare Annual Wellness Visit  10/26/2023   Mammogram  11/15/2023   DTaP/Tdap/Td vaccine (2 - Td or Tdap) 10/18/2025   Flu Shot  Completed   DEXA scan (bone density measurement)  Completed   Hepatitis C Screening: USPSTF Recommendation to screen - Ages 44-79 yo.  Completed   Zoster (Shingles) Vaccine  Completed   HPV Vaccine  Aged Out   COVID-19 Vaccine  Discontinued  *Topic was postponed. The date shown is not the original due date.    Advanced directives: Please bring a copy of your POA (Power of Attorney) and/or Living Will to your next appointment.   Conditions/risks identified: smoking  Next appointment: Follow up in one year for your annual wellness visit    Preventive Care 16 Years and Older, Female Preventive care refers to lifestyle choices and visits with your health care provider that can promote health and wellness. What does preventive care include? A yearly physical exam. This is also called an annual well check. Dental exams once or twice a year. Routine eye exams. Ask your health care provider how often you should have your eyes checked. Personal lifestyle choices,  including: Daily care of your teeth and gums. Regular physical activity. Eating a healthy diet. Avoiding tobacco and drug use. Limiting alcohol use. Practicing safe sex. Taking low-dose aspirin every day. Taking vitamin and mineral supplements as recommended by your health care provider. What happens during an annual well check? The services and screenings done by your health care provider during your annual well check will depend on your age, overall health, lifestyle risk factors, and family history of disease. Counseling  Your health care provider may ask you questions about your: Alcohol use. Tobacco use. Drug use. Emotional well-being. Home and relationship well-being. Sexual activity. Eating habits. History of falls. Memory and ability to understand (cognition). Work and work Statistician. Reproductive health. Screening  You may have the following tests or measurements: Height, weight, and BMI. Blood pressure. Lipid and cholesterol levels. These may be checked every 5 years, or more frequently if you are over 35 years old. Skin check. Lung cancer screening. You may have this screening every year starting at age 66 if you have a 30-pack-year history of smoking and currently smoke or have quit within the past 15 years. Fecal occult blood test (FOBT) of the stool. You may have this test every year starting at age 66 Flexible sigmoidoscopy or colonoscopy. You may have a sigmoidoscopy every 5 years or a colonoscopy every 10 years starting at age 52.  Hepatitis C blood test. Hepatitis B blood test. Sexually transmitted disease (STD) testing. Diabetes screening. This is done by checking your blood sugar (glucose) after you have not eaten for a while (fasting). You may have this done every 1-3 years. Bone density scan. This is done to screen for osteoporosis. You may have this done starting at age 23. Mammogram. This may be done every 1-2 years. Talk to your health care provider  about how often you should have regular mammograms. Talk with your health care provider about your test results, treatment options, and if necessary, the need for more tests. Vaccines  Your health care provider may recommend certain vaccines, such as: Influenza vaccine. This is recommended every year. Tetanus, diphtheria, and acellular pertussis (Tdap, Td) vaccine. You may need a Td booster every 10 years. Zoster vaccine. You may need this after age 66 Pneumococcal 13-valent conjugate (PCV13) vaccine. One dose is recommended after age 66. Pneumococcal polysaccharide (PPSV23) vaccine. One dose is recommended after age 66. Talk to your health care provider about which screenings and vaccines you need and how often you need them. This information is not intended to replace advice given to you by your health care provider. Make sure you discuss any questions you have with your health care provider. Document Released: 09/02/2015 Document Revised: 04/25/2016 Document Reviewed: 06/07/2015 Elsevier Interactive Patient Education  2017 [place] Prevention in the Home Falls can cause injuries. They can happen to people of all ages. There are many things you can do to make your home safe and to help prevent falls. What can I do on the outside of my home? Regularly fix the edges of walkways and driveways and fix any cracks. Remove anything that might make you trip as you walk through a door, such as a raised step or threshold. Trim any bushes or trees on the path to your home. Use bright outdoor lighting. Clear any walking paths of anything that might make someone trip, such as rocks or tools. Regularly check to see if handrails are loose or broken. Make sure that both sides of any steps have handrails. Any raised decks and porches should have guardrails on the edges. Have any leaves, snow, or ice cleared regularly. Use sand or salt on walking paths during winter. Clean up any spills in  your garage right away. This includes oil or grease spills. What can I do in the bathroom? Use night lights. Install grab bars by the toilet and in the tub and shower. Do not use towel bars as grab bars. Use non-skid mats or decals in the tub or shower. If you need to sit down in the shower, use a plastic, non-slip stool. Keep the floor dry. Clean up any water that spills on the floor as soon as it happens. Remove soap buildup in the tub or shower regularly. Attach bath mats securely with double-sided non-slip rug tape. Do not have throw rugs and other things on the floor that can make you trip. What can I do in the bedroom? Use night lights. Make sure that you have a light by your bed that is easy to reach. Do not use any sheets or blankets that are too big for your bed. They should not hang down onto the floor. Have a firm chair that has side arms. You can use this for support while you get dressed. Do not have throw rugs and other things on the floor that can make you trip. What can I do in  the kitchen? Clean up any spills right away. Avoid walking on wet floors. Keep items that you use a lot in easy-to-reach places. If you need to reach something above you, use a strong step stool that has a grab bar. Keep electrical cords out of the way. Do not use floor polish or wax that makes floors slippery. If you must use wax, use non-skid floor wax. Do not have throw rugs and other things on the floor that can make you trip. What can I do with my stairs? Do not leave any items on the stairs. Make sure that there are handrails on both sides of the stairs and use them. Fix handrails that are broken or loose. Make sure that handrails are as long as the stairways. Check any carpeting to make sure that it is firmly attached to the stairs. Fix any carpet that is loose or worn. Avoid having throw rugs at the top or bottom of the stairs. If you do have throw rugs, attach them to the floor with carpet  tape. Make sure that you have a light switch at the top of the stairs and the bottom of the stairs. If you do not have them, ask someone to add them for you. What else can I do to help prevent falls? Wear shoes that: Do not have high heels. Have rubber bottoms. Are comfortable and fit you well. Are closed at the toe. Do not wear sandals. If you use a stepladder: Make sure that it is fully opened. Do not climb a closed stepladder. Make sure that both sides of the stepladder are locked into place. Ask someone to hold it for you, if possible. Clearly mark and make sure that you can see: Any grab bars or handrails. First and last steps. Where the edge of each step is. Use tools that help you move around (mobility aids) if they are needed. These include: Canes. Walkers. Scooters. Crutches. Turn on the lights when you go into a dark area. Replace any light bulbs as soon as they burn out. Set up your furniture so you have a clear path. Avoid moving your furniture around. If any of your floors are uneven, fix them. If there are any pets around you, be aware of where they are. Review your medicines with your doctor. Some medicines can make you feel dizzy. This can increase your chance of falling. Ask your doctor what other things that you can do to help prevent falls. This information is not intended to replace advice given to you by your health care provider. Make sure you discuss any questions you have with your health care provider. Document Released: 06/02/2009 Document Revised: 01/12/2016 Document Reviewed: 09/10/2014 Elsevier Interactive Patient Education  2017 [employer].

## 2022-10-31 ENCOUNTER — Ambulatory Visit (INDEPENDENT_AMBULATORY_CARE_PROVIDER_SITE_OTHER): Payer: Medicare Other | Admitting: Family Medicine

## 2022-10-31 ENCOUNTER — Encounter: Payer: Self-pay | Admitting: Family Medicine

## 2022-10-31 VITALS — BP 128/66 | HR 69 | Temp 97.9°F | Ht 63.0 in | Wt 201.2 lb

## 2022-10-31 DIAGNOSIS — Z720 Tobacco use: Secondary | ICD-10-CM | POA: Diagnosis not present

## 2022-10-31 DIAGNOSIS — I10 Essential (primary) hypertension: Secondary | ICD-10-CM | POA: Diagnosis not present

## 2022-10-31 DIAGNOSIS — E119 Type 2 diabetes mellitus without complications: Secondary | ICD-10-CM

## 2022-10-31 DIAGNOSIS — R0989 Other specified symptoms and signs involving the circulatory and respiratory systems: Secondary | ICD-10-CM

## 2022-10-31 DIAGNOSIS — Z780 Asymptomatic menopausal state: Secondary | ICD-10-CM | POA: Insufficient documentation

## 2022-10-31 DIAGNOSIS — M2550 Pain in unspecified joint: Secondary | ICD-10-CM

## 2022-10-31 DIAGNOSIS — E782 Mixed hyperlipidemia: Secondary | ICD-10-CM

## 2022-10-31 DIAGNOSIS — Z1231 Encounter for screening mammogram for malignant neoplasm of breast: Secondary | ICD-10-CM

## 2022-10-31 DIAGNOSIS — R32 Unspecified urinary incontinence: Secondary | ICD-10-CM

## 2022-10-31 LAB — COMPREHENSIVE METABOLIC PANEL
ALT: 18 U/L (ref 0–35)
AST: 15 U/L (ref 0–37)
Albumin: 4.2 g/dL (ref 3.5–5.2)
Alkaline Phosphatase: 89 U/L (ref 39–117)
BUN: 18 mg/dL (ref 6–23)
CO2: 27 mEq/L (ref 19–32)
Calcium: 9.4 mg/dL (ref 8.4–10.5)
Chloride: 105 mEq/L (ref 96–112)
Creatinine, Ser: 0.81 mg/dL (ref 0.40–1.20)
GFR: 75.86 mL/min (ref >60.00)
Glucose, Bld: 122 mg/dL — ABNORMAL HIGH (ref 70–99)
Potassium: 4.5 mEq/L (ref 3.5–5.1)
Sodium: 140 mEq/L (ref 135–145)
Total Bilirubin: 0.3 mg/dL (ref 0.2–1.2)
Total Protein: 6.4 g/dL (ref 6.0–8.3)

## 2022-10-31 LAB — LIPID PANEL
Cholesterol: 125 mg/dL (ref 0–200)
HDL: 48.7 mg/dL (ref >39.00)
NonHDL: 76.2
Total CHOL/HDL Ratio: 3
Triglycerides: 221 mg/dL — ABNORMAL HIGH (ref 0.0–149.0)
VLDL: 44.2 mg/dL — ABNORMAL HIGH (ref 0.0–40.0)

## 2022-10-31 LAB — URINALYSIS, ROUTINE W REFLEX MICROSCOPIC
Bilirubin Urine: NEGATIVE
Hgb urine dipstick: NEGATIVE
Ketones, ur: NEGATIVE
Leukocytes,Ua: NEGATIVE
Nitrite: NEGATIVE
Specific Gravity, Urine: >=1.03 — AB (ref 1.000–1.030)
Total Protein, Urine: NEGATIVE
Urine Glucose: NEGATIVE
Urobilinogen, UA: 0.2 (ref 0.0–1.0)
pH: 6 (ref 5.0–8.0)

## 2022-10-31 LAB — MICROALBUMIN / CREATININE URINE RATIO
Creatinine,U: 140.3 mg/dL
Microalb Creat Ratio: 0.9 mg/g (ref 0.0–30.0)
Microalb, Ur: 1.3 mg/dL (ref 0.0–1.9)

## 2022-10-31 LAB — CBC
HCT: 41.2 % (ref 36.0–46.0)
Hemoglobin: 13.9 g/dL (ref 12.0–15.0)
MCHC: 33.6 g/dL (ref 30.0–36.0)
MCV: 90.5 fl (ref 78.0–100.0)
Platelets: 367 10*3/uL (ref 150.0–400.0)
RBC: 4.56 Mil/uL (ref 3.87–5.11)
RDW: 14 % (ref 11.5–15.5)
WBC: 9.9 10*3/uL (ref 4.0–10.5)

## 2022-10-31 LAB — HEMOGLOBIN A1C: Hgb A1c MFr Bld: 6.9 % — ABNORMAL HIGH (ref 4.6–6.5)

## 2022-10-31 LAB — LDL CHOLESTEROL, DIRECT: Direct LDL: 53 mg/dL

## 2022-10-31 MED ORDER — MELOXICAM 7.5 MG PO TABS: 7.5000 mg | ORAL_TABLET | Freq: Every day | ORAL | 3 refills | Status: DC | PRN

## 2022-10-31 NOTE — Progress Notes (Signed)
Established Patient Office Visit   Subjective:  Patient ID: Paula Kelly, female    DOB: 06/19/1956  Age: 66 y.o. MRN: [id number]  Chief Complaint  Patient presents with   Annual Exam    CPE, concerns about arthritis worsening. Patient fasting.       HPI Encounter Diagnoses  Name Primary?   Essential hypertension Yes   Controlled type 2 diabetes mellitus without complication, without long-term current use of insulin (HCC)    Tobacco use    Diminished pulses in lower extremity    Breast cancer screening by mammogram    Postmenopausal estrogen deficiency    Urinary incontinence, unspecified type    Arthralgia, unspecified joint    For follow-up of above.  Doing well.  Continues Atacand for hypertension.  Continues high-dose atorvastatin and Zetia for elevated cholesterol.  Continues fish oil as well.  Continues metformin 500 twice daily.  Reports multiple achy joints including her right hip, neck, pends and others.  She is active physically by walking her dogs and working "in the dirt".  She is keeping up with gardens at her condo complex.  Psychiatry.  Is interested in quitting smoking.  Due for screening CT of the chest.  Has regular dental care.  Requests referral for urology for ongoing cough pends issues.  She is due for mammogram and DEXA scan.   Review of Systems  Constitutional: Negative.   HENT: Negative.    Eyes:  Negative for blurred vision, discharge and redness.  Respiratory: Negative.    Cardiovascular: Negative.   Gastrointestinal:  Negative for abdominal pain.  Genitourinary: Negative.   Musculoskeletal:  Positive for joint pain. Negative for myalgias.  Skin:  Negative for rash.  Neurological:  Negative for tingling, loss of consciousness and weakness.  Endo/Heme/Allergies:  Negative for polydipsia.     Current Outpatient Medications:    acetaminophen (TYLENOL) 500 MG tablet, Take by mouth., Disp: , Rfl:    amphetamine-dextroamphetamine (ADDERALL XR)  30 MG 24 hr capsule, Will start this prescription once the 20 mg prescription has run out., Disp: , Rfl:    aspirin EC 81 MG tablet, Take 1 tablet (81 mg total) by mouth daily., Disp: 365 tablet, Rfl: 0   atorvastatin (LIPITOR) 80 MG tablet, TAKE 1 TABLET(80 MG) BY MOUTH DAILY, Disp: 90 tablet, Rfl: 3   buPROPion (WELLBUTRIN XL) 150 MG 24 hr tablet, Take 1 tablet by mouth daily., Disp: , Rfl:    candesartan (ATACAND) 32 MG tablet, TAKE 1 TABLET(32 MG) BY MOUTH DAILY, Disp: 100 tablet, Rfl: 1   Cholecalciferol (VITAMIN D3) 50 MCG (2000 UT) TABS, Take 1 tablet by mouth daily., Disp: 90 tablet, Rfl: 2   Coenzyme Q10 200 MG capsule, , Disp: , Rfl:    diclofenac Sodium (VOLTAREN) 1 % GEL, Apply a small grape sized dollop to the left knee every 6 hours as needed., Disp: 150 g, Rfl: 1   DULoxetine (CYMBALTA) 60 MG capsule, Take 2 capsules (120 mg total) by mouth daily., Disp: 180 capsule, Rfl: 1   ezetimibe (ZETIA) 10 MG tablet, Take 1 tablet (10 mg total) by mouth daily., Disp: 90 tablet, Rfl: 3   fish oil-omega-3 fatty acids 1000 MG capsule, Take by mouth., Disp: , Rfl:    folic acid (FOLVITE) 1 MG tablet, Take 1 mg by mouth daily., Disp: , Rfl:    glucose blood (FREESTYLE LITE) test strip, Use to test 1-2 times daily., Disp: 100 each, Rfl: 5   meloxicam (MOBIC) 7.5  MG tablet, Take 1 tablet (7.5 mg total) by mouth daily as needed for pain., Disp: 30 tablet, Rfl: 3   metFORMIN (GLUCOPHAGE) 500 MG tablet, TAKE 1 TABLET(500 MG) BY MOUTH TWICE DAILY, Disp: 180 tablet, Rfl: 2   mometasone (NASONEX) 50 MCG/ACT nasal spray, Place 2 sprays into the nose daily., Disp: 1 each, Rfl: 12   Multiple Vitamin (MULTI-VITAMINS) TABS, Take 1 tablet by mouth daily., Disp: , Rfl:    QUEtiapine (SEROQUEL) 25 MG tablet, Take 25 mg by mouth at bedtime., Disp: , Rfl:    vitamin E 400 UNIT capsule, Take 1 capsule by mouth daily., Disp: , Rfl:    Ascorbic Acid (VITAMIN C) 100 MG tablet, Take 100 mg by mouth daily. (Patient not  taking: Reported on 10/31/2022), Disp: , Rfl:    Cyanocobalamin (VITAMIN B 12 PO), Take by mouth. (Patient not taking: Reported on 10/26/2022), Disp: , Rfl:    Objective:     There were no vitals taken for this visit. BP Readings from Last 3 Encounters:  12/06/21 140/60  11/29/21 (!) 162/70  11/13/21 (!) 148/60      Physical Exam Constitutional:      General: She is not in acute distress.    Appearance: Normal appearance. She is not ill-appearing, toxic-appearing or diaphoretic.  HENT:     Head: Normocephalic and atraumatic.     Right Ear: External ear normal.     Left Ear: External ear normal.     Mouth/Throat:     Mouth: Mucous membranes are moist.     Pharynx: Oropharynx is clear. No oropharyngeal exudate or posterior oropharyngeal erythema.  Eyes:     General: No scleral icterus.       Right eye: No discharge.        Left eye: No discharge.     Extraocular Movements: Extraocular movements intact.     Conjunctiva/sclera: Conjunctivae normal.     Pupils: Pupils are equal, round, and reactive to light.  Cardiovascular:     Rate and Rhythm: Normal rate and regular rhythm.  Pulmonary:     Effort: Pulmonary effort is normal. No respiratory distress.     Breath sounds: Normal breath sounds.  Abdominal:     General: Bowel sounds are normal.     Tenderness: There is no abdominal tenderness. There is no guarding.  Musculoskeletal:     Cervical back: No rigidity or tenderness.  Skin:    General: Skin is warm and dry.  Neurological:     Mental Status: She is alert and oriented to person, place, and time.  Psychiatric:        Mood and Affect: Mood normal.        Behavior: Behavior normal.    The 10-year ASCVD risk score (Arnett DK, et al., 2019) is: 24%   Values used to calculate the score:     Age: 74 years     Sex: Female     Is Non-Hispanic African American: No     Diabetic: Yes     Tobacco smoker: Yes     Systolic Blood Pressure: 0000000 mmHg     Is BP treated: Yes      HDL Cholesterol: 51.6 mg/dL     Total Cholesterol: 154 mg/dL   No results found for any visits on 10/31/22.    The ASCVD Risk score (Arnett DK, et al., 2019) failed to calculate for the following reasons:   The systolic blood pressure is missing    Assessment &  Plan:   Essential hypertension -     CBC -     Comprehensive metabolic panel  Controlled type 2 diabetes mellitus without complication, without long-term current use of insulin (HCC) -     Comprehensive metabolic panel -     Hemoglobin A1c -     Lipid panel -     Urinalysis, Routine w reflex microscopic -     Microalbumin / creatinine urine ratio  Tobacco use -     Ambulatory Referral for Lung Cancer Scre  Diminished pulses in lower extremity -     US ARTERIAL ABI (SCREENING LOWER EXTREMITY); Future  Breast cancer screening by mammogram -     Digital Screening Mammogram, Left and Right; Future  Postmenopausal estrogen deficiency -     DG Bone Density; Future  Urinary incontinence, unspecified type -     Ambulatory referral to Urology  Arthralgia, unspecified joint -     Meloxicam; Take 1 tablet (7.5 mg total) by mouth daily as needed for pain.  Dispense: 30 tablet; Refill: 3    Return in about 6 months (around 05/03/2023).  Will catch up with health maintenance as above.  We briefly discussed using Chantix for smoking cessation.  She will consider it.  She will use the meloxicam as needed is much as possible.  Suggested that she take it daily for a month and then back off to as needed.  Libby Maw, MD

## 2022-11-01 ENCOUNTER — Encounter: Payer: Self-pay | Admitting: Family Medicine

## 2022-11-20 ENCOUNTER — Telehealth (HOSPITAL_BASED_OUTPATIENT_CLINIC_OR_DEPARTMENT_OTHER): Payer: Self-pay

## 2022-11-26 LAB — HM DIABETES EYE EXAM

## 2022-12-03 ENCOUNTER — Encounter: Payer: Self-pay | Admitting: *Deleted

## 2022-12-06 ENCOUNTER — Telehealth (HOSPITAL_BASED_OUTPATIENT_CLINIC_OR_DEPARTMENT_OTHER): Payer: Self-pay

## 2023-01-04 ENCOUNTER — Other Ambulatory Visit: Payer: Self-pay | Admitting: Family Medicine

## 2023-01-04 DIAGNOSIS — E119 Type 2 diabetes mellitus without complications: Secondary | ICD-10-CM

## 2023-01-09 ENCOUNTER — Inpatient Hospital Stay (HOSPITAL_BASED_OUTPATIENT_CLINIC_OR_DEPARTMENT_OTHER): Admission: RE | Admit: 2023-01-09 | Payer: Medicare Other | Source: Ambulatory Visit

## 2023-01-09 ENCOUNTER — Other Ambulatory Visit (HOSPITAL_BASED_OUTPATIENT_CLINIC_OR_DEPARTMENT_OTHER): Payer: Medicare Other

## 2023-01-14 ENCOUNTER — Other Ambulatory Visit: Payer: Self-pay | Admitting: Family Medicine

## 2023-01-14 DIAGNOSIS — E7800 Pure hypercholesterolemia, unspecified: Secondary | ICD-10-CM

## 2023-01-17 ENCOUNTER — Ambulatory Visit (INDEPENDENT_AMBULATORY_CARE_PROVIDER_SITE_OTHER): Payer: Medicare Other | Admitting: Family Medicine

## 2023-01-17 ENCOUNTER — Encounter: Payer: Self-pay | Admitting: Family Medicine

## 2023-01-17 VITALS — BP 146/68 | Ht 63.0 in | Wt 201.0 lb

## 2023-01-17 DIAGNOSIS — G5701 Lesion of sciatic nerve, right lower limb: Secondary | ICD-10-CM | POA: Diagnosis present

## 2023-01-17 NOTE — Patient Instructions (Signed)
You have strained the external rotators of your hip. Try to avoid painful activities when possible. Start physical therapy and do home exercises on days you don't go to therapy. Pick 2-3 stretches where you feel the pull in the area of pain - do 3 of these and hold for 20-30 seconds twice a day. Aleve 2 tabs twice a day with food for pain and inflammation as needed. Follow up with me in 6 weeks.

## 2023-01-18 ENCOUNTER — Encounter: Payer: Self-pay | Admitting: Family Medicine

## 2023-01-18 NOTE — Progress Notes (Signed)
PCP: Mliss Sax, MD  Subjective:   HPI: Patient is a 67 y.o. female here for right hip pain.  Patient reports her hamstring pain resolved following PRP last year. Current pain is different, more superior in posterior right hip. Pain worse after walking about 5-10 minutes. Started up when she was on a trip to [place] and [place], had to stop several times due to the pain. No radiation into her leg. No bowel/bladder dysfunction. No numbness/tingling.  Past Medical History:  Diagnosis Date   Allergy    seasonal   Arthritis    Cataract    Depression    Diabetes mellitus without complication (HCC)    H/O fibromyalgia    had for over 10 years   Hyperlipidemia    Hypertension    Post-operative nausea and vomiting     Current Outpatient Medications on File Prior to Visit  Medication Sig Dispense Refill   acetaminophen (TYLENOL) 500 MG tablet Take by mouth.     amphetamine-dextroamphetamine (ADDERALL XR) 30 MG 24 hr capsule Will start this prescription once the 20 mg prescription has run out.     Ascorbic Acid (VITAMIN C) 100 MG tablet Take 100 mg by mouth daily. (Patient not taking: Reported on 10/31/2022)     aspirin EC 81 MG tablet Take 1 tablet (81 mg total) by mouth daily. 365 tablet 0   atorvastatin (LIPITOR) 80 MG tablet TAKE 1 TABLET(80 MG) BY MOUTH DAILY 90 tablet 3   buPROPion (WELLBUTRIN XL) 150 MG 24 hr tablet Take 1 tablet by mouth daily.     candesartan (ATACAND) 32 MG tablet TAKE 1 TABLET(32 MG) BY MOUTH DAILY 100 tablet 1   Cholecalciferol (VITAMIN D3) 50 MCG (2000 UT) TABS Take 1 tablet by mouth daily. 90 tablet 2   Coenzyme Q10 200 MG capsule      Cyanocobalamin (VITAMIN B 12 PO) Take by mouth. (Patient not taking: Reported on 10/26/2022)     diclofenac Sodium (VOLTAREN) 1 % GEL Apply a small grape sized dollop to the left knee every 6 hours as needed. 150 g 1   DULoxetine (CYMBALTA) 60 MG capsule Take 2 capsules (120 mg total) by mouth daily. 180 capsule 1    ezetimibe (ZETIA) 10 MG tablet TAKE 1 TABLET(10 MG) BY MOUTH DAILY 90 tablet 3   fish oil-omega-3 fatty acids 1000 MG capsule Take by mouth.     folic acid (FOLVITE) 1 MG tablet Take 1 mg by mouth daily.     glucose blood (FREESTYLE LITE) test strip Use to test 1-2 times daily. 100 each 5   meloxicam (MOBIC) 7.5 MG tablet Take 1 tablet (7.5 mg total) by mouth daily as needed for pain. 30 tablet 3   metFORMIN (GLUCOPHAGE) 500 MG tablet TAKE 1 TABLET(500 MG) BY MOUTH TWICE DAILY 180 tablet 2   mometasone (NASONEX) 50 MCG/ACT nasal spray Place 2 sprays into the nose daily. 1 each 12   Multiple Vitamin (MULTI-VITAMINS) TABS Take 1 tablet by mouth daily.     QUEtiapine (SEROQUEL) 25 MG tablet Take 25 mg by mouth at bedtime.     vitamin E 400 UNIT capsule Take 1 capsule by mouth daily.     No current facility-administered medications on file prior to visit.    Past Surgical History:  Procedure Laterality Date   APPENDECTOMY     COLONOSCOPY     kidney stone removal  2013   used a basket extraction    Allergies  Allergen Reactions  Metronidazole Rash    BP (!) 146/68 (BP Location: Left Arm, Patient Position: Sitting)   Ht 5\' 3"  (1.6 m)   Wt 201 lb (91.2 kg)   BMI 35.61 kg/m       No data to display              No data to display              Objective:  Physical Exam:  Gen: NAD, comfortable in exam room  Back: No gross deformity, scoliosis. No paraspinal TTP.  No midline or bony TTP. FROM without reproduction of pain. Strength LEs 5/5 all muscle groups.   2+ MSRs in patellar and achilles tendons, equal bilaterally. Negative SLRs. Sensation intact to light touch bilaterally.  Right hip: No deformity. FROM with 5/5 strength except 4/5 hip abduction. Tenderness to palpation over piriformis.  No other tenderness. NVI distally. Negative logroll Negative faber, fadir, and piriformis stretches.   Assessment & Plan:  1. Right hip pain - 2/2 overuse strain  of hip external rotators, piriformis.  Start physical therapy and home exercises.  Aleve as needed.  F/u in 6 weeks.

## 2023-01-28 ENCOUNTER — Telehealth: Payer: Self-pay | Admitting: Family Medicine

## 2023-01-28 DIAGNOSIS — I10 Essential (primary) hypertension: Secondary | ICD-10-CM

## 2023-01-28 DIAGNOSIS — E119 Type 2 diabetes mellitus without complications: Secondary | ICD-10-CM

## 2023-01-28 MED ORDER — CANDESARTAN CILEXETIL 32 MG PO TABS: 32.0000 mg | ORAL_TABLET | Freq: Every day | ORAL | 0 refills | Status: DC

## 2023-01-28 NOTE — Telephone Encounter (Signed)
Patient called and said the pharmacy is waiting on authorization for candesartan (ATACAND) 32 MG tablet [[id number]]

## 2023-01-29 ENCOUNTER — Ambulatory Visit: Payer: Medicare Other | Attending: Family Medicine

## 2023-01-29 ENCOUNTER — Other Ambulatory Visit: Payer: Self-pay

## 2023-01-29 DIAGNOSIS — G5701 Lesion of sciatic nerve, right lower limb: Secondary | ICD-10-CM | POA: Diagnosis not present

## 2023-01-29 DIAGNOSIS — M25551 Pain in right hip: Secondary | ICD-10-CM | POA: Diagnosis present

## 2023-01-29 DIAGNOSIS — R262 Difficulty in walking, not elsewhere classified: Secondary | ICD-10-CM | POA: Insufficient documentation

## 2023-01-29 DIAGNOSIS — R29898 Other symptoms and signs involving the musculoskeletal system: Secondary | ICD-10-CM | POA: Insufficient documentation

## 2023-01-29 NOTE — Therapy (Signed)
OUTPATIENT PHYSICAL THERAPY LOWER EXTREMITY EVALUATION   Patient Name: Paula Kelly MRN: [id number] DOB:07-23-1956, 67 y.o., female Today's Date: 01/29/2023  END OF SESSION:  PT End of Session - 01/29/23 1311     Date for PT Re-Evaluation 03/26/23    Progress Note Due on Visit 10    PT Start Time 1400    PT Stop Time 1445    PT Time Calculation (min) 45 min    Activity Tolerance Patient tolerated treatment well    Behavior During Therapy [facility] for tasks assessed/performed             Past Medical History:  Diagnosis Date   Allergy    seasonal   Arthritis    Cataract    Depression    Diabetes mellitus without complication (HCC)    H/O fibromyalgia    had for over 10 years   Hyperlipidemia    Hypertension    Post-operative nausea and vomiting    Past Surgical History:  Procedure Laterality Date   APPENDECTOMY     COLONOSCOPY     kidney stone removal  2013   used a basket extraction   Patient Active Problem List   Diagnosis Date Noted   Urinary incontinence 10/31/2022   Postmenopausal estrogen deficiency 10/31/2022   Diminished pulses in lower extremity 11/29/2021   Closed fracture of lateral portion of left tibial plateau 11/13/2021   Hamstring tendinitis at origin 08/30/2021   Depression, recurrent (HCC) 08/30/2021   Malaise 08/30/2021   Other fatigue 08/30/2021   Post-viral cough syndrome 08/30/2021   Stress due to illness of family member 08/30/2021   Capsulitis of right shoulder 07/21/2020   Xerostomia 06/09/2020   Herpes zoster without complication 06/09/2020   Aphthous ulcer 06/09/2020   Dislocation of jaw 06/09/2020   Urge incontinence 05/02/2020   Chronic obstructive pulmonary disease (HCC) 11/23/2019   Strain of left hamstring 08/05/2019   Capsulitis of left shoulder 07/10/2019   Plantar fasciitis of left foot 04/28/2019   Excessive daytime sleepiness 04/28/2019   Snores 04/28/2019   Vitamin D deficiency 04/28/2019   Pain of left calf  11/18/2017   Attention deficit hyperactivity disorder (ADHD), predominantly hyperactive type 07/25/2017   Essential hypertension 07/25/2017   Controlled type 2 diabetes mellitus without complication, without long-term current use of insulin (HCC) 07/25/2017   Elevated cholesterol 07/25/2017   Encounter for hepatitis C screening test for low risk patient 07/25/2017   Tobacco use 07/25/2017   Health maintenance alteration 07/25/2017   Breast cancer screening by mammogram 07/25/2017   Need for 23-polyvalent pneumococcal polysaccharide vaccine 07/25/2017   Arthralgia 07/25/2017    PCP: Mliss Sax, MD  REFERRING PROVIDER: Dorann Lodge, MD  REFERRING DIAG: R hip/ piriformis pain  THERAPY DIAG:  Difficulty in walking, not elsewhere classified  Pain in right hip  Weakness of right hip  Rationale for Evaluation and Treatment: Rehabilitation  ONSET DATE: 6 months or more   SUBJECTIVE:   SUBJECTIVE STATEMENT: If I lie around and am a slug it doesn't hurt.    PERTINENT HISTORY: Patient reports torn insertional tendon R post hip last year treated with stem cell injection which worked.  Now pain more laterally in post hip. Hurts when more active, with prolonged walking.   PAIN:  Are you having pain? Yes: NPRS scale: 0 to 6/10 Pain location: R post/lat hip/ gluteals Pain description: deep pain resolves with being still Aggravating factors: walking, being upright on feet Relieving factors: resting  PRECAUTIONS: None  WEIGHT BEARING RESTRICTIONS: No  FALLS:  Has patient fallen in last 6 months? No  LIVING ENVIRONMENT: Lives with: lives alone Lives in: House/apartment Stairs: Yes: External: 1 steps; none Has following equipment at home: None  OCCUPATION: retired   PLOF: Independent  PATIENT GOALS: to be able to walk and travel without this pain  NEXT MD VISIT: none scheduled  OBJECTIVE:   DIAGNOSTIC FINDINGS: bone density scheduled for 01/30/23, no other  diagnostics noted  PATIENT SURVEYS:  Lefs 40/80 OR 50%  COGNITION: Overall cognitive status: Within functional limits for tasks assessed     SENSATION: WFL  EDEMA:  NONE NOTED MUSCLE LENGTH:all wnl   POSTURE:  B hips ER in standing and with gait  PALPATION: Moderate general tenderness R gluteals particularly over piriformis region  LOWER EXTREMITY ROM: generally hypermobile, except noted limitation B hip IR to 5 degrees, R painful.  R hip abduction decreased 15 degrees compared to L  R hip flexion decreased 15 degrees and with some groin pain as compared to L  LOWER EXTREMITY MMT:  MMT Right eval Left eval  Hip flexion 4/5 p! All 5/5  Hip extension 4/5   Hip abduction    Hip adduction    Hip internal rotation    Hip external rotation 4/5   Knee flexion    Knee extension    Ankle dorsiflexion    Ankle plantarflexion    Ankle inversion    Ankle eversion       LOWER EXTREMITY SPECIAL TESTS:  Hip special tests: Luisa Hart (FABER) test: negative, Trendelenburg test: positive , and Hip scouring test: positive  all on R hip  FUNCTIONAL TESTS:  30 seconds chair stand test 12 reps 6 minute walk test: walked 2 min 30 sec, at 350 or 1 min 40 sec into walk noted increased R hip trendelenburg and pain noted by patient deep in hip  GAIT: Distance walked: 350' Assistive device utilized: None Level of assistance: Complete Independence Comments: + trendelenburg R , B hips ER   TODAY'S TREATMENT:                                                                                                                              DATE: 01/29/23: Prone for deep cross friction massage with massage gun over gluteals  Instructed in seated isometric hip abd with strap, to perform 2 to 4 x daily 45 sec 3 sets     PATIENT EDUCATION:  Education details: POC, goals Person educated: Patient Education method: Explanation, Demonstration, Tactile cues, and Verbal cues Education comprehension:  verbalized understanding and returned demonstration  HOME EXERCISE PROGRAM: Isometric hip abd/ER  ASSESSMENT:  CLINICAL IMPRESSION: Patient is a 67 y.o. female who was seen today for physical therapy evaluation and treatment for R lat /post hip pain, which was exacerbated recently on a trip involving a lot of walking. She presents with decreased ROM R hip for IR and flexion, abduction with groin pain  and R post hip pain. Her walking is compromised, she had increased Sx today at 38' which is below community ambulation distances.She also presents with decreased strength, specifically R hip for ER. Will benefit from skilled PT to address these Sx.  Only wants 3 visits.  Also may need R hip x ray   OBJECTIVE IMPAIRMENTS: decreased activity tolerance, difficulty walking, decreased ROM, decreased strength, and pain.   ACTIVITY LIMITATIONS: carrying, lifting, bending, stairs, and locomotion level  PARTICIPATION LIMITATIONS: cleaning, shopping, and community activity  PERSONAL FACTORS: Age, Behavior pattern, Fitness, Past/current experiences, Time since onset of injury/illness/exacerbation, and 1-2 comorbidities: DM, depression  are also affecting patient's functional outcome.   REHAB POTENTIAL: Good  CLINICAL DECISION MAKING: Stable/uncomplicated  EVALUATION COMPLEXITY: Low   GOALS: Goals reviewed with patient? Yes  SHORT TERM GOALS: Target date: 2 weeks, 02/12/23 I HEP Baseline: established today Goal status: INITIAL   LONG TERM GOALS: Target date: 03/26/23, 8 weeks  Improve LEFS from 50% deficit to 40% deficit Baseline:  Goal status: INITIAL  2.  Improve strength R hip ER/ext to 5/5 for better prolonged walking  Baseline: 4/5 Goal status: INITIAL  3.  Improve gait tolerance, able to complete 6 min walk test without R hip pain Baseline: 2 min 30 sec Goal status: INITIAL  4.  Improve 30 sec sit to stand from 12 to 15 Baseline:  Goal status: INITIAL    PLAN:  PT  FREQUENCY: 1-2x/week  PT DURATION: 8 weeks  PLANNED INTERVENTIONS: Therapeutic exercises, Therapeutic activity, Neuromuscular re-education, Balance training, Gait training, Patient/Family education, Self Care, and Joint mobilization  PLAN FOR NEXT SESSION: assess for dry needling, assess strength and gait again    Graeden Bitner L Brenae Lasecki, PT 01/29/2023, 5:20 PM

## 2023-01-30 ENCOUNTER — Other Ambulatory Visit (HOSPITAL_BASED_OUTPATIENT_CLINIC_OR_DEPARTMENT_OTHER): Payer: Medicare Other

## 2023-01-30 ENCOUNTER — Ambulatory Visit (HOSPITAL_BASED_OUTPATIENT_CLINIC_OR_DEPARTMENT_OTHER): Payer: Medicare Other

## 2023-01-30 ENCOUNTER — Other Ambulatory Visit: Payer: Self-pay

## 2023-02-05 ENCOUNTER — Other Ambulatory Visit: Payer: Self-pay

## 2023-02-05 ENCOUNTER — Ambulatory Visit: Payer: Medicare Other

## 2023-02-05 DIAGNOSIS — R262 Difficulty in walking, not elsewhere classified: Secondary | ICD-10-CM | POA: Diagnosis not present

## 2023-02-05 DIAGNOSIS — M25551 Pain in right hip: Secondary | ICD-10-CM

## 2023-02-05 DIAGNOSIS — R29898 Other symptoms and signs involving the musculoskeletal system: Secondary | ICD-10-CM

## 2023-02-05 NOTE — Therapy (Signed)
OUTPATIENT PHYSICAL THERAPY LOWER EXTREMITY EVALUATION   Patient Name: Paula Kelly MRN: [id number] DOB:02/24/1956, 67 y.o., female Today's Date: 02/05/2023  END OF SESSION:  PT End of Session - 02/05/23 1821     Visit Number 2    Date for PT Re-Evaluation 03/26/23    Progress Note Due on Visit 10    PT Start Time 1345    PT Stop Time 1430    PT Time Calculation (min) 45 min    Activity Tolerance Patient tolerated treatment well    Behavior During Therapy [facility] for tasks assessed/performed              Past Medical History:  Diagnosis Date   Allergy    seasonal   Arthritis    Cataract    Depression    Diabetes mellitus without complication (HCC)    H/O fibromyalgia    had for over 10 years   Hyperlipidemia    Hypertension    Post-operative nausea and vomiting    Past Surgical History:  Procedure Laterality Date   APPENDECTOMY     COLONOSCOPY     kidney stone removal  2013   used a basket extraction   Patient Active Problem List   Diagnosis Date Noted   Urinary incontinence 10/31/2022   Postmenopausal estrogen deficiency 10/31/2022   Diminished pulses in lower extremity 11/29/2021   Closed fracture of lateral portion of left tibial plateau 11/13/2021   Hamstring tendinitis at origin 08/30/2021   Depression, recurrent (HCC) 08/30/2021   Malaise 08/30/2021   Other fatigue 08/30/2021   Post-viral cough syndrome 08/30/2021   Stress due to illness of family member 08/30/2021   Capsulitis of right shoulder 07/21/2020   Xerostomia 06/09/2020   Herpes zoster without complication 06/09/2020   Aphthous ulcer 06/09/2020   Dislocation of jaw 06/09/2020   Urge incontinence 05/02/2020   Chronic obstructive pulmonary disease (HCC) 11/23/2019   Strain of left hamstring 08/05/2019   Capsulitis of left shoulder 07/10/2019   Plantar fasciitis of left foot 04/28/2019   Excessive daytime sleepiness 04/28/2019   Snores 04/28/2019   Vitamin D deficiency 04/28/2019    Pain of left calf 11/18/2017   Attention deficit hyperactivity disorder (ADHD), predominantly hyperactive type 07/25/2017   Essential hypertension 07/25/2017   Controlled type 2 diabetes mellitus without complication, without long-term current use of insulin (HCC) 07/25/2017   Elevated cholesterol 07/25/2017   Encounter for hepatitis C screening test for low risk patient 07/25/2017   Tobacco use 07/25/2017   Health maintenance alteration 07/25/2017   Breast cancer screening by mammogram 07/25/2017   Need for 23-polyvalent pneumococcal polysaccharide vaccine 07/25/2017   Arthralgia 07/25/2017    PCP: Mliss Sax, MD  REFERRING PROVIDER: Dorann Lodge, MD  REFERRING DIAG: R hip/ piriformis pain  THERAPY DIAG:  Difficulty in walking, not elsewhere classified  Pain in right hip  Weakness of right hip  Rationale for Evaluation and Treatment: Rehabilitation  ONSET DATE: 6 months or more   SUBJECTIVE:   SUBJECTIVE STATEMENT: If I lie around and am a slug it doesn't hurt.    PERTINENT HISTORY: Patient reports torn insertional tendon R post hip last year treated with stem cell injection which worked.  Now pain more laterally in post hip. Hurts when more active, with prolonged walking.   PAIN:  Are you having pain? Yes: NPRS scale: 0 to 6/10 Pain location: R post/lat hip/ gluteals Pain description: deep pain resolves with being still Aggravating factors: walking, being upright on  feet Relieving factors: resting  PRECAUTIONS: None  WEIGHT BEARING RESTRICTIONS: No  FALLS:  Has patient fallen in last 6 months? No  LIVING ENVIRONMENT: Lives with: lives alone Lives in: House/apartment Stairs: Yes: External: 1 steps; none Has following equipment at home: None  OCCUPATION: retired   PLOF: Independent  PATIENT GOALS: to be able to walk and travel without this pain  NEXT MD VISIT: none scheduled  OBJECTIVE:   DIAGNOSTIC FINDINGS: bone density scheduled  for 01/30/23, no other diagnostics noted  PATIENT SURVEYS:  Lefs 40/80 OR 50%  COGNITION: Overall cognitive status: Within functional limits for tasks assessed     SENSATION: WFL  EDEMA:  NONE NOTED MUSCLE LENGTH:all wnl   POSTURE:  B hips ER in standing and with gait  PALPATION: Moderate general tenderness R gluteals particularly over piriformis region  LOWER EXTREMITY ROM: generally hypermobile, except noted limitation B hip IR to 5 degrees, R painful.  R hip abduction decreased 15 degrees compared to L  R hip flexion decreased 15 degrees and with some groin pain as compared to L  LOWER EXTREMITY MMT:  MMT Right eval Left eval  Hip flexion 4/5 p! All 5/5  Hip extension 4/5   Hip abduction    Hip adduction    Hip internal rotation    Hip external rotation 4/5   Knee flexion    Knee extension    Ankle dorsiflexion    Ankle plantarflexion    Ankle inversion    Ankle eversion       LOWER EXTREMITY SPECIAL TESTS:  Hip special tests: Luisa Hart (FABER) test: negative, Trendelenburg test: positive , and Hip scouring test: positive  all on R hip  FUNCTIONAL TESTS:  30 seconds chair stand test 12 reps 6 minute walk test: walked 2 min 30 sec, at 350 or 1 min 40 sec into walk noted increased R hip trendelenburg and pain noted by patient deep in hip  GAIT: Distance walked: 350' Assistive device utilized: None Level of assistance: Complete Independence Comments: + trendelenburg R , B hips ER   TODAY'S TREATMENT:                                                                                                                              DATE: 02/05/23: L sidelying for dry needling.   Trigger Point Dry-Needling  Treatment instructions: Expect mild to moderate muscle soreness. S/S of pneumothorax if dry needled over a lung field, and to seek immediate medical attention should they occur. Patient verbalized understanding of these instructions and education. Patient Consent  Given: Yes Education handout provided: No Muscles treated: R TFL, R gluteus medius Electrical stimulation performed: No Parameters: N/A Treatment response/outcome: Twitch Response Elicited  Cross friction massage in prone R glut medius, R TFL with theragun, to increase blood flow and tissue extensibility R lateral and post hip  Supine for distraction laterally R femoral head, combined with mobilization with movement R hip for ER  stretch.  Therex: progressed intensity of strengthening ex for R hip abd and for R gluteals and added to home program.   Gait after Rx in hallway, able to walk 2 lengths without pain or notable compensation R lat hip  01/29/23: Prone for deep cross friction massage with massage gun over gluteals  Instructed in seated isometric hip abd with strap, to perform 2 to 4 x daily 45 sec 3 sets     PATIENT EDUCATION:  Education details: POC, goals Person educated: Patient Education method: Explanation, Demonstration, Tactile cues, and Verbal cues Education comprehension: verbalized understanding and returned demonstration  HOME EXERCISE PROGRAM: Isometric hip abd/ER  ASSESSMENT:  CLINICAL IMPRESSION: Patient is a 67 y.o. female who was seen today for physical therapy treatment for R lat /post hip pain, which was exacerbated recently on a trip involving a lot of walking. Improved Sx overall, not sure whether due to increased overall activity or from initial Rx and therex.  Today responded well to all manual tech and to strengthening progression. Will benefit from skilled PT to address these Sx.  Only wants 3 visits.  Also may need R hip x ray   OBJECTIVE IMPAIRMENTS: decreased activity tolerance, difficulty walking, decreased ROM, decreased strength, and pain.   ACTIVITY LIMITATIONS: carrying, lifting, bending, stairs, and locomotion level  PARTICIPATION LIMITATIONS: cleaning, shopping, and community activity  PERSONAL FACTORS: Age, Behavior pattern, Fitness,  Past/current experiences, Time since onset of injury/illness/exacerbation, and 1-2 comorbidities: DM, depression  are also affecting patient's functional outcome.   REHAB POTENTIAL: Good  CLINICAL DECISION MAKING: Stable/uncomplicated  EVALUATION COMPLEXITY: Low   GOALS: Goals reviewed with patient? Yes  SHORT TERM GOALS: Target date: 2 weeks, 02/12/23 I HEP Baseline: established today Goal status: INITIAL   LONG TERM GOALS: Target date: 03/26/23, 8 weeks  Improve LEFS from 50% deficit to 40% deficit Baseline:  Goal status: INITIAL  2.  Improve strength R hip ER/ext to 5/5 for better prolonged walking  Baseline: 4/5 Goal status: INITIAL  3.  Improve gait tolerance, able to complete 6 min walk test without R hip pain Baseline: 2 min 30 sec Goal status: INITIAL  4.  Improve 30 sec sit to stand from 12 to 15 Baseline:  Goal status: INITIAL    PLAN:  PT FREQUENCY: 1-2x/week  PT DURATION: 8 weeks  PLANNED INTERVENTIONS: Therapeutic exercises, Therapeutic activity, Neuromuscular re-education, Balance training, Gait training, Patient/Family education, Self Care, and Joint mobilization  PLAN FOR NEXT SESSION: assess for dry needling, assess strength and gait again    Karthika Glasper L Ashlyne Olenick, PT 02/05/2023, 6:29 PM

## 2023-02-08 ENCOUNTER — Telehealth: Payer: Self-pay

## 2023-02-08 ENCOUNTER — Encounter: Payer: Self-pay | Admitting: Family Medicine

## 2023-02-08 ENCOUNTER — Ambulatory Visit (INDEPENDENT_AMBULATORY_CARE_PROVIDER_SITE_OTHER): Payer: Medicare Other | Admitting: Family Medicine

## 2023-02-08 VITALS — BP 168/84 | HR 121 | Temp 97.4°F | Ht 63.0 in | Wt 212.8 lb

## 2023-02-08 DIAGNOSIS — I10 Essential (primary) hypertension: Secondary | ICD-10-CM | POA: Diagnosis not present

## 2023-02-08 DIAGNOSIS — Z7984 Long term (current) use of oral hypoglycemic drugs: Secondary | ICD-10-CM

## 2023-02-08 DIAGNOSIS — E119 Type 2 diabetes mellitus without complications: Secondary | ICD-10-CM | POA: Diagnosis not present

## 2023-02-08 DIAGNOSIS — R0989 Other specified symptoms and signs involving the circulatory and respiratory systems: Secondary | ICD-10-CM | POA: Diagnosis not present

## 2023-02-08 LAB — BASIC METABOLIC PANEL
BUN: 16 mg/dL (ref 6–23)
CO2: 26 mEq/L (ref 19–32)
Calcium: 9.1 mg/dL (ref 8.4–10.5)
Chloride: 107 mEq/L (ref 96–112)
Creatinine, Ser: 0.78 mg/dL (ref 0.40–1.20)
GFR: 79.22 mL/min (ref >60.00)
Glucose, Bld: 79 mg/dL (ref 70–99)
Potassium: 4.4 mEq/L (ref 3.5–5.1)
Sodium: 141 mEq/L (ref 135–145)

## 2023-02-08 LAB — HEMOGLOBIN A1C: Hgb A1c MFr Bld: 6.7 % — ABNORMAL HIGH (ref 4.6–6.5)

## 2023-02-08 MED ORDER — CANDESARTAN CILEXETIL 32 MG PO TABS: 32.0000 mg | ORAL_TABLET | Freq: Every day | ORAL | 3 refills | Status: DC

## 2023-02-08 NOTE — Patient Instructions (Signed)
Visit Information  Thank you for taking time to visit with me today. Please don't hesitate to contact me if I can be of assistance to you.   Following are the goals we discussed today:   Goals Addressed             This Visit's Progress    Care Coordination Activities-No follow up required       Care Coordination Interventions: Discussed [facility] services and support. Assessed SDOH. Advised to discuss with primary care physician if services needed in the future.          If you are experiencing a Mental Health or Behavioral Health Crisis or need someone to talk to, please call the Suicide and Crisis Lifeline: 988   Patient verbalizes understanding of instructions and care plan provided today and agrees to view in MyChart. Active MyChart status and patient understanding of how to access instructions and care plan via MyChart confirmed with patient.     The patient has been provided with contact information for the care management team and has been advised to call with any health related questions or concerns.   Dequarius Jeffries J Grete Bosko, RN, MSN [facility] Care Management Care Management Coordinator Direct Line [phone]     

## 2023-02-08 NOTE — Patient Outreach (Signed)
  Care Coordination   In Person Provider Office Visit Note   02/08/2023 Name: Paula Kelly MRN: [id number] DOB: Jan 15, 1956  Paula Kelly is a 67 y.o. year old female who sees Mliss Sax, MD for primary care. I engaged with Karen Kitchens in the providers office today.  What matters to the patients health and wellness today?  none    Goals Addressed             This Visit's Progress    Care Coordination Activities-No follow up required       Care Coordination Interventions: Discussed [facility] services and support. Assessed SDOH. Advised to discuss with primary care physician if services needed in the future.         SDOH assessments and interventions completed:  Yes  SDOH Interventions Today    Flowsheet Row Most Recent Value  SDOH Interventions   Housing Interventions Intervention Not Indicated        Care Coordination Interventions:  Yes, provided   Follow up plan: No further intervention required.   Encounter Outcome:  Pt. Visit Completed   Bary Leriche, RN, MSN [facility] Care Management Care Management Coordinator Direct Line [phone]

## 2023-02-08 NOTE — Progress Notes (Signed)
Established Patient Office Visit   Subjective:  Patient ID: Paula Kelly, female    DOB: 1956-06-16  Age: 67 y.o. MRN: [id number]  Chief Complaint  Patient presents with   Follow-up    Follow up BP. Pt states she does not have any complaints.     HPI Encounter Diagnoses  Name Primary?   Diminished pulses in lower extremity Yes   Essential hypertension    Controlled type 2 diabetes mellitus without complication, without long-term current use of insulin (HCC)    For follow-up of the above.  In a much better state mentally.  She quit smoking on 5/24 while vacationing in [place].  She is using nicotine lozenges for urges.  She has been out of her Atacand although we have requested refills.  She is planning on going ahead with the DEXA scan and mammogram.  Status post follow-up with urology.  She is on medication for bladder spasms and it has helped a great deal.  She is scheduled for follow-up for a growth on her bladder in the near future.   Review of Systems  Constitutional: Negative.   HENT: Negative.    Eyes:  Negative for blurred vision, discharge and redness.  Respiratory: Negative.    Cardiovascular: Negative.   Gastrointestinal:  Negative for abdominal pain.  Genitourinary: Negative.   Musculoskeletal: Negative.  Negative for myalgias.  Skin:  Negative for rash.  Neurological:  Negative for tingling, loss of consciousness and weakness.  Endo/Heme/Allergies:  Negative for polydipsia.     Current Outpatient Medications:    amphetamine-dextroamphetamine (ADDERALL XR) 30 MG 24 hr capsule, Will start this prescription once the 20 mg prescription has run out., Disp: , Rfl:    Ascorbic Acid (VITAMIN C) 100 MG tablet, Take 100 mg by mouth daily., Disp: , Rfl:    aspirin EC 81 MG tablet, Take 1 tablet (81 mg total) by mouth daily., Disp: 365 tablet, Rfl: 0   atorvastatin (LIPITOR) 80 MG tablet, TAKE 1 TABLET(80 MG) BY MOUTH DAILY, Disp: 90 tablet, Rfl: 3   buPROPion  (WELLBUTRIN XL) 150 MG 24 hr tablet, Take 1 tablet by mouth daily., Disp: , Rfl:    Cholecalciferol (VITAMIN D3) 50 MCG (2000 UT) TABS, Take 1 tablet by mouth daily., Disp: 90 tablet, Rfl: 2   Coenzyme Q10 200 MG capsule, , Disp: , Rfl:    Cyanocobalamin (VITAMIN B 12 PO), Take by mouth., Disp: , Rfl:    diclofenac Sodium (VOLTAREN) 1 % GEL, Apply a small grape sized dollop to the left knee every 6 hours as needed., Disp: 150 g, Rfl: 1   DULoxetine (CYMBALTA) 60 MG capsule, Take 2 capsules (120 mg total) by mouth daily., Disp: 180 capsule, Rfl: 1   ezetimibe (ZETIA) 10 MG tablet, TAKE 1 TABLET(10 MG) BY MOUTH DAILY, Disp: 90 tablet, Rfl: 3   fish oil-omega-3 fatty acids 1000 MG capsule, Take by mouth., Disp: , Rfl:    folic acid (FOLVITE) 1 MG tablet, Take 1 mg by mouth daily., Disp: , Rfl:    metFORMIN (GLUCOPHAGE) 500 MG tablet, TAKE 1 TABLET(500 MG) BY MOUTH TWICE DAILY, Disp: 180 tablet, Rfl: 2   Multiple Vitamin (MULTI-VITAMINS) TABS, Take 1 tablet by mouth daily., Disp: , Rfl:    QUEtiapine (SEROQUEL) 25 MG tablet, Take 25 mg by mouth at bedtime., Disp: , Rfl:    vitamin E 400 UNIT capsule, Take 1 capsule by mouth daily., Disp: , Rfl:    acetaminophen (TYLENOL) 500 MG tablet,  Take by mouth. (Patient not taking: Reported on 02/08/2023), Disp: , Rfl:    candesartan (ATACAND) 32 MG tablet, Take 1 tablet (32 mg total) by mouth daily., Disp: 100 tablet, Rfl: 3   glucose blood (FREESTYLE LITE) test strip, Use to test 1-2 times daily. (Patient not taking: Reported on 02/08/2023), Disp: 100 each, Rfl: 5   meloxicam (MOBIC) 7.5 MG tablet, Take 1 tablet (7.5 mg total) by mouth daily as needed for pain. (Patient not taking: Reported on 02/08/2023), Disp: 30 tablet, Rfl: 3   mometasone (NASONEX) 50 MCG/ACT nasal spray, Place 2 sprays into the nose daily. (Patient not taking: Reported on 02/08/2023), Disp: 1 each, Rfl: 12   Objective:     BP (!) 168/84   Pulse (!) 121   Temp (!) 97.4 F (36.3 C)   Ht  5\' 3"  (1.6 m)   Wt 212 lb 12.8 oz (96.5 kg)   SpO2 97%   BMI 37.70 kg/m  BP Readings from Last 3 Encounters:  02/08/23 (!) 168/84  01/17/23 (!) 146/68  10/31/22 128/66   Wt Readings from Last 3 Encounters:  02/08/23 212 lb 12.8 oz (96.5 kg)  01/17/23 201 lb (91.2 kg)  10/31/22 201 lb 3.2 oz (91.3 kg)      Physical Exam Constitutional:      General: She is not in acute distress.    Appearance: Normal appearance. She is not ill-appearing, toxic-appearing or diaphoretic.  HENT:     Head: Normocephalic and atraumatic.     Right Ear: External ear normal.     Left Ear: External ear normal.  Eyes:     General: No scleral icterus.       Right eye: No discharge.        Left eye: No discharge.     Extraocular Movements: Extraocular movements intact.     Conjunctiva/sclera: Conjunctivae normal.  Cardiovascular:     Rate and Rhythm: Normal rate and regular rhythm.     Pulses:          Dorsalis pedis pulses are 0 on the right side and 0 on the left side.       Posterior tibial pulses are 0 on the right side and 0 on the left side.     Comments: Capillary refill less than 2 second Pulmonary:     Effort: Pulmonary effort is normal. No respiratory distress.     Breath sounds: Normal breath sounds.  Skin:    General: Skin is warm and dry.  Neurological:     Mental Status: She is alert and oriented to person, place, and time.  Psychiatric:        Mood and Affect: Mood normal.        Behavior: Behavior normal.      No results found for any visits on 02/08/23.    The ASCVD Risk score (Arnett DK, et al., 2019) failed to calculate for the following reasons:   The valid total cholesterol range is 130 to 320 mg/dL    Assessment & Plan:   Diminished pulses in lower extremity  Essential hypertension -     Candesartan Cilexetil; Take 1 tablet (32 mg total) by mouth daily.  Dispense: 100 tablet; Refill: 3 -     Basic metabolic panel  Controlled type 2 diabetes mellitus without  complication, without long-term current use of insulin (HCC) -     Candesartan Cilexetil; Take 1 tablet (32 mg total) by mouth daily.  Dispense: 100 tablet; Refill: 3 -  Basic metabolic panel -     Hemoglobin A1c    Return in about 4 months (around 06/10/2023).  Restart Atacand.  Rechecking A1c.  Will go for DEXA, mammogram and lower extremity ABIs.  Mliss Sax, MD

## 2023-02-19 ENCOUNTER — Other Ambulatory Visit: Payer: Self-pay

## 2023-02-19 DIAGNOSIS — E119 Type 2 diabetes mellitus without complications: Secondary | ICD-10-CM

## 2023-02-19 DIAGNOSIS — E7800 Pure hypercholesterolemia, unspecified: Secondary | ICD-10-CM

## 2023-02-19 MED ORDER — ATORVASTATIN CALCIUM 80 MG PO TABS
ORAL_TABLET | ORAL | 3 refills | Status: DC
Start: 2023-02-19 — End: 2023-12-16

## 2023-02-20 ENCOUNTER — Telehealth: Payer: Self-pay | Admitting: Family Medicine

## 2023-02-20 NOTE — Telephone Encounter (Signed)
Pt would like for you to give her a call . Pt believe  she have C-DIFF constant diarrhea constantly

## 2023-02-25 ENCOUNTER — Ambulatory Visit: Payer: Medicare Other | Admitting: Internal Medicine

## 2023-02-25 ENCOUNTER — Ambulatory Visit: Payer: Medicare Other | Admitting: Family Medicine

## 2023-02-25 ENCOUNTER — Telehealth: Payer: Self-pay | Admitting: Family Medicine

## 2023-02-25 NOTE — Telephone Encounter (Signed)
Pt was late for her appt with Morrie Sheldon on 02/25/23, I sent a letter.

## 2023-02-26 ENCOUNTER — Encounter: Payer: Self-pay | Admitting: Internal Medicine

## 2023-02-26 ENCOUNTER — Ambulatory Visit (INDEPENDENT_AMBULATORY_CARE_PROVIDER_SITE_OTHER): Payer: Medicare Other | Admitting: Internal Medicine

## 2023-02-26 VITALS — BP 138/70 | HR 90 | Temp 98.1°F | Ht 63.0 in | Wt 207.8 lb

## 2023-02-26 DIAGNOSIS — R197 Diarrhea, unspecified: Secondary | ICD-10-CM | POA: Diagnosis not present

## 2023-02-26 DIAGNOSIS — E7800 Pure hypercholesterolemia, unspecified: Secondary | ICD-10-CM

## 2023-02-26 LAB — CBC WITH DIFFERENTIAL/PLATELET
Basophils Absolute: 0.1 10*3/uL (ref 0.0–0.1)
Basophils Relative: 1.1 % (ref 0.0–3.0)
Eosinophils Absolute: 2 10*3/uL — ABNORMAL HIGH (ref 0.0–0.7)
Eosinophils Relative: 20.5 % — ABNORMAL HIGH (ref 0.0–5.0)
HCT: 38.7 % (ref 36.0–46.0)
Hemoglobin: 12.6 g/dL (ref 12.0–15.0)
Lymphocytes Relative: 23.5 % (ref 12.0–46.0)
Lymphs Abs: 2.3 10*3/uL (ref 0.7–4.0)
MCHC: 32.4 g/dL (ref 30.0–36.0)
MCV: 89.9 fl (ref 78.0–100.0)
Monocytes Absolute: 0.7 10*3/uL (ref 0.1–1.0)
Monocytes Relative: 6.8 % (ref 3.0–12.0)
Neutro Abs: 4.8 10*3/uL (ref 1.4–7.7)
Neutrophils Relative %: 48.1 % (ref 43.0–77.0)
Platelets: 497 10*3/uL — ABNORMAL HIGH (ref 150.0–400.0)
RBC: 4.31 Mil/uL (ref 3.87–5.11)
RDW: 14.3 % (ref 11.5–15.5)
WBC: 10 10*3/uL (ref 4.0–10.5)

## 2023-02-26 LAB — COMPREHENSIVE METABOLIC PANEL
ALT: 14 U/L (ref 0–35)
AST: 14 U/L (ref 0–37)
Albumin: 4 g/dL (ref 3.5–5.2)
Alkaline Phosphatase: 82 U/L (ref 39–117)
BUN: 14 mg/dL (ref 6–23)
CO2: 27 mEq/L (ref 19–32)
Calcium: 9.1 mg/dL (ref 8.4–10.5)
Chloride: 105 mEq/L (ref 96–112)
Creatinine, Ser: 0.73 mg/dL (ref 0.40–1.20)
GFR: 85.75 mL/min (ref >60.00)
Glucose, Bld: 86 mg/dL (ref 70–99)
Potassium: 4.4 mEq/L (ref 3.5–5.1)
Sodium: 138 mEq/L (ref 135–145)
Total Bilirubin: 0.3 mg/dL (ref 0.2–1.2)
Total Protein: 5.9 g/dL — ABNORMAL LOW (ref 6.0–8.3)

## 2023-02-26 MED ORDER — EZETIMIBE 10 MG PO TABS
ORAL_TABLET | ORAL | 3 refills | Status: DC
Start: 2023-02-26 — End: 2023-12-19

## 2023-02-26 NOTE — Patient Instructions (Signed)
I will be in touch with lab results  Return stool sample for testing

## 2023-02-26 NOTE — Progress Notes (Signed)
[facility] [address] [place] [address] Dept: [phone] Dept Fax: [phone]  Acute Care Office Visit  Subjective:   Paula Kelly 08-22-1955 02/26/2023  Chief Complaint  Patient presents with   Diarrhea    June 26 had a procedure believes it may have caused diarrhea Possible C-Diff and loose stools foul smelling stool    HPI: Discussed the use of AI scribe software for clinical note transcription with the patient, who gave verbal consent to proceed.  History of Present Illness   The patient, with a history of diverticulosis, presents with diarrhea since June 26th, following a urological procedure and a single dose of Ciprofloxacin. They describe the stool as 'horrible smelling,' similar to C. diff infections their mother had, and initially black, but now brown. The frequency of bowel movements varies, with up to ten times on the worst days, and usually five to six times, including nocturnal bowel movements. They also report left-sided abdominal pain, which was initially very tender to touch, but is now just sore. They deny blood in the stool, fever, nausea, and vomiting. They have tried over-the-counter anti-diarrheal medication twice, with little improvement.     Patient also requests her Zetia Rx to be sent to [facility] pharmacy. She is having trouble getting it with [facility].   The following portions of the patient's history were reviewed and updated as appropriate: past medical history, past surgical history, family history, social history, allergies, medications, and problem list.   Patient Active Problem List   Diagnosis Date Noted   Urinary incontinence 10/31/2022   Postmenopausal estrogen deficiency 10/31/2022   Diminished pulses in lower extremity 11/29/2021   Closed fracture of lateral portion of left tibial plateau 11/13/2021   Hamstring tendinitis at origin 08/30/2021   Depression, recurrent (HCC)  08/30/2021   Malaise 08/30/2021   Other fatigue 08/30/2021   Post-viral cough syndrome 08/30/2021   Stress due to illness of family member 08/30/2021   Capsulitis of right shoulder 07/21/2020   Xerostomia 06/09/2020   Herpes zoster without complication 06/09/2020   Aphthous ulcer 06/09/2020   Dislocation of jaw 06/09/2020   Urge incontinence 05/02/2020   Chronic obstructive pulmonary disease (HCC) 11/23/2019   Strain of left hamstring 08/05/2019   Capsulitis of left shoulder 07/10/2019   Plantar fasciitis of left foot 04/28/2019   Excessive daytime sleepiness 04/28/2019   Snores 04/28/2019   Vitamin D deficiency 04/28/2019   Pain of left calf 11/18/2017   Attention deficit hyperactivity disorder (ADHD), predominantly hyperactive type 07/25/2017   Essential hypertension 07/25/2017   Controlled type 2 diabetes mellitus without complication, without long-term current use of insulin (HCC) 07/25/2017   Elevated cholesterol 07/25/2017   Encounter for hepatitis C screening test for low risk patient 07/25/2017   Tobacco use 07/25/2017   Health maintenance alteration 07/25/2017   Breast cancer screening by mammogram 07/25/2017   Need for 23-polyvalent pneumococcal polysaccharide vaccine 07/25/2017   Arthralgia 07/25/2017   Past Medical History:  Diagnosis Date   Allergy    seasonal   Arthritis    Cataract    Depression    Diabetes mellitus without complication (HCC)    H/O fibromyalgia    had for over 10 years   Hyperlipidemia    Hypertension    Post-operative nausea and vomiting    Past Surgical History:  Procedure Laterality Date   APPENDECTOMY     COLONOSCOPY     kidney stone removal  2013   used a  basket extraction   Family History  Problem Relation Age of Onset   Diabetes Mother    Heart disease Father    Emphysema Father    CVA Sister    AAA (abdominal aortic aneurysm) Brother    Outpatient Medications Prior to Visit  Medication Sig Dispense Refill    amphetamine-dextroamphetamine (ADDERALL XR) 30 MG 24 hr capsule Will start this prescription once the 20 mg prescription has run out.     Ascorbic Acid (VITAMIN C) 100 MG tablet Take 100 mg by mouth daily.     aspirin EC 81 MG tablet Take 1 tablet (81 mg total) by mouth daily. 365 tablet 0   buPROPion (WELLBUTRIN XL) 150 MG 24 hr tablet Take 1 tablet by mouth daily.     Cholecalciferol (VITAMIN D3) 50 MCG (2000 UT) TABS Take 1 tablet by mouth daily. 90 tablet 2   Coenzyme Q10 200 MG capsule      Cyanocobalamin (VITAMIN B 12 PO) Take by mouth.     diclofenac Sodium (VOLTAREN) 1 % GEL Apply a small grape sized dollop to the left knee every 6 hours as needed. 150 g 1   DULoxetine (CYMBALTA) 60 MG capsule Take 2 capsules (120 mg total) by mouth daily. 180 capsule 1   fish oil-omega-3 fatty acids 1000 MG capsule Take by mouth.     folic acid (FOLVITE) 1 MG tablet Take 1 mg by mouth daily.     metFORMIN (GLUCOPHAGE) 500 MG tablet TAKE 1 TABLET(500 MG) BY MOUTH TWICE DAILY 180 tablet 2   Multiple Vitamin (MULTI-VITAMINS) TABS Take 1 tablet by mouth daily.     MYRBETRIQ 50 MG TB24 tablet Take 50 mg by mouth daily.     QUEtiapine (SEROQUEL) 25 MG tablet Take 25 mg by mouth at bedtime.     vitamin E 400 UNIT capsule Take 1 capsule by mouth daily.     atorvastatin (LIPITOR) 80 MG tablet TAKE 1 TABLET(80 MG) BY MOUTH DAILY (Patient not taking: Reported on 02/26/2023) 90 tablet 3   candesartan (ATACAND) 32 MG tablet Take 1 tablet (32 mg total) by mouth daily. (Patient not taking: Reported on 02/26/2023) 100 tablet 3   glucose blood (FREESTYLE LITE) test strip Use to test 1-2 times daily. (Patient not taking: Reported on 02/08/2023) 100 each 5   ezetimibe (ZETIA) 10 MG tablet TAKE 1 TABLET(10 MG) BY MOUTH DAILY (Patient not taking: Reported on 02/26/2023) 90 tablet 3   No facility-administered medications prior to visit.   Allergies  Allergen Reactions   Metronidazole Rash   ROS: A complete ROS was performed  with pertinent positives/negatives noted in the HPI. The remainder of the ROS are negative.    Objective:   Today's Vitals   02/26/23 1035  BP: 138/70  Pulse: 90  Temp: 98.1 F (36.7 C)  TempSrc: Temporal  SpO2: 98%  Weight: 207 lb 12.8 oz (94.3 kg)  Height: 5\' 3"  (1.6 m)    GENERAL: Well-appearing, in NAD. Well nourished.  SKIN: Pink, warm and dry. No rash, lesion, ulceration, or ecchymoses.  HEENT:    HEAD: Normocephalic, non-traumatic.  EYES: Conjunctive pink without exudate. THROAT: Uvula midline. Oropharynx clear. Mucus membranes pink and moist. NECK: Trachea midline. Full ROM w/o pain or tenderness. No lymphadenopathy.  RESPIRATORY: Chest wall symmetrical. Respirations even and non-labored. Breath sounds clear to auscultation bilaterally.  CARDIAC: S1, S2 present, regular rate and rhythm. Peripheral pulses 2+ bilaterally.  Abdomen: Soft, minimal tenderness to LLQ. Bowel sounds positive, normal  pitch and frequency. No hepatosplenomegaly. No rebound or guarding. MSK: Muscle tone and strength appropriate for age.  EXTREMITIES: Without clubbing, cyanosis, or edema.  NEUROLOGIC: No motor or sensory deficits. Steady, even gait.  PSYCH/MENTAL STATUS: Alert, oriented x 3. Cooperative, appropriate mood and affect.    No results found for any visits on 02/26/23.    Assessment & Plan:  Assessment and Plan    Diarrhea: Persistent diarrhea since 02/13/2023, following a urology procedure and a single dose of Ciprofloxacin. Patient reports foul-smelling stool, frequent bowel movements, and left-sided abdominal pain. No fever, nausea, or vomiting. Initial black stool, now brown. Patient suspects C. diff infection. -Collect stool sample for C. diff and other GI pathogen testing. -Order blood work to check kidney function, liver function, and white blood cell count.  Hyperlipidemia: Resent Zetia 10mg  Po daily to [facility].     Meds ordered this encounter  Medications    ezetimibe (ZETIA) 10 MG tablet    Sig: TAKE 1 TABLET(10 MG) BY MOUTH DAILY    Dispense:  90 tablet    Refill:  3    Order Specific Question:   Supervising Provider    Answer:   Garnette Gunner [[id number]]   Lab Orders         Clostridium Difficile by PCR         GI pathogen panel by PCR, stool         CBC with Differential/Platelet         Comp Met (CMET)     No images are attached to the encounter or orders placed in the encounter.  Return if symptoms worsen or fail to improve.   Of note, portions of this note may have been created with voice recognition software [employer]ic[employer], medical). While this note has been edited for accuracy, occasional wrong-word or 'sound-a-like' substitutions may have occurred due to the inherent limitations of voice recognition software.   Salvatore Decent, FNP

## 2023-02-27 ENCOUNTER — Telehealth: Payer: Self-pay | Admitting: Family Medicine

## 2023-02-27 NOTE — Telephone Encounter (Signed)
Pt called to say that her candesartan (ATACAND) 32 MG tablet [[id number]] is not at the pharmacy. She stated that she had all her meds changed to [facility] or [facility] She was upset because they were not changed. I saw 1 that was.  Please change this in her chart and resend her med above to [facility]. Will you call her when it is sent?

## 2023-02-28 ENCOUNTER — Ambulatory Visit: Payer: Medicare Other | Attending: Family Medicine

## 2023-02-28 ENCOUNTER — Other Ambulatory Visit: Payer: Medicare Other

## 2023-02-28 DIAGNOSIS — R197 Diarrhea, unspecified: Secondary | ICD-10-CM

## 2023-03-02 LAB — CLOSTRIDIUM DIFFICILE BY PCR: Toxigenic C. Difficile by PCR: NEGATIVE

## 2023-03-11 ENCOUNTER — Other Ambulatory Visit: Payer: Self-pay | Admitting: Emergency Medicine

## 2023-03-11 DIAGNOSIS — Z87891 Personal history of nicotine dependence: Secondary | ICD-10-CM

## 2023-03-11 DIAGNOSIS — Z122 Encounter for screening for malignant neoplasm of respiratory organs: Secondary | ICD-10-CM

## 2023-04-01 ENCOUNTER — Ambulatory Visit
Admission: RE | Admit: 2023-04-01 | Discharge: 2023-04-01 | Disposition: A | Discharge status: Home / Self Care | Payer: Medicare Other | Source: Ambulatory Visit | Attending: Acute Care | Admitting: Acute Care

## 2023-04-01 DIAGNOSIS — Z87891 Personal history of nicotine dependence: Secondary | ICD-10-CM

## 2023-04-01 DIAGNOSIS — Z122 Encounter for screening for malignant neoplasm of respiratory organs: Secondary | ICD-10-CM

## 2023-04-04 ENCOUNTER — Encounter (INDEPENDENT_AMBULATORY_CARE_PROVIDER_SITE_OTHER): Payer: Self-pay

## 2023-04-07 ENCOUNTER — Other Ambulatory Visit: Payer: Self-pay | Admitting: Acute Care

## 2023-04-07 DIAGNOSIS — Z87891 Personal history of nicotine dependence: Secondary | ICD-10-CM

## 2023-04-07 DIAGNOSIS — Z122 Encounter for screening for malignant neoplasm of respiratory organs: Secondary | ICD-10-CM

## 2023-05-03 ENCOUNTER — Ambulatory Visit: Payer: Medicare Other | Admitting: Family Medicine

## 2023-05-06 ENCOUNTER — Telehealth: Payer: Self-pay | Admitting: Family Medicine

## 2023-05-06 ENCOUNTER — Ambulatory Visit: Payer: Medicare Other | Admitting: Family Medicine

## 2023-05-06 NOTE — Telephone Encounter (Signed)
1st missed visit

## 2023-05-06 NOTE — Telephone Encounter (Signed)
05/06/23 - No show letter sent.

## 2023-05-06 NOTE — Telephone Encounter (Signed)
Pt walked in at 11:00am thinking her appt was at 11:00am. She left without being seen

## 2023-05-09 ENCOUNTER — Ambulatory Visit: Payer: Medicare Other | Admitting: Family Medicine

## 2023-05-09 VITALS — BP 152/82 | HR 111 | Temp 97.7°F | Ht 63.0 in | Wt 210.0 lb

## 2023-05-09 DIAGNOSIS — Z4802 Encounter for removal of sutures: Secondary | ICD-10-CM | POA: Diagnosis not present

## 2023-05-09 DIAGNOSIS — Z23 Encounter for immunization: Secondary | ICD-10-CM

## 2023-05-09 NOTE — Progress Notes (Signed)
Established Patient Office Visit   Subjective:  Patient ID: Paula Kelly, female    DOB: [birth year]  Age: 67 y.o. MRN: [id number]  Chief Complaint  Patient presents with   Medical Management of Chronic Issues    3 month follow up.    Toe Injury    Pt states she broke her left 4th toe 6 weeks ago and still having edema and redness.    Fall    Pt has a fall 2 weeks ago and fell on the her stanley cup and the straw punctured her jaw.     Fall Pertinent negatives include no abdominal pain, loss of consciousness or tingling.   Encounter Diagnoses  Name Primary?   Need for immunization against influenza Yes   Visit for suture removal    Status post laceration to chin 2 weeks ago that was repaired by another healthcare system.  Wound is healed well.  There has been no discharge or fevers chills.   Review of Systems  Constitutional: Negative.   HENT: Negative.    Eyes:  Negative for blurred vision, discharge and redness.  Respiratory: Negative.    Cardiovascular: Negative.   Gastrointestinal:  Negative for abdominal pain.  Genitourinary: Negative.   Musculoskeletal: Negative.  Negative for myalgias.  Skin:  Negative for rash.  Neurological:  Negative for tingling, loss of consciousness and weakness.  Endo/Heme/Allergies:  Negative for polydipsia.     Current Outpatient Medications:    amphetamine-dextroamphetamine (ADDERALL XR) 30 MG 24 hr capsule, Will start this prescription once the 20 mg prescription has run out., Disp: , Rfl:    Ascorbic Acid (VITAMIN C) 100 MG tablet, Take 100 mg by mouth daily., Disp: , Rfl:    aspirin EC 81 MG tablet, Take 1 tablet (81 mg total) by mouth daily., Disp: 365 tablet, Rfl: 0   atorvastatin (LIPITOR) 80 MG tablet, TAKE 1 TABLET(80 MG) BY MOUTH DAILY, Disp: 90 tablet, Rfl: 3   buPROPion (WELLBUTRIN XL) 150 MG 24 hr tablet, Take 1 tablet by mouth daily., Disp: , Rfl:    candesartan (ATACAND) 32 MG tablet, Take 1 tablet (32 mg total) by  mouth daily., Disp: 100 tablet, Rfl: 3   Cholecalciferol (VITAMIN D3) 50 MCG (2000 UT) TABS, Take 1 tablet by mouth daily., Disp: 90 tablet, Rfl: 2   Coenzyme Q10 200 MG capsule, , Disp: , Rfl:    Cyanocobalamin (VITAMIN B 12 PO), Take by mouth., Disp: , Rfl:    diclofenac Sodium (VOLTAREN) 1 % GEL, Apply a small grape sized dollop to the left knee every 6 hours as needed., Disp: 150 g, Rfl: 1   DULoxetine (CYMBALTA) 60 MG capsule, Take 2 capsules (120 mg total) by mouth daily., Disp: 180 capsule, Rfl: 1   ezetimibe (ZETIA) 10 MG tablet, TAKE 1 TABLET(10 MG) BY MOUTH DAILY, Disp: 90 tablet, Rfl: 3   fish oil-omega-3 fatty acids 1000 MG capsule, Take by mouth., Disp: , Rfl:    folic acid (FOLVITE) 1 MG tablet, Take 1 mg by mouth daily., Disp: , Rfl:    glucose blood (FREESTYLE LITE) test strip, Use to test 1-2 times daily., Disp: 100 each, Rfl: 5   metFORMIN (GLUCOPHAGE) 500 MG tablet, TAKE 1 TABLET(500 MG) BY MOUTH TWICE DAILY, Disp: 180 tablet, Rfl: 2   Multiple Vitamin (MULTI-VITAMINS) TABS, Take 1 tablet by mouth daily., Disp: , Rfl:    MYRBETRIQ 50 MG TB24 tablet, Take 50 mg by mouth daily., Disp: , Rfl:  QUEtiapine (SEROQUEL) 25 MG tablet, Take 25 mg by mouth at bedtime., Disp: , Rfl:    vitamin E 400 UNIT capsule, Take 1 capsule by mouth daily., Disp: , Rfl:    Objective:     BP (!) 152/82   Pulse (!) 111   Temp 97.7 F (36.5 C)   Ht 5\' 3"  (1.6 m)   Wt 210 lb (95.3 kg)   SpO2 97%   BMI 37.20 kg/m    Physical Exam Constitutional:      General: She is not in acute distress.    Appearance: Normal appearance. She is not ill-appearing, toxic-appearing or diaphoretic.  HENT:     Head: Normocephalic and atraumatic.     Right Ear: External ear normal.     Left Ear: External ear normal.  Eyes:     General: No scleral icterus.       Right eye: No discharge.        Left eye: No discharge.     Extraocular Movements: Extraocular movements intact.     Conjunctiva/sclera:  Conjunctivae normal.  Pulmonary:     Effort: Pulmonary effort is normal. No respiratory distress.  Skin:    General: Skin is warm and dry.       Neurological:     Mental Status: She is alert and oriented to person, place, and time.  Psychiatric:        Mood and Affect: Mood normal.        Behavior: Behavior normal.      No results found for any visits on 05/09/23.    The ASCVD Risk score (Arnett DK, et al., 2019) failed to calculate for the following reasons:   The valid total cholesterol range is 130 to 320 mg/dL    Assessment & Plan:   Need for immunization against influenza -     Flu vaccine HIGH DOSE PF  Visit for suture removal    No follow-ups on file.    Mliss Sax, MD

## 2023-05-29 ENCOUNTER — Encounter: Payer: Self-pay | Admitting: Family Medicine

## 2023-06-19 ENCOUNTER — Other Ambulatory Visit: Payer: Self-pay

## 2023-06-19 DIAGNOSIS — I10 Essential (primary) hypertension: Secondary | ICD-10-CM

## 2023-06-19 DIAGNOSIS — E119 Type 2 diabetes mellitus without complications: Secondary | ICD-10-CM

## 2023-06-19 MED ORDER — CANDESARTAN CILEXETIL 32 MG PO TABS
32.0000 mg | ORAL_TABLET | Freq: Every day | ORAL | 3 refills | Status: DC
Start: 2023-06-19 — End: 2024-05-25

## 2023-06-25 HISTORY — PX: RETINAL DETACHMENT SURGERY: SHX105

## 2023-09-30 ENCOUNTER — Other Ambulatory Visit: Payer: Self-pay | Admitting: Family Medicine

## 2023-09-30 DIAGNOSIS — E119 Type 2 diabetes mellitus without complications: Secondary | ICD-10-CM

## 2023-10-21 ENCOUNTER — Ambulatory Visit: Payer: Medicare Other | Admitting: Family Medicine

## 2023-10-25 ENCOUNTER — Telehealth: Payer: Self-pay | Admitting: Family Medicine

## 2023-10-25 ENCOUNTER — Encounter: Payer: Self-pay | Admitting: Family Medicine

## 2023-10-25 NOTE — Telephone Encounter (Signed)
 02/25/2023 late arrival (appt @ 9:00a, arrival 10:53a) 05/06/2023 late arrival (appt @ 10:00a, arrival 11:00a) 10/21/2023 late arrival (appt @ 9:20a, arrival 10:15a)  Final warning letter sent via mail and mychart

## 2023-10-28 ENCOUNTER — Ambulatory Visit (INDEPENDENT_AMBULATORY_CARE_PROVIDER_SITE_OTHER): Payer: Medicare Other

## 2023-10-28 DIAGNOSIS — Z0000 Encounter for general adult medical examination without abnormal findings: Secondary | ICD-10-CM | POA: Diagnosis not present

## 2023-10-28 NOTE — Patient Instructions (Signed)
 Paula Kelly , Thank you for taking time to come for your Medicare Wellness Visit. I appreciate your ongoing commitment to your health goals. Please review the following plan we discussed and let me know if I can assist you in the future.   Referrals/Orders/Follow-Ups/Clinician Recommendations: none  This is a list of the screening recommended for you and due dates:  Health Maintenance  Topic Date Due   Colon Cancer Screening  08/12/2022   Hemoglobin A1C  08/10/2023   Yearly kidney health urinalysis for diabetes  10/31/2023   Complete foot exam   10/31/2023   Mammogram  11/15/2023   Eye exam for diabetics  11/26/2023   Yearly kidney function blood test for diabetes  02/26/2024   Screening for Lung Cancer  03/31/2024   Medicare Annual Wellness Visit  10/27/2024   DTaP/Tdap/Td vaccine (2 - Td or Tdap) 10/18/2025   Pneumonia Vaccine  Completed   Flu Shot  Completed   DEXA scan (bone density measurement)  Completed   Hepatitis C Screening  Completed   Zoster (Shingles) Vaccine  Completed   HPV Vaccine  Aged Out   COVID-19 Vaccine  Discontinued    Advanced directives: (Copy Requested) Please bring a copy of your health care power of attorney and living will to the office to be added to your chart at your convenience. You can mail to [facility] [address] [place], [place] [address] or email to [email or web address]  Next Medicare Annual Wellness Visit scheduled for next year: Yes  insert Preventive Care attachment Insert FALL PREVENTION attachment if needed

## 2023-10-28 NOTE — Progress Notes (Signed)
 Subjective:   Paula Kelly is a 68 y.o. who presents for a Medicare Wellness preventive visit.  Visit Complete: Virtual I connected with  [name] on 10/28/23 by a audio enabled telemedicine application and verified that I am speaking with the correct person using two identifiers.  Patient Location: Home  Provider Location: Office/Clinic  I discussed the limitations of evaluation and management by telemedicine. The patient expressed understanding and agreed to proceed.  Vital Signs: Because this visit was a virtual/telehealth visit, some criteria may be missing or patient reported. Any vitals not documented were not able to be obtained and vitals that have been documented are patient reported.  VideoError- Librarian, academic were attempted between this provider and patient, however failed, due to patient having technical difficulties OR patient did not have access to video capability.  We continued and completed visit with audio only.   AWV Questionnaire: No: Patient Medicare AWV questionnaire was not completed prior to this visit.  Cardiac Risk Factors include: advanced age (>82men, >36 women);diabetes mellitus;hypertension     Objective:    Today's Vitals   10/28/23 1337  PainSc: 7    There is no height or weight on file to calculate BMI.     10/28/2023    1:52 PM 10/26/2022    1:04 PM 11/19/2019    1:14 PM  Advanced Directives  Does Patient Have a Medical Advance Directive? Yes Yes Yes  Type of Estate agent of [place];Living will Healthcare Power of [place];Living will Healthcare Power of [place];Living will  Does patient want to make changes to medical advance directive?   No - Patient declined  Copy of Healthcare Power of Attorney in Chart? No - copy requested No - copy requested No - copy requested    Current Medications (verified) Outpatient Encounter Medications as of 10/28/2023  Medication Sig    amphetamine-dextroamphetamine (ADDERALL XR) 30 MG 24 hr capsule Will start this prescription once the 20 mg prescription has run out.   Ascorbic Acid (VITAMIN C) 100 MG tablet Take 100 mg by mouth daily.   atorvastatin (LIPITOR) 80 MG tablet TAKE 1 TABLET(80 MG) BY MOUTH DAILY   buPROPion (WELLBUTRIN XL) 150 MG 24 hr tablet Take 1 tablet by mouth daily.   candesartan (ATACAND) 32 MG tablet Take 1 tablet (32 mg total) by mouth daily.   Cholecalciferol (VITAMIN D3) 50 MCG (2000 UT) TABS Take 1 tablet by mouth daily.   Coenzyme Q10 200 MG capsule    Cyanocobalamin (VITAMIN B 12 PO) Take by mouth.   diclofenac Sodium (VOLTAREN) 1 % GEL Apply a small grape sized dollop to the left knee every 6 hours as needed.   DULoxetine (CYMBALTA) 60 MG capsule Take 2 capsules (120 mg total) by mouth daily.   ezetimibe (ZETIA) 10 MG tablet TAKE 1 TABLET(10 MG) BY MOUTH DAILY   fish oil-omega-3 fatty acids 1000 MG capsule Take by mouth.   folic acid (FOLVITE) 1 MG tablet Take 1 mg by mouth daily.   glucose blood (FREESTYLE LITE) test strip Use to test 1-2 times daily.   metFORMIN (GLUCOPHAGE) 500 MG tablet TAKE 1 TABLET(500 MG) BY MOUTH TWICE DAILY   Multiple Vitamin (MULTI-VITAMINS) TABS Take 1 tablet by mouth daily.   QUEtiapine (SEROQUEL) 25 MG tablet Take 25 mg by mouth at bedtime. Takes 12.5 mg   vitamin E 400 UNIT capsule Take 1 capsule by mouth daily.   aspirin EC 81 MG tablet Take 1 tablet (  81 mg total) by mouth daily. (Patient not taking: Reported on 10/28/2023)   MYRBETRIQ 50 MG TB24 tablet Take 50 mg by mouth daily. (Patient not taking: Reported on 10/28/2023)   No facility-administered encounter medications on file as of 10/28/2023.    Allergies (verified) Metronidazole   History: Past Medical History:  Diagnosis Date   Allergy    seasonal   Arthritis    Cataract    Depression    Diabetes mellitus without complication (HCC)    H/O fibromyalgia    had for over 10 years   Hyperlipidemia     Hypertension    Post-operative nausea and vomiting    Past Surgical History:  Procedure Laterality Date   APPENDECTOMY     COLONOSCOPY     kidney stone removal  2013   used a basket extraction   RETINAL DETACHMENT SURGERY Left 06/25/2023   Family History  Problem Relation Age of Onset   Diabetes Mother    Heart disease Father    Emphysema Father    CVA Sister    AAA (abdominal aortic aneurysm) Brother    Social History   Socioeconomic History   Marital status: Married    Spouse name: Not on file   Number of children: Not on file   Years of education: Not on file   Highest education level: Professional school degree (e.g., MD, DDS, DVM, JD)  Occupational History   Not on file  Tobacco Use   Smoking status: Former    Current packs/day: 0.00    Average packs/day: 1 pack/day for 47.0 years (47.0 ttl pk-yrs)    Types: Cigarettes    Start date: 01/11/1976    Quit date: 01/11/2023    Years since quitting: 0.7   Smokeless tobacco: Never  Vaping Use   Vaping status: Never Used  Substance and Sexual Activity   Alcohol use: No   Drug use: No   Sexual activity: Not on file  Other Topics Concern   Not on file  Social History Narrative   Not on file   Social Drivers of Health   Financial Resource Strain: Low Risk  (10/28/2023)   Overall Financial Resource Strain (CARDIA)    Difficulty of Paying Living Expenses: Not hard at all  Food Insecurity: No Food Insecurity (10/28/2023)   Hunger Vital Sign    Worried About Running Out of Food in the Last Year: Never true    Ran Out of Food in the Last Year: Never true  Transportation Needs: No Transportation Needs (10/28/2023)   PRAPARE - Administrator, Civil Service (Medical): No    Lack of Transportation (Non-Medical): No  Physical Activity: Inactive (10/28/2023)   Exercise Vital Sign    Days of Exercise per Week: 0 days    Minutes of Exercise per Session: 0 min  Stress: Stress Concern Present (10/28/2023)   [employer] of [employer]ational [employer] - Occupational Stress Questionnaire    Feeling of Stress : To some extent  Social Connections: Socially Isolated (10/28/2023)   Social Connection and Isolation Panel [NHANES]    Frequency of Communication with Friends and Family: More than three times a week    Frequency of Social Gatherings with Friends and Family: More than three times a week    Attends Religious Services: Never    Database administrator or Organizations: No    Attends Banker Meetings: Never    Marital Status: Divorced    Tobacco Counseling Counseling given: Not  Answered    Clinical Intake:  Pre-visit preparation completed: Yes  Pain : 0-10 Pain Score: 7  Pain Type: Chronic pain Pain Location: Hip Pain Orientation: Right Pain Descriptors / Indicators: Aching Pain Onset: More than a month ago Pain Frequency: Constant     Nutritional Risks: None Diabetes: No  How often do you need to have someone help you when you read instructions, pamphlets, or other written materials from your doctor or pharmacy?: 1 - Never  Interpreter Needed?: No  Information entered by :: NAllen LPN   Activities of Daily Living     10/28/2023    1:39 PM  In your present state of health, do you have any difficulty performing the following activities:  Hearing? 1  Comment minimal hearing loss, but no hearing aid needed  Vision? 1  Comment left eye surgery  Difficulty concentrating or making decisions? 1  Walking or climbing stairs? 1  Comment due to hip  Dressing or bathing? 0  Doing errands, shopping? 0  Preparing Food and eating ? N  Using the Toilet? N  In the past six months, have you accidently leaked urine? Y  Comment seen an urologist  Do you have problems with loss of bowel control? N  Managing your Medications? N  Managing your Finances? N  Housekeeping or managing your Housekeeping? N    Patient Care Team: Mliss Sax, MD as PCP - General (Family  Medicine)  Indicate any recent Medical Services you may have received from other than [facility] providers in the past year (date may be approximate).     Assessment:   This is a routine wellness examination for [place].  Hearing/Vision screen Hearing Screening - Comments:: Denies hearing issues Vision Screening - Comments:: Had eye surgery in 06/25/2023, [facility]   Goals Addressed             This Visit's Progress    Patient Stated       10/28/2023, wants to get medical problems situated and increase exercise       Depression Screen     10/28/2023    1:57 PM 02/08/2023    9:14 AM 10/31/2022    9:47 AM 10/31/2022    8:52 AM 10/26/2022    1:07 PM 11/29/2021    9:25 AM 08/30/2021   10:35 AM  PHQ 2/9 Scores  PHQ - 2 Score 1 0 2 0 1 0 1  PHQ- 9 Score   12        Fall Risk     10/28/2023    1:54 PM 02/08/2023    9:14 AM 10/31/2022    8:52 AM 10/26/2022    1:06 PM 11/29/2021    9:25 AM  Fall Risk   Falls in the past year? 1 0 0 1 0  Comment tripped while hiking, tripped over laundry basket   tripped, knocked over by dogs   Number falls in past yr: 0 0 0 1 0  Injury with Fall? 1 0 0 1   Comment broke toe, impaled face with straw   torn tendon, broke knee   Risk for fall due to : Medication side effect;History of fall(s) History of fall(s);No Fall Risks No Fall Risks History of fall(s);Medication side effect   Follow up Falls prevention discussed;Falls evaluation completed Falls evaluation completed Falls evaluation completed Falls prevention discussed;Education provided;Falls evaluation completed     MEDICARE RISK AT HOME:  Medicare Risk at Home Any stairs in or around the home?: Yes  If so, are there any without handrails?: No Home free of loose throw rugs in walkways, pet beds, electrical cords, etc?: Yes Adequate lighting in your home to reduce risk of falls?: Yes Life alert?: No Use of a cane, walker or w/c?: No Grab bars in the bathroom?: Yes Shower chair or bench in  shower?: Yes Elevated toilet seat or a handicapped toilet?: Yes  TIMED UP AND GO:  Was the test performed?  No  Cognitive Function: 6CIT completed        10/28/2023    1:58 PM 10/26/2022    1:10 PM  6CIT Screen  What Year? 0 points 0 points  What month? 0 points 0 points  What time? 0 points 0 points  Count back from 20 0 points 0 points  Months in reverse 0 points 0 points  Repeat phrase 2 points 2 points  Total Score 2 points 2 points    Immunizations Immunization History  Administered Date(s) Administered   Fluad Quad(high Dose 65+) 06/29/2022   Fluad Trivalent(High Dose 65+) 05/09/2023   Influenza,inj,Quad PF,6+ Mos 04/25/2018, 04/28/2019, 05/02/2020, 04/28/2021   Influenza-Unspecified 05/12/2016, 04/17/2017   Janssen (J&J) SARS-COV-2 Vaccination 08/01/2022   Moderna Sars-Covid-2 Vaccination 10/28/2019, 11/27/2019, 08/18/2020   PNEUMOCOCCAL CONJUGATE-20 08/01/2022   Pneumococcal Polysaccharide-23 07/25/2017   Pneumococcal-Unspecified 08/01/2022   Tdap 10/19/2015   Zoster Recombinant(Shingrix) 11/06/2021, 06/29/2022    Screening Tests Health Maintenance  Topic Date Due   Colonoscopy  08/12/2022   HEMOGLOBIN A1C  08/10/2023   Diabetic kidney evaluation - Urine ACR  10/31/2023   FOOT EXAM  10/31/2023   MAMMOGRAM  11/15/2023   OPHTHALMOLOGY EXAM  11/26/2023   Diabetic kidney evaluation - eGFR measurement  02/26/2024   Lung Cancer Screening  03/31/2024   Medicare Annual Wellness (AWV)  10/27/2024   DTaP/Tdap/Td (2 - Td or Tdap) 10/18/2025   Pneumonia Vaccine 47+ Years old  Completed   INFLUENZA VACCINE  Completed   DEXA SCAN  Completed   Hepatitis C Screening  Completed   Zoster Vaccines- Shingrix  Completed   HPV VACCINES  Aged Out   COVID-19 Vaccine  Discontinued    Health Maintenance  Health Maintenance Due  Topic Date Due   Colonoscopy  08/12/2022   HEMOGLOBIN A1C  08/10/2023   Diabetic kidney evaluation - Urine ACR  10/31/2023   Health  Maintenance Items Addressed: States up to date with colonoscopy  Additional Screening:  Vision Screening: Recommended annual ophthalmology exams for early detection of glaucoma and other disorders of the eye.  Dental Screening: Recommended annual dental exams for proper oral hygiene  Community Resource Referral / Chronic Care Management: CRR required this visit?  No   CCM required this visit?  No     Plan:     I have personally reviewed and noted the following in the patient's chart:   Medical and social history Use of alcohol, tobacco or illicit drugs  Current medications and supplements including opioid prescriptions. Patient is not currently taking opioid prescriptions. Functional ability and status Nutritional status Physical activity Advanced directives List of other physicians Hospitalizations, surgeries, and ER visits in previous 12 months Vitals Screenings to include cognitive, depression, and falls Referrals and appointments  In addition, I have reviewed and discussed with patient certain preventive protocols, quality metrics, and best practice recommendations. A written personalized care plan for preventive services as well as general preventive health recommendations were provided to patient.     Barb Merino, LPN   1/61/0960  After Visit Summary: (Pick Up) Due to this being a telephonic visit, with patients personalized plan was offered to patient and patient has requested to Pick up at office.  Notes: Nothing significant to report at this time.

## 2023-11-04 ENCOUNTER — Ambulatory Visit (INDEPENDENT_AMBULATORY_CARE_PROVIDER_SITE_OTHER): Admitting: Family Medicine

## 2023-11-04 ENCOUNTER — Encounter: Payer: Self-pay | Admitting: Family Medicine

## 2023-11-04 VITALS — BP 142/72 | HR 105 | Temp 97.6°F | Ht 63.0 in | Wt 221.2 lb

## 2023-11-04 DIAGNOSIS — E7800 Pure hypercholesterolemia, unspecified: Secondary | ICD-10-CM

## 2023-11-04 DIAGNOSIS — E538 Deficiency of other specified B group vitamins: Secondary | ICD-10-CM | POA: Diagnosis not present

## 2023-11-04 DIAGNOSIS — Z7984 Long term (current) use of oral hypoglycemic drugs: Secondary | ICD-10-CM

## 2023-11-04 DIAGNOSIS — E119 Type 2 diabetes mellitus without complications: Secondary | ICD-10-CM | POA: Diagnosis not present

## 2023-11-04 DIAGNOSIS — I10 Essential (primary) hypertension: Secondary | ICD-10-CM | POA: Diagnosis not present

## 2023-11-04 NOTE — Progress Notes (Signed)
 Established Patient Office Visit   Subjective:  Patient ID: Paula Kelly, female    DOB: [birth year]  Age: 68 y.o. MRN: [id number]  Chief Complaint  Patient presents with   Medical Management of Chronic Issues    6 month follow up. Pt is not fasting.     HPI Encounter Diagnoses  Name Primary?   Elevated cholesterol Yes   Controlled type 2 diabetes mellitus without complication, without long-term current use of insulin (HCC)    Essential hypertension    B12 deficiency    For follow-up of above.  Status post retinal detachment this past October of OS.  Recent cataract extraction.  Continues follow-up with ophthalmology.  Vertical lines are wavy.  Continues Atacand 32 mg for hypertension.  Continues metformin 500 mg twice daily for type 2 diabetes and atorvastatin 80 with Zetia for elevated cholesterol.   Review of Systems  Constitutional: Negative.   HENT: Negative.    Eyes:  Positive for blurred vision. Negative for double vision, discharge and redness.  Respiratory: Negative.    Cardiovascular: Negative.   Gastrointestinal:  Negative for abdominal pain.  Genitourinary: Negative.   Musculoskeletal: Negative.  Negative for myalgias.  Skin:  Negative for rash.  Neurological:  Negative for tingling, loss of consciousness and weakness.  Endo/Heme/Allergies:  Negative for polydipsia.     Current Outpatient Medications:    amphetamine-dextroamphetamine (ADDERALL XR) 30 MG 24 hr capsule, Will start this prescription once the 20 mg prescription has run out., Disp: , Rfl:    Ascorbic Acid (VITAMIN C) 100 MG tablet, Take 100 mg by mouth daily., Disp: , Rfl:    atorvastatin (LIPITOR) 80 MG tablet, TAKE 1 TABLET(80 MG) BY MOUTH DAILY, Disp: 90 tablet, Rfl: 3   buPROPion (WELLBUTRIN XL) 150 MG 24 hr tablet, Take 1 tablet by mouth daily., Disp: , Rfl:    candesartan (ATACAND) 32 MG tablet, Take 1 tablet (32 mg total) by mouth daily., Disp: 100 tablet, Rfl: 3   Cholecalciferol  (VITAMIN D3) 50 MCG (2000 UT) TABS, Take 1 tablet by mouth daily., Disp: 90 tablet, Rfl: 2   Coenzyme Q10 200 MG capsule, , Disp: , Rfl:    aspirin EC 81 MG tablet, Take 1 tablet (81 mg total) by mouth daily. (Patient not taking: Reported on 11/04/2023), Disp: 365 tablet, Rfl: 0   Cyanocobalamin (VITAMIN B 12 PO), Take by mouth., Disp: , Rfl:    diclofenac Sodium (VOLTAREN) 1 % GEL, Apply a small grape sized dollop to the left knee every 6 hours as needed., Disp: 150 g, Rfl: 1   DULoxetine (CYMBALTA) 60 MG capsule, Take 2 capsules (120 mg total) by mouth daily., Disp: 180 capsule, Rfl: 1   ezetimibe (ZETIA) 10 MG tablet, TAKE 1 TABLET(10 MG) BY MOUTH DAILY, Disp: 90 tablet, Rfl: 3   fish oil-omega-3 fatty acids 1000 MG capsule, Take by mouth., Disp: , Rfl:    folic acid (FOLVITE) 1 MG tablet, Take 1 mg by mouth daily., Disp: , Rfl:    glucose blood (FREESTYLE LITE) test strip, Use to test 1-2 times daily., Disp: 100 each, Rfl: 5   metFORMIN (GLUCOPHAGE) 500 MG tablet, TAKE 1 TABLET(500 MG) BY MOUTH TWICE DAILY, Disp: 180 tablet, Rfl: 2   Multiple Vitamin (MULTI-VITAMINS) TABS, Take 1 tablet by mouth daily., Disp: , Rfl:    MYRBETRIQ 50 MG TB24 tablet, Take 50 mg by mouth daily. (Patient not taking: Reported on 10/28/2023), Disp: , Rfl:    QUEtiapine (SEROQUEL) 25  MG tablet, Take 25 mg by mouth at bedtime. Takes 12.5 mg, Disp: , Rfl:    vitamin E 400 UNIT capsule, Take 1 capsule by mouth daily., Disp: , Rfl:    Objective:     BP (!) 142/72   Pulse (!) 105   Temp 97.6 F (36.4 C)   Ht 5\' 3"  (1.6 m)   Wt 221 lb 3.2 oz (100.3 kg)   SpO2 95%   BMI 39.18 kg/m  BP Readings from Last 3 Encounters:  11/04/23 (!) 142/72  05/09/23 (!) 152/82  02/26/23 138/70   Wt Readings from Last 3 Encounters:  11/04/23 221 lb 3.2 oz (100.3 kg)  05/09/23 210 lb (95.3 kg)  02/26/23 207 lb 12.8 oz (94.3 kg)      Physical Exam Constitutional:      General: She is not in acute distress.    Appearance:  Normal appearance. She is not ill-appearing, toxic-appearing or diaphoretic.  HENT:     Head: Normocephalic and atraumatic.     Right Ear: External ear normal.     Left Ear: External ear normal.     Mouth/Throat:     Mouth: Mucous membranes are moist.     Pharynx: Oropharynx is clear. No oropharyngeal exudate or posterior oropharyngeal erythema.  Eyes:     General: No scleral icterus.       Right eye: No discharge.        Left eye: No discharge.     Extraocular Movements: Extraocular movements intact.     Conjunctiva/sclera: Conjunctivae normal.   Cardiovascular:     Rate and Rhythm: Normal rate and regular rhythm.     Pulses:          Dorsalis pedis pulses are 1+ on the right side and 1+ on the left side.       Posterior tibial pulses are 1+ on the right side and 1+ on the left side.  Pulmonary:     Effort: Pulmonary effort is normal. No respiratory distress.     Breath sounds: Normal breath sounds.  Abdominal:     General: Bowel sounds are normal.     Tenderness: There is no abdominal tenderness. There is no guarding.  Musculoskeletal:     Cervical back: No rigidity or tenderness.  Skin:    General: Skin is warm and dry.  Neurological:     Mental Status: She is alert and oriented to person, place, and time.  Psychiatric:        Mood and Affect: Mood normal.        Behavior: Behavior normal.    Diabetic Foot Exam - Simple   Simple Foot Form Diabetic Foot exam was performed with the following findings: Yes 11/04/2023 11:38 AM  Visual Inspection See comments: Yes Sensation Testing Intact to touch and monofilament testing bilaterally: Yes Pulse Check Posterior Tibialis and Dorsalis pulse intact bilaterally: Yes Comments Feet have relaxed arches.  No lesions or ulcerations.  Capillary refill is brisk.       No results found for any visits on 11/04/23.    The ASCVD Risk score (Arnett DK, et al., 2019) failed to calculate for the following reasons:   The valid total  cholesterol range is 130 to 320 mg/dL    Assessment & Plan:   Elevated cholesterol -     Comprehensive metabolic panel; Future -     Lipid panel; Future  Controlled type 2 diabetes mellitus without complication, without long-term current use of insulin (HCC) -  CBC; Future -     Comprehensive metabolic panel; Future -     Hemoglobin A1c; Future -     Urinalysis, Routine w reflex microscopic; Future -     Microalbumin / creatinine urine ratio; Future  Essential hypertension -     CBC; Future -     Comprehensive metabolic panel; Future  B12 deficiency -     Vitamin B12; Future    Return in about 3 months (around 02/04/2024).  Will return fasting for above ordered blood work.  Encouraged exercise and weight loss.  Information given on exercising to lose weight.  Follow-up with ophthalmology as directed.  Encouraged her to check and record her blood pressures periodically.  Mliss Sax, MD

## 2023-11-06 ENCOUNTER — Other Ambulatory Visit

## 2023-12-16 ENCOUNTER — Other Ambulatory Visit: Payer: Self-pay | Admitting: Family Medicine

## 2023-12-16 ENCOUNTER — Other Ambulatory Visit: Payer: Self-pay | Admitting: Internal Medicine

## 2023-12-16 DIAGNOSIS — E7800 Pure hypercholesterolemia, unspecified: Secondary | ICD-10-CM

## 2023-12-16 DIAGNOSIS — E119 Type 2 diabetes mellitus without complications: Secondary | ICD-10-CM

## 2024-01-01 ENCOUNTER — Other Ambulatory Visit

## 2024-01-31 ENCOUNTER — Other Ambulatory Visit (INDEPENDENT_AMBULATORY_CARE_PROVIDER_SITE_OTHER)

## 2024-01-31 DIAGNOSIS — E538 Deficiency of other specified B group vitamins: Secondary | ICD-10-CM | POA: Diagnosis not present

## 2024-01-31 DIAGNOSIS — I10 Essential (primary) hypertension: Secondary | ICD-10-CM

## 2024-01-31 DIAGNOSIS — E119 Type 2 diabetes mellitus without complications: Secondary | ICD-10-CM

## 2024-01-31 DIAGNOSIS — E7800 Pure hypercholesterolemia, unspecified: Secondary | ICD-10-CM

## 2024-01-31 LAB — LIPID PANEL
Cholesterol: 154 mg/dL (ref 0–200)
HDL: 48.7 mg/dL (ref >39.00)
LDL Cholesterol: 76 mg/dL (ref 0–99)
NonHDL: 104.87
Total CHOL/HDL Ratio: 3
Triglycerides: 146 mg/dL (ref 0.0–149.0)
VLDL: 29.2 mg/dL (ref 0.0–40.0)

## 2024-01-31 LAB — CBC
HCT: 41.9 % (ref 36.0–46.0)
Hemoglobin: 14.1 g/dL (ref 12.0–15.0)
MCHC: 33.6 g/dL (ref 30.0–36.0)
MCV: 86.8 fl (ref 78.0–100.0)
Platelets: 381 10*3/uL (ref 150.0–400.0)
RBC: 4.83 Mil/uL (ref 3.87–5.11)
RDW: 14 % (ref 11.5–15.5)
WBC: 6.9 10*3/uL (ref 4.0–10.5)

## 2024-01-31 LAB — COMPREHENSIVE METABOLIC PANEL WITH GFR
ALT: 24 U/L (ref 0–35)
AST: 23 U/L (ref 0–37)
Albumin: 4.9 g/dL (ref 3.5–5.2)
Alkaline Phosphatase: 97 U/L (ref 39–117)
BUN: 16 mg/dL (ref 6–23)
CO2: 29 meq/L (ref 19–32)
Calcium: 10.1 mg/dL (ref 8.4–10.5)
Chloride: 103 meq/L (ref 96–112)
Creatinine, Ser: 0.84 mg/dL (ref 0.40–1.20)
GFR: 71.98 mL/min (ref >60.00)
Glucose, Bld: 126 mg/dL — ABNORMAL HIGH (ref 70–99)
Potassium: 4.4 meq/L (ref 3.5–5.1)
Sodium: 139 meq/L (ref 135–145)
Total Bilirubin: 0.5 mg/dL (ref 0.2–1.2)
Total Protein: 7.6 g/dL (ref 6.0–8.3)

## 2024-01-31 LAB — URINALYSIS, ROUTINE W REFLEX MICROSCOPIC
Bilirubin Urine: NEGATIVE
Hgb urine dipstick: NEGATIVE
Ketones, ur: NEGATIVE
Leukocytes,Ua: NEGATIVE
Nitrite: NEGATIVE
Specific Gravity, Urine: 1.02 (ref 1.000–1.030)
Total Protein, Urine: NEGATIVE
Urine Glucose: NEGATIVE
Urobilinogen, UA: 0.2 (ref 0.0–1.0)
pH: 7 (ref 5.0–8.0)

## 2024-01-31 LAB — HEMOGLOBIN A1C: Hgb A1c MFr Bld: 7.3 % — ABNORMAL HIGH (ref 4.6–6.5)

## 2024-01-31 LAB — MICROALBUMIN / CREATININE URINE RATIO
Creatinine,U: 138.1 mg/dL
Microalb Creat Ratio: 11.8 mg/g (ref 0.0–30.0)
Microalb, Ur: 1.6 mg/dL (ref 0.0–1.9)

## 2024-02-03 ENCOUNTER — Other Ambulatory Visit

## 2024-02-03 ENCOUNTER — Other Ambulatory Visit: Payer: Self-pay

## 2024-02-03 ENCOUNTER — Ambulatory Visit: Payer: Self-pay | Admitting: Family Medicine

## 2024-02-03 DIAGNOSIS — E538 Deficiency of other specified B group vitamins: Secondary | ICD-10-CM

## 2024-02-03 NOTE — Addendum Note (Signed)
 Addended by: Darleene Ege on: 02/03/2024 12:03 PM   Modules accepted: Orders

## 2024-02-03 NOTE — Addendum Note (Signed)
 Addended by: Darleene Ege on: 02/03/2024 12:04 PM   Modules accepted: Orders

## 2024-02-04 ENCOUNTER — Ambulatory Visit: Admitting: Family Medicine

## 2024-02-04 LAB — VITAMIN B12: Vitamin B-12: 702 pg/mL (ref 232–1245)

## 2024-02-06 ENCOUNTER — Ambulatory Visit: Admitting: Family Medicine

## 2024-02-06 ENCOUNTER — Encounter: Payer: Self-pay | Admitting: Family Medicine

## 2024-02-06 VITALS — BP 150/82 | HR 114 | Temp 98.4°F | Ht 64.0 in | Wt 226.8 lb

## 2024-02-06 DIAGNOSIS — E119 Type 2 diabetes mellitus without complications: Secondary | ICD-10-CM

## 2024-02-06 DIAGNOSIS — Z7984 Long term (current) use of oral hypoglycemic drugs: Secondary | ICD-10-CM

## 2024-02-06 DIAGNOSIS — I10 Essential (primary) hypertension: Secondary | ICD-10-CM

## 2024-02-06 MED ORDER — METFORMIN HCL 500 MG PO TABS: 500.0000 mg | ORAL_TABLET | Freq: Two times a day (BID) | ORAL | 2 refills | Status: DC

## 2024-02-06 MED ORDER — AMLODIPINE BESYLATE 5 MG PO TABS: 5.0000 mg | ORAL_TABLET | Freq: Every day | ORAL | 1 refills | Status: DC

## 2024-02-06 NOTE — Progress Notes (Signed)
 Established Patient Office Visit   Subjective:  Patient ID: Paula Kelly, female    DOB: [birth year]  Age: 68 y.o. MRN: [id number]  Chief Complaint  Patient presents with   Follow-up    3 month follow up  Discuss restarting Metformin      HPI Encounter Diagnoses  Name Primary?   Controlled type 2 diabetes mellitus without complication, without long-term current use of insulin (HCC) Yes   Essential hypertension    For follow-up of above.  Status post increase in A1c, patient had not been taking her metformin .  She is taking the Atacand  32 mg daily for blood pressure.  Again, this has been a stressful time for her.  OS continues to recover after a retinal tear.  Vision is not back to normal as of yet.   Review of Systems  Constitutional: Negative.   HENT: Negative.    Eyes:  Negative for blurred vision, discharge and redness.  Respiratory: Negative.    Cardiovascular: Negative.   Gastrointestinal:  Negative for abdominal pain.  Genitourinary: Negative.   Musculoskeletal: Negative.  Negative for myalgias.  Skin:  Negative for rash.  Neurological:  Negative for tingling, loss of consciousness and weakness.  Endo/Heme/Allergies:  Negative for polydipsia.     Current Outpatient Medications:    amLODipine (NORVASC) 5 MG tablet, Take 1 tablet (5 mg total) by mouth daily., Disp: 90 tablet, Rfl: 1   amphetamine -dextroamphetamine (ADDERALL XR) 30 MG 24 hr capsule, Will start this prescription once the 20 mg prescription has run out., Disp: , Rfl:    Ascorbic Acid (VITAMIN C) 100 MG tablet, Take 100 mg by mouth daily., Disp: , Rfl:    atorvastatin  (LIPITOR) 80 MG tablet, TAKE 1 TABLET DAILY, Disp: 90 tablet, Rfl: 3   buPROPion (WELLBUTRIN XL) 150 MG 24 hr tablet, Take 1 tablet by mouth daily., Disp: , Rfl:    candesartan  (ATACAND ) 32 MG tablet, Take 1 tablet (32 mg total) by mouth daily., Disp: 100 tablet, Rfl: 3   Cholecalciferol  (VITAMIN D3) 50 MCG (2000 UT) TABS, Take 1 tablet  by mouth daily., Disp: 90 tablet, Rfl: 2   Coenzyme Q10 200 MG capsule, , Disp: , Rfl:    Cyanocobalamin (VITAMIN B 12 PO), Take by mouth., Disp: , Rfl:    DULoxetine  (CYMBALTA ) 60 MG capsule, Take 2 capsules (120 mg total) by mouth daily., Disp: 180 capsule, Rfl: 1   ezetimibe  (ZETIA ) 10 MG tablet, TAKE 1 TABLET DAILY, Disp: 90 tablet, Rfl: 3   fish oil-omega-3 fatty acids 1000 MG capsule, Take by mouth., Disp: , Rfl:    folic acid  (FOLVITE ) 1 MG tablet, Take 1 mg by mouth daily., Disp: , Rfl:    Multiple Vitamin (MULTI-VITAMINS) TABS, Take 1 tablet by mouth daily., Disp: , Rfl:    QUEtiapine (SEROQUEL) 25 MG tablet, Take 25 mg by mouth at bedtime. Takes 12.5 mg (Patient taking differently: Take 12.5 mg by mouth at bedtime. Takes 12.5 mg), Disp: , Rfl:    Vibegron (GEMTESA) 75 MG TABS, Take 75 mg by mouth daily in the afternoon., Disp: , Rfl:    vitamin E 400 UNIT capsule, Take 1 capsule by mouth daily., Disp: , Rfl:    aspirin  EC 81 MG tablet, Take 1 tablet (81 mg total) by mouth daily. (Patient not taking: Reported on 02/06/2024), Disp: 365 tablet, Rfl: 0   diclofenac  Sodium (VOLTAREN ) 1 % GEL, Apply a small grape sized dollop to the left knee every 6 hours as needed. (  Patient not taking: Reported on 02/06/2024), Disp: 150 g, Rfl: 1   glucose blood (FREESTYLE LITE) test strip, Use to test 1-2 times daily. (Patient not taking: Reported on 02/06/2024), Disp: 100 each, Rfl: 5   metFORMIN  (GLUCOPHAGE ) 500 MG tablet, Take 1 tablet (500 mg total) by mouth 2 (two) times daily with a meal., Disp: 180 tablet, Rfl: 2   MYRBETRIQ 50 MG TB24 tablet, Take 50 mg by mouth daily. (Patient not taking: Reported on 02/06/2024), Disp: , Rfl:    Objective:     BP (!) 150/82 (BP Location: Left Arm, Patient Position: Sitting, Cuff Size: Large)   Pulse (!) 114   Temp 98.4 F (36.9 C) (Temporal)   Ht 5' 4 (1.626 m)   Wt 226 lb 12.8 oz (102.9 kg)   SpO2 99%   BMI 38.93 kg/m  BP Readings from Last 3 Encounters:   02/06/24 (!) 150/82  11/04/23 (!) 142/72  05/09/23 (!) 152/82   Wt Readings from Last 3 Encounters:  02/06/24 226 lb 12.8 oz (102.9 kg)  11/04/23 221 lb 3.2 oz (100.3 kg)  05/09/23 210 lb (95.3 kg)      Physical Exam Constitutional:      General: She is not in acute distress.    Appearance: Normal appearance. She is not ill-appearing, toxic-appearing or diaphoretic.  HENT:     Head: Normocephalic and atraumatic.     Right Ear: External ear normal.     Left Ear: External ear normal.   Eyes:     General: No scleral icterus.       Right eye: No discharge.        Left eye: No discharge.     Extraocular Movements: Extraocular movements intact.     Conjunctiva/sclera: Conjunctivae normal.   Pulmonary:     Effort: Pulmonary effort is normal. No respiratory distress.   Skin:    General: Skin is warm and dry.   Neurological:     Mental Status: She is alert and oriented to person, place, and time.   Psychiatric:        Mood and Affect: Mood normal.        Behavior: Behavior normal.      No results found for any visits on 02/06/24.    The 10-year ASCVD risk score (Arnett DK, et al., 2019) is: 21.2%    Assessment & Plan:   Controlled type 2 diabetes mellitus without complication, without long-term current use of insulin (HCC) -     metFORMIN  HCl; Take 1 tablet (500 mg total) by mouth 2 (two) times daily with a meal.  Dispense: 180 tablet; Refill: 2  Essential hypertension -     amLODIPine Besylate; Take 1 tablet (5 mg total) by mouth daily.  Dispense: 90 tablet; Refill: 1    Return in about 3 months (around 05/08/2024).  Restart metformin  500 twice daily.  Will add amlodipine 5 mg daily for improved blood pressure control.  Patient is reluctant to take a diuretic because she already suffers from urinary incontinence.  Tonna Frederic, MD

## 2024-02-11 ENCOUNTER — Telehealth: Payer: Self-pay | Admitting: Family Medicine

## 2024-02-11 NOTE — Telephone Encounter (Signed)
 02/25/2023 late arrival (appt @ 9:00a, arrival 10:53a) 05/06/2023 late arrival (appt @ 10:00a, arrival 11:00a) 10/21/2023 late arrival (appt @ 9:20a, arrival 10:15a) 02/04/2024 no show/called after appt time to reschedule  Sent another final warning letter via mail and [employer]

## 2024-03-26 ENCOUNTER — Encounter: Payer: Self-pay | Admitting: Acute Care

## 2024-05-11 ENCOUNTER — Ambulatory Visit: Admitting: Family Medicine

## 2024-05-22 ENCOUNTER — Other Ambulatory Visit: Payer: Self-pay | Admitting: Family Medicine

## 2024-05-22 DIAGNOSIS — I10 Essential (primary) hypertension: Secondary | ICD-10-CM

## 2024-05-22 DIAGNOSIS — E119 Type 2 diabetes mellitus without complications: Secondary | ICD-10-CM

## 2024-06-18 ENCOUNTER — Other Ambulatory Visit: Payer: Self-pay | Admitting: Family Medicine

## 2024-06-18 DIAGNOSIS — I10 Essential (primary) hypertension: Secondary | ICD-10-CM

## 2024-06-18 DIAGNOSIS — E119 Type 2 diabetes mellitus without complications: Secondary | ICD-10-CM

## 2024-06-29 ENCOUNTER — Ambulatory Visit (INDEPENDENT_AMBULATORY_CARE_PROVIDER_SITE_OTHER): Admitting: Family Medicine

## 2024-06-29 ENCOUNTER — Encounter: Payer: Self-pay | Admitting: Family Medicine

## 2024-06-29 VITALS — BP 128/80 | HR 96 | Temp 98.4°F | Ht 64.0 in | Wt 223.2 lb

## 2024-06-29 DIAGNOSIS — Z23 Encounter for immunization: Secondary | ICD-10-CM

## 2024-06-29 DIAGNOSIS — E1165 Type 2 diabetes mellitus with hyperglycemia: Secondary | ICD-10-CM | POA: Diagnosis not present

## 2024-06-29 DIAGNOSIS — Z6838 Body mass index (BMI) 38.0-38.9, adult: Secondary | ICD-10-CM | POA: Diagnosis not present

## 2024-06-29 DIAGNOSIS — I10 Essential (primary) hypertension: Secondary | ICD-10-CM | POA: Diagnosis not present

## 2024-06-29 LAB — COMPREHENSIVE METABOLIC PANEL WITH GFR
ALT: 19 U/L (ref 0–35)
AST: 19 U/L (ref 0–37)
Albumin: 4.5 g/dL (ref 3.5–5.2)
Alkaline Phosphatase: 83 U/L (ref 39–117)
BUN: 16 mg/dL (ref 6–23)
CO2: 27 meq/L (ref 19–32)
Calcium: 9.6 mg/dL (ref 8.4–10.5)
Chloride: 105 meq/L (ref 96–112)
Creatinine, Ser: 0.74 mg/dL (ref 0.40–1.20)
GFR: 83.57 mL/min (ref >60.00)
Glucose, Bld: 85 mg/dL (ref 70–99)
Potassium: 4.3 meq/L (ref 3.5–5.1)
Sodium: 138 meq/L (ref 135–145)
Total Bilirubin: 0.3 mg/dL (ref 0.2–1.2)
Total Protein: 6.9 g/dL (ref 6.0–8.3)

## 2024-06-29 LAB — HEMOGLOBIN A1C: Hgb A1c MFr Bld: 6.9 % — ABNORMAL HIGH (ref 4.6–6.5)

## 2024-06-29 NOTE — Progress Notes (Addendum)
 Established Patient Office Visit   Subjective:  Patient ID: Paula Kelly, female    DOB: 03-05-1956  Age: 68 y.o. MRN: [id number]  Chief Complaint  Patient presents with   Medical Management of Chronic Issues    Follow up. Pt is not fasting.     HPI Encounter Diagnoses  Name Primary?   Uncontrolled type 2 diabetes mellitus with hyperglycemia (HCC) Yes   BMI 38.0-38.9,adult    Immunization due    Essential hypertension    Follow-up of above.  Status post retinal tear with repair, patient's vision has progressed as far as it we will, at this point.  Difficult for her to read but she is listening to audiobooks.  She continues to exercise for at least 30 minutes by walking her 2 dogs.  She has been taking her metformin .  Her daughter has moved out.  History of mild apnea.  She is not currently using a CPAP.   Review of Systems  Constitutional: Negative.   HENT: Negative.    Eyes:  Negative for blurred vision, discharge and redness.  Respiratory: Negative.    Cardiovascular: Negative.   Gastrointestinal:  Negative for abdominal pain.  Genitourinary: Negative.   Musculoskeletal: Negative.  Negative for myalgias.  Skin:  Negative for rash.  Neurological:  Negative for tingling, loss of consciousness and weakness.  Endo/Heme/Allergies:  Negative for polydipsia.     Current Outpatient Medications:    amphetamine -dextroamphetamine (ADDERALL XR) 30 MG 24 hr capsule, Will start this prescription once the 20 mg prescription has run out., Disp: , Rfl:    Ascorbic Acid (VITAMIN C) 100 MG tablet, Take 100 mg by mouth daily., Disp: , Rfl:    aspirin  EC 81 MG tablet, Take 1 tablet (81 mg total) by mouth daily., Disp: 365 tablet, Rfl: 0   atorvastatin  (LIPITOR) 80 MG tablet, TAKE 1 TABLET DAILY, Disp: 90 tablet, Rfl: 3   buPROPion (WELLBUTRIN XL) 150 MG 24 hr tablet, Take 1 tablet by mouth daily., Disp: , Rfl:    candesartan  (ATACAND ) 32 MG tablet, TAKE 1 TABLET DAILY, Disp: 30  tablet, Rfl: 0   Cholecalciferol  (VITAMIN D3) 50 MCG (2000 UT) TABS, Take 1 tablet by mouth daily., Disp: 90 tablet, Rfl: 2   Coenzyme Q10 200 MG capsule, , Disp: , Rfl:    Cyanocobalamin (VITAMIN B 12 PO), Take by mouth., Disp: , Rfl:    DULoxetine  (CYMBALTA ) 60 MG capsule, Take 2 capsules (120 mg total) by mouth daily., Disp: 180 capsule, Rfl: 1   ezetimibe  (ZETIA ) 10 MG tablet, TAKE 1 TABLET DAILY, Disp: 90 tablet, Rfl: 3   fish oil-omega-3 fatty acids 1000 MG capsule, Take by mouth., Disp: , Rfl:    folic acid  (FOLVITE ) 1 MG tablet, Take 1 mg by mouth daily., Disp: , Rfl:    glucose blood (FREESTYLE LITE) test strip, Use to test 1-2 times daily., Disp: 100 each, Rfl: 5   metFORMIN  (GLUCOPHAGE ) 500 MG tablet, Take 1 tablet (500 mg total) by mouth 2 (two) times daily with a meal., Disp: 180 tablet, Rfl: 2   Multiple Vitamin (MULTI-VITAMINS) TABS, Take 1 tablet by mouth daily., Disp: , Rfl:    QUEtiapine (SEROQUEL) 25 MG tablet, Take 25 mg by mouth at bedtime. Takes 12.5 mg, Disp: , Rfl:    tirzepatide  (MOUNJARO ) 2.5 MG/0.5ML Pen, Inject 2.5 mg into the skin once a week., Disp: 2 mL, Rfl: 3   Vibegron (GEMTESA) 75 MG TABS, Take 75 mg by mouth daily in  the afternoon., Disp: , Rfl:    vitamin E 400 UNIT capsule, Take 1 capsule by mouth daily., Disp: , Rfl:    amLODipine  (NORVASC ) 5 MG tablet, TAKE 1 TABLET DAILY, Disp: 90 tablet, Rfl: 1   diclofenac  Sodium (VOLTAREN ) 1 % GEL, Apply a small grape sized dollop to the left knee every 6 hours as needed. (Patient not taking: Reported on 06/29/2024), Disp: 150 g, Rfl: 1   Objective:     BP 128/80 (BP Location: Right Arm, Patient Position: Sitting, Cuff Size: Large)   Pulse 96   Temp 98.4 F (36.9 C) (Temporal)   Ht 5' 4 (1.626 m)   Wt 223 lb 3.2 oz (101.2 kg)   SpO2 97%   BMI 38.31 kg/m  BP Readings from Last 3 Encounters:  06/29/24 128/80  02/06/24 (!) 150/82  11/04/23 (!) 142/72   Wt Readings from Last 3 Encounters:  06/29/24 223 lb  3.2 oz (101.2 kg)  02/06/24 226 lb 12.8 oz (102.9 kg)  11/04/23 221 lb 3.2 oz (100.3 kg)      Physical Exam Constitutional:      General: She is not in acute distress.    Appearance: Normal appearance. She is not ill-appearing, toxic-appearing or diaphoretic.  HENT:     Head: Normocephalic and atraumatic.     Right Ear: External ear normal.     Left Ear: External ear normal.     Mouth/Throat:     Mouth: Mucous membranes are moist.     Pharynx: Oropharynx is clear. No oropharyngeal exudate or posterior oropharyngeal erythema.  Eyes:     General: No scleral icterus.       Right eye: No discharge.        Left eye: No discharge.     Extraocular Movements: Extraocular movements intact.     Conjunctiva/sclera: Conjunctivae normal.   Cardiovascular:     Rate and Rhythm: Normal rate and regular rhythm.  Pulmonary:     Effort: Pulmonary effort is normal. No respiratory distress.     Breath sounds: Normal breath sounds.  Abdominal:     General: Bowel sounds are normal.  Musculoskeletal:     Cervical back: No rigidity or tenderness.  Skin:    General: Skin is warm and dry.  Neurological:     Mental Status: She is alert and oriented to person, place, and time.  Psychiatric:        Mood and Affect: Mood normal.        Behavior: Behavior normal.      Results for orders placed or performed in visit on 06/29/24  Comprehensive metabolic panel with GFR  Result Value Ref Range   Sodium 138 135 - 145 mEq/L   Potassium 4.3 3.5 - 5.1 mEq/L   Chloride 105 96 - 112 mEq/L   CO2 27 19 - 32 mEq/L   Glucose, Bld 85 70 - 99 mg/dL   BUN 16 6 - 23 mg/dL   Creatinine, Ser 9.25 0.40 - 1.20 mg/dL   Total Bilirubin 0.3 0.2 - 1.2 mg/dL   Alkaline Phosphatase 83 39 - 117 U/L   AST 19 0 - 37 U/L   ALT 19 0 - 35 U/L   Total Protein 6.9 6.0 - 8.3 g/dL   Albumin 4.5 3.5 - 5.2 g/dL   GFR 16.42 >39.99 mL/min   Calcium  9.6 8.4 - 10.5 mg/dL  Hemoglobin J8r  Result Value Ref Range   Hgb A1c MFr Bld  6.9 (H) 4.6 - 6.5 %  The 10-year ASCVD risk score (Arnett DK, et al., 2019) is: 26.5%    Assessment & Plan:   Uncontrolled type 2 diabetes mellitus with hyperglycemia (HCC) -     Comprehensive metabolic panel with GFR -     Hemoglobin A1c -     Tirzepatide ; Inject 2.5 mg into the skin once a week.  Dispense: 2 mL; Refill: 3  BMI 38.0-38.9,adult  Immunization due -     Flu vaccine HIGH DOSE PF(Fluzone Trivalent)  Essential hypertension -     Comprehensive metabolic panel with GFR    Return in about 3 months (around 09/29/2024), or Please check with ophthalmology regarding use of a GLP-1 agonist..  Follow-up in 3 months if we are able to start a GLP-1 agonist, otherwise in 6 months. Discussed the common side effects of GLP-1 agonist including nausea vomiting constipation or diarrhea.  Discussed sarcopenia and the need for strength training.  Discussed severe abdominal pain needing emergent evaluation.  Elsie Sim Lent, MD  12/18: Sorry for the delay in getting back to you. I did talk with my eye doctor and he said there was no specific or special contraindication of putting me on the new anti-fat shot. I have no idea what the name of it is. Will start tirzepatide  2.5 mg weekly.  Patient has follow-up in February.

## 2024-06-30 ENCOUNTER — Ambulatory Visit: Payer: Self-pay | Admitting: Family Medicine

## 2024-07-02 ENCOUNTER — Other Ambulatory Visit: Payer: Self-pay | Admitting: Family Medicine

## 2024-07-02 DIAGNOSIS — Z1231 Encounter for screening mammogram for malignant neoplasm of breast: Secondary | ICD-10-CM

## 2024-07-06 ENCOUNTER — Inpatient Hospital Stay: Admission: RE | Admit: 2024-07-06

## 2024-07-09 ENCOUNTER — Other Ambulatory Visit: Payer: Self-pay | Admitting: Family Medicine

## 2024-07-09 DIAGNOSIS — I10 Essential (primary) hypertension: Secondary | ICD-10-CM

## 2024-08-03 ENCOUNTER — Encounter: Payer: Self-pay | Admitting: Family Medicine

## 2024-08-03 NOTE — Telephone Encounter (Signed)
 Called patient to ask about if she remember the medication name. Patient stated that it started with a T that is all I can remember for weight loss. I thanked her for taking my call and informed that I will pass this information to Dr. Berneta to review.

## 2024-08-06 MED ORDER — TIRZEPATIDE 2.5 MG/0.5ML ~~LOC~~ SOAJ: 2.5000 mg | SUBCUTANEOUS | 3 refills | Status: DC

## 2024-08-06 NOTE — Addendum Note (Signed)
 Addended by: BERNETA ELSIE LABOR on: 08/06/2024 09:21 AM   Modules accepted: Orders

## 2024-08-07 NOTE — Telephone Encounter (Signed)
 Called patient and informed her of Dr. Tresa comments and recommendations. Informed that the medication Tirzepatide  (Mounjaro ) 2.5 was sent to the [facility] pharmacy. Patient verbalized understanding and all (if any) questions were answered.

## 2024-08-10 ENCOUNTER — Telehealth: Payer: Self-pay

## 2024-08-10 ENCOUNTER — Ambulatory Visit: Payer: Self-pay

## 2024-08-10 DIAGNOSIS — E1165 Type 2 diabetes mellitus with hyperglycemia: Secondary | ICD-10-CM

## 2024-08-10 MED ORDER — TIRZEPATIDE 2.5 MG/0.5ML ~~LOC~~ SOAJ: 2.5000 mg | SUBCUTANEOUS | 3 refills | Status: AC

## 2024-08-10 MED ORDER — TIRZEPATIDE 2.5 MG/0.5ML ~~LOC~~ SOAJ: 2.5000 mg | SUBCUTANEOUS | 3 refills | Status: DC

## 2024-08-10 NOTE — Telephone Encounter (Signed)
 Noted. Dm/cma

## 2024-08-10 NOTE — Telephone Encounter (Signed)
 RX printed and will be signed tomorrow then faxed to [phone].  Dm/cma

## 2024-08-10 NOTE — Telephone Encounter (Signed)
 FYI Only or Action Required?: FYI only for provider: ED advised.  Patient was last seen in primary care on 06/29/2024 by Berneta Elsie Sayre, MD.  Called Nurse Triage reporting Dysuria and Urinary Frequency.  Symptoms began today.  Interventions attempted: Other: increasing fluids.  Symptoms are: unchanged.  Triage Disposition: Go to ED Now (Notify PCP)  Patient/caregiver understands and will follow disposition?: Yes   Reason for Disposition  [1] Unable to urinate (or only a few drops) > 4 hours AND [2] bladder feels very full (e.g., palpable bladder or strong urge to urinate)  Protocols used: Urination Pain - Female-A-AH     Pt out of town visting son reporrts urinary urgency with little to nothing coming out despite strong urgency to uriante and bladder feeling full hasn't urinated more than a few drops since ealier this am. Noted blood when wiping and on pad. This clinical research associate advised ED patient is agreeable and will go when son gets home in the 35mins-1 hours timeframe.       1. SEVERITY: How bad is the pain?  (e.g., Scale 1-10; mild, moderate, or severe)     5-6/10 electrical sensation with urinating  2. FREQUENCY: How many times have you had painful urination today?      6 times since soon to restroom only few drops or no urine. Last time urinating more than few drops earlier this am I can't remember   3. PATTERN: Is pain present every time you urinate or just sometimes?      Yes every time  4. ONSET: When did the painful urination start?      Today  5. FEVER: Do you have a fever? If Yes, ask: What is your temperature, how was it measured, and when did it start?     Denies   7. CAUSE: What do you think is causing the painful urination?  (e.g., UTI, scratch, Herpes sore)     Unsure  8. OTHER SYMPTOMS: Do you have any other symptoms? (e.g., blood in urine, flank pain, genital sores, urgency, vaginal discharge)     Blood in urine seen when wiping and on  pad., had urinary incontinence at baseline  Denies chest pain, shortness of breath,  flank pain    Copied from CRM #[id number]. Topic: Clinical - Red Word Triage >> Aug 10, 2024  2:17 PM Thersia BROCKS wrote: [facility] that prompted transfer to Nurse Triage: Patient called in stated she has been having blood in urine and sensation while trying to go, stated she has been feeling like she needs to go but nothing comes out

## 2024-08-19 ENCOUNTER — Other Ambulatory Visit: Payer: Self-pay | Admitting: Family Medicine

## 2024-08-19 DIAGNOSIS — E119 Type 2 diabetes mellitus without complications: Secondary | ICD-10-CM

## 2024-08-19 DIAGNOSIS — I10 Essential (primary) hypertension: Secondary | ICD-10-CM

## 2024-08-19 DIAGNOSIS — E7800 Pure hypercholesterolemia, unspecified: Secondary | ICD-10-CM

## 2024-08-19 NOTE — Telephone Encounter (Signed)
 Called and left a voice message to confirm which pharmacy patient's uses since some of the Rx's have been sent within the last month to a different pharmacy

## 2024-08-21 NOTE — Telephone Encounter (Signed)
 Patient called in stating that she uses   [facility] - [place], [place] - [address] AT Portal to Registered [employer] Sites Phone: [phone]  Fax: [phone]

## 2024-08-21 NOTE — Telephone Encounter (Signed)
 Requesting: EZETIMIBE  TAB 10MG  , METFORMIN  TAB 500MG  , CANDESARTAN   TAB 32MG  , ATORVASTATIN  TAB 80MG   Last Visit: 06/29/2024 Next Visit: 09/29/2024 Last Refill: 12/19/23, 02/06/24, 05/25/24, 4/28/205  Please Advise

## 2024-09-10 ENCOUNTER — Ambulatory Visit: Payer: Self-pay

## 2024-09-10 ENCOUNTER — Ambulatory Visit: Admitting: Family Medicine

## 2024-09-10 ENCOUNTER — Encounter: Payer: Self-pay | Admitting: Family Medicine

## 2024-09-10 VITALS — BP 138/78 | HR 100 | Temp 98.0°F | Ht 64.0 in | Wt 226.6 lb

## 2024-09-10 DIAGNOSIS — J3089 Other allergic rhinitis: Secondary | ICD-10-CM

## 2024-09-10 DIAGNOSIS — H6993 Unspecified Eustachian tube disorder, bilateral: Secondary | ICD-10-CM

## 2024-09-10 DIAGNOSIS — H8309 Labyrinthitis, unspecified ear: Secondary | ICD-10-CM

## 2024-09-10 MED ORDER — PREDNISONE 10 MG PO TABS: 10.0000 mg | ORAL_TABLET | Freq: Every day | ORAL | 0 refills | Status: AC

## 2024-09-10 MED ORDER — MECLIZINE HCL 25 MG PO TABS: 25.0000 mg | ORAL_TABLET | Freq: Three times a day (TID) | ORAL | 0 refills | Status: AC | PRN

## 2024-09-10 MED ORDER — FLUTICASONE PROPIONATE 50 MCG/ACT NA SUSP: 2.0000 | Freq: Every day | NASAL | 6 refills | Status: AC

## 2024-09-10 NOTE — Progress Notes (Signed)
 "  Established Patient Office Visit   Subjective:  Patient ID: Paula Kelly, female    DOB: 1956-03-04  Age: 69 y.o. MRN: [id number]  Chief Complaint  Patient presents with   Acute Visit    Vertigo ongoing for 10 days. Patient just had dental procedure today.     HPI Encounter Diagnoses  Name Primary?   Non-seasonal allergic rhinitis, unspecified trigger Yes   Dysfunction of both eustachian tubes    Labyrinthitis, unspecified laterality    7 to 10-day history of a spinning sensation associated with sudden change in head position.  She denies headache, difficulty swallowing, unilateral weakness or paresthesias.  Ongoing nasal congestion, ear congestion and postnasal drip.  Has been using Flonase  intermittently.  The symptoms are pretty much year-round for her.  Status post allergy workup.   Review of Systems  Constitutional: Negative.   HENT:  Positive for congestion. Negative for ear pain.   Eyes:  Negative for blurred vision, discharge and redness.  Respiratory: Negative.    Cardiovascular: Negative.   Gastrointestinal:  Negative for abdominal pain.  Genitourinary: Negative.   Musculoskeletal: Negative.  Negative for myalgias.  Skin:  Negative for rash.  Neurological:  Positive for dizziness (spinning). Negative for tingling, loss of consciousness, weakness and headaches.  Endo/Heme/Allergies:  Negative for polydipsia.    Current Medications[1]   Objective:     BP 138/78   Pulse 100   Temp 98 F (36.7 C)   Ht 5' 4 (1.626 m)   Wt 226 lb 9.6 oz (102.8 kg)   SpO2 97%   BMI 38.90 kg/m    Physical Exam Constitutional:      General: She is not in acute distress.    Appearance: Normal appearance. She is not ill-appearing, toxic-appearing or diaphoretic.  HENT:     Head: Normocephalic and atraumatic.     Right Ear: Ear canal and external ear normal. Tympanic membrane is retracted.     Left Ear: Ear canal and external ear normal. Tympanic membrane is retracted.      Mouth/Throat:     Mouth: Mucous membranes are moist.     Pharynx: Oropharynx is clear. No oropharyngeal exudate or posterior oropharyngeal erythema.  Eyes:     General: No scleral icterus.       Right eye: No discharge.        Left eye: No discharge.     Extraocular Movements: Extraocular movements intact.     Conjunctiva/sclera: Conjunctivae normal.     Pupils: Pupils are equal, round, and reactive to light.  Cardiovascular:     Rate and Rhythm: Normal rate and regular rhythm.  Pulmonary:     Effort: Pulmonary effort is normal. No respiratory distress.     Breath sounds: Normal breath sounds.  Abdominal:     General: Bowel sounds are normal.     Tenderness: There is no abdominal tenderness. There is no guarding.  Musculoskeletal:     Cervical back: No rigidity or tenderness.  Skin:    General: Skin is warm and dry.  Neurological:     Mental Status: She is alert and oriented to person, place, and time.     Cranial Nerves: No cranial nerve deficit, dysarthria or facial asymmetry.     Motor: Motor function is intact.     Comments: Cranial nerves II through XII essentially intact.  Visual field testing by confrontation did not show a deficit in her left upper visual field.  Recent history of retinal detachment involving  OS.  Sensory intact to light touch except right V2.  Status post dental procedure using Novocain earlier today.SABRA  Psychiatric:        Mood and Affect: Mood normal.        Behavior: Behavior normal.      No results found for any visits on 09/10/24.    The 10-year ASCVD risk score (Arnett DK, et al., 2019) is: 18.3%    Assessment & Plan:   Non-seasonal allergic rhinitis, unspecified trigger -     Fluticasone  Propionate; Place 2 sprays into both nostrils daily.  Dispense: 16 g; Refill: 6 -     predniSONE ; Take 1 tablet (10 mg total) by mouth daily with breakfast for 7 days.  Dispense: 7 tablet; Refill: 0  Dysfunction of both eustachian tubes -      predniSONE ; Take 1 tablet (10 mg total) by mouth daily with breakfast for 7 days.  Dispense: 7 tablet; Refill: 0  Labyrinthitis, unspecified laterality -     Meclizine  HCl; Take 1 tablet (25 mg total) by mouth 3 (three) times daily as needed.  Dispense: 30 tablet; Refill: 0    Return if symptoms worsen or fail to improve.  Please use Flonase  daily and consistently.  Brief course of low-dose prednisone .  Information given on ETD.    Elsie Sim Lent, MD    [1]  Current Outpatient Medications:    amLODipine  (NORVASC ) 5 MG tablet, TAKE 1 TABLET DAILY, Disp: 90 tablet, Rfl: 1   amphetamine -dextroamphetamine (ADDERALL XR) 30 MG 24 hr capsule, Will start this prescription once the 20 mg prescription has run out., Disp: , Rfl:    Ascorbic Acid (VITAMIN C) 100 MG tablet, Take 100 mg by mouth daily., Disp: , Rfl:    aspirin  EC 81 MG tablet, Take 1 tablet (81 mg total) by mouth daily., Disp: 365 tablet, Rfl: 0   atorvastatin  (LIPITOR) 80 MG tablet, TAKE 1 TABLET DAILY, Disp: 90 tablet, Rfl: 0   buPROPion (WELLBUTRIN XL) 150 MG 24 hr tablet, Take 1 tablet by mouth daily., Disp: , Rfl:    candesartan  (ATACAND ) 32 MG tablet, TAKE 1 TABLET DAILY, Disp: 30 tablet, Rfl: 0   Cholecalciferol  (VITAMIN D3) 50 MCG (2000 UT) TABS, Take 1 tablet by mouth daily., Disp: 90 tablet, Rfl: 2   Coenzyme Q10 200 MG capsule, , Disp: , Rfl:    Cyanocobalamin (VITAMIN B 12 PO), Take by mouth., Disp: , Rfl:    DULoxetine  (CYMBALTA ) 60 MG capsule, Take 2 capsules (120 mg total) by mouth daily., Disp: 180 capsule, Rfl: 1   ezetimibe  (ZETIA ) 10 MG tablet, TAKE 1 TABLET DAILY, Disp: 90 tablet, Rfl: 0   fish oil-omega-3 fatty acids 1000 MG capsule, Take by mouth., Disp: , Rfl:    fluticasone  (FLONASE ) 50 MCG/ACT nasal spray, Place 2 sprays into both nostrils daily., Disp: 16 g, Rfl: 6   folic acid  (FOLVITE ) 1 MG tablet, Take 1 mg by mouth daily., Disp: , Rfl:    glucose blood (FREESTYLE LITE) test strip, Use to test 1-2  times daily., Disp: 100 each, Rfl: 5   meclizine  (ANTIVERT ) 25 MG tablet, Take 1 tablet (25 mg total) by mouth 3 (three) times daily as needed., Disp: 30 tablet, Rfl: 0   metFORMIN  (GLUCOPHAGE ) 500 MG tablet, TAKE 1 TABLET TWICE DAILY  WITH MEALS, Disp: 180 tablet, Rfl: 0   Multiple Vitamin (MULTI-VITAMINS) TABS, Take 1 tablet by mouth daily., Disp: , Rfl:    predniSONE  (DELTASONE ) 10 MG tablet,  Take 1 tablet (10 mg total) by mouth daily with breakfast for 7 days., Disp: 7 tablet, Rfl: 0   QUEtiapine (SEROQUEL) 25 MG tablet, Take 25 mg by mouth at bedtime. Takes 12.5 mg, Disp: , Rfl:    tirzepatide  (MOUNJARO ) 2.5 MG/0.5ML Pen, Inject 2.5 mg into the skin once a week., Disp: 2 mL, Rfl: 3   Vibegron (GEMTESA) 75 MG TABS, Take 75 mg by mouth daily in the afternoon., Disp: , Rfl:    vitamin E 400 UNIT capsule, Take 1 capsule by mouth daily., Disp: , Rfl:    diclofenac  Sodium (VOLTAREN ) 1 % GEL, Apply a small grape sized dollop to the left knee every 6 hours as needed. (Patient not taking: Reported on 09/10/2024), Disp: 150 g, Rfl: 1  "

## 2024-09-10 NOTE — Telephone Encounter (Signed)
 This RN attempted x1 to contact patient, no answer, left voicemail message.

## 2024-09-10 NOTE — Telephone Encounter (Signed)
 FYI Only or Action Required?: FYI only for provider: appointment scheduled on 09/10/24.  Patient was last seen in primary care on 06/29/2024 by Berneta Elsie Sayre, MD.  Called Nurse Triage reporting dizziness/vertigo.  Symptoms began 7 to 10 days ago.  Interventions attempted: Nothing.  Symptoms are: stable.  Triage Disposition: See PCP When Office is Open (Within 3 Days), Duplicate Contact Calls  Patient/caregiver understands and will follow disposition?: yes    Message from [place] E sent at 09/10/2024  9:53 AM EST       Summary: Severe dizziness     Reason for Triage: Severe dizziness going on for the past week, patient feels as if the room is spinning.         Reason for Disposition  [1] MILD dizziness (e.g., walking normally) AND [2] has NOT been evaluated by doctor (or NP/PA) for this  (Exception: Dizziness caused by heat exposure, sudden standing, or poor fluid intake.)    Pt has vertigo and dizziness  Answer Assessment - Initial Assessment Questions 1. DESCRIPTION: Describe your dizziness.     woozy 2. LIGHTHEADED: Do you feel lightheaded? (e.g., somewhat faint, woozy, weak upon standing)     Occasionally woozy 3. VERTIGO: Do you feel like either you or the room is spinning or tilting? (i.e., vertigo)     Room spinning  4. SEVERITY: How bad is it?  Do you feel like you are going to faint? Can you stand and walk?     Yes can walk /does not feel faint  5. ONSET:  When did the dizziness begin?     X 1 week  6. AGGRAVATING FACTORS: Does anything make it worse? (e.g., standing, change in head position)     Standing up from prone position, turning head to the left while in bed  7. HEART RATE: Can you tell me your heart rate? How many beats in 15 seconds?  (Note: Not all patients can do this.)       104 8. CAUSE: What do you think is causing the dizziness? (e.g., decreased fluids or food, diarrhea, emotional distress, heat exposure, new  medicine, sudden standing, vomiting; unknown)     Sudden standing  9. RECURRENT SYMPTOM: Have you had dizziness before? If Yes, ask: When was the last time? What happened that time?     Not since college in 1977 10. OTHER SYMPTOMS: Do you have any other symptoms? (e.g., fever, chest pain, vomiting, diarrhea, bleeding)       no 11. PREGNANCY: Is there any chance you are pregnant? When was your last menstrual period?       N/a  Protocols used: Dizziness - Lightheadedness-A-AH

## 2024-09-10 NOTE — Telephone Encounter (Signed)
 Unable to reach patient for triage x 3 attempts.

## 2024-09-10 NOTE — Telephone Encounter (Signed)
 Message from [place] E sent at 09/10/2024  9:53 AM EST  Summary: Severe dizziness   Reason for Triage: Severe dizziness going on for the past week, patient feels as if the room is spinning.

## 2024-09-10 NOTE — Telephone Encounter (Deleted)
 Per PAS, wanted call after 11 d/t dentist visit. Will place in CB for additional attempts.

## 2024-09-14 ENCOUNTER — Other Ambulatory Visit: Payer: Self-pay | Admitting: Family Medicine

## 2024-09-14 DIAGNOSIS — E119 Type 2 diabetes mellitus without complications: Secondary | ICD-10-CM

## 2024-09-14 DIAGNOSIS — I10 Essential (primary) hypertension: Secondary | ICD-10-CM

## 2024-09-29 ENCOUNTER — Ambulatory Visit: Admitting: Family Medicine

## 2024-10-30 ENCOUNTER — Ambulatory Visit

## 2024-11-03 ENCOUNTER — Ambulatory Visit
